# Patient Record
Sex: Female | Born: 1935
Health system: Southern US, Community
[De-identification: ages and names within clinical notes are randomized; demographics above are authoritative.]

## PROBLEM LIST (undated history)

## (undated) DIAGNOSIS — R0601 Orthopnea: Secondary | ICD-10-CM

## (undated) DIAGNOSIS — C801 Malignant (primary) neoplasm, unspecified: Secondary | ICD-10-CM

## (undated) DIAGNOSIS — J45909 Unspecified asthma, uncomplicated: Secondary | ICD-10-CM

## (undated) DIAGNOSIS — I10 Essential (primary) hypertension: Secondary | ICD-10-CM

## (undated) DIAGNOSIS — R42 Dizziness and giddiness: Secondary | ICD-10-CM

## (undated) DIAGNOSIS — M1990 Unspecified osteoarthritis, unspecified site: Secondary | ICD-10-CM

## (undated) DIAGNOSIS — I639 Cerebral infarction, unspecified: Secondary | ICD-10-CM

## (undated) HISTORY — PX: KNEE ARTHROSCOPY: SUR90

## (undated) HISTORY — PX: MELANOMA EXCISION: SHX5266

## (undated) HISTORY — PX: MOUTH SURGERY: SHX715

## (undated) HISTORY — PX: UTERINE SUSPENSION: SUR1430

## (undated) HISTORY — PX: CATARACT EXTRACTION: SUR2

## (undated) HISTORY — PX: REPLACEMENT TOTAL KNEE BILATERAL: SUR1225

---

## 2004-06-21 ENCOUNTER — Ambulatory Visit: Payer: Self-pay | Admitting: Family Medicine

## 2004-07-27 ENCOUNTER — Ambulatory Visit: Payer: Self-pay | Admitting: Family Medicine

## 2005-09-15 ENCOUNTER — Ambulatory Visit: Payer: Self-pay | Admitting: Family Medicine

## 2006-10-10 ENCOUNTER — Ambulatory Visit: Payer: Self-pay | Admitting: Family Medicine

## 2007-11-19 ENCOUNTER — Ambulatory Visit: Payer: Self-pay

## 2007-11-20 ENCOUNTER — Ambulatory Visit: Payer: Self-pay | Admitting: Family Medicine

## 2007-12-06 ENCOUNTER — Ambulatory Visit: Payer: Self-pay | Admitting: General Practice

## 2007-12-06 ENCOUNTER — Other Ambulatory Visit: Payer: Self-pay

## 2007-12-13 ENCOUNTER — Ambulatory Visit: Payer: Self-pay | Admitting: General Practice

## 2008-11-26 ENCOUNTER — Ambulatory Visit: Payer: Self-pay | Admitting: Family Medicine

## 2008-12-09 ENCOUNTER — Ambulatory Visit: Payer: Self-pay | Admitting: Family Medicine

## 2009-06-15 ENCOUNTER — Ambulatory Visit: Payer: Self-pay | Admitting: Family Medicine

## 2010-02-15 IMAGING — US ULTRASOUND LEFT BREAST
1 series · 17 of 23 positions shown · non-contrast
Comparison: none

REASON FOR EXAM: left breast asymmetric nodular density  US
COMMENTS:

[Series 1: ultrasound left breast · 17 of 23 slices shown]
[im 1/23]
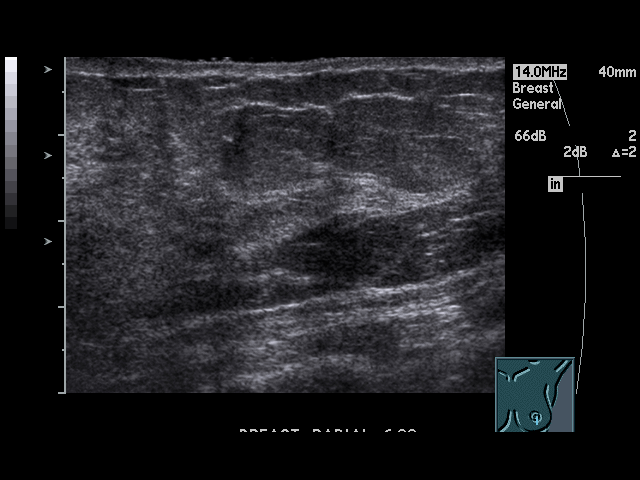
[im 3/23]
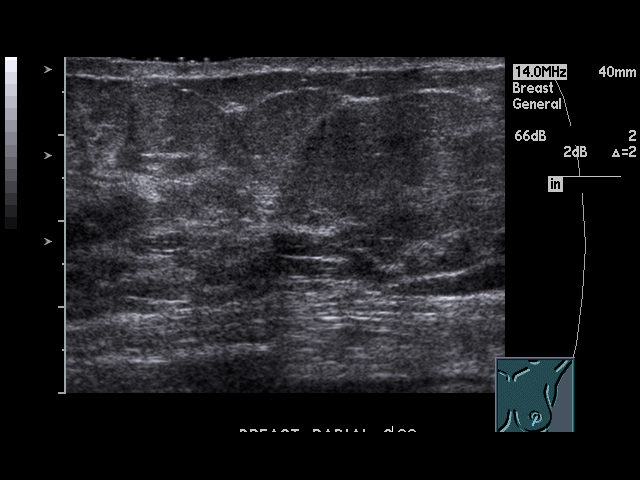
[im 4/23]
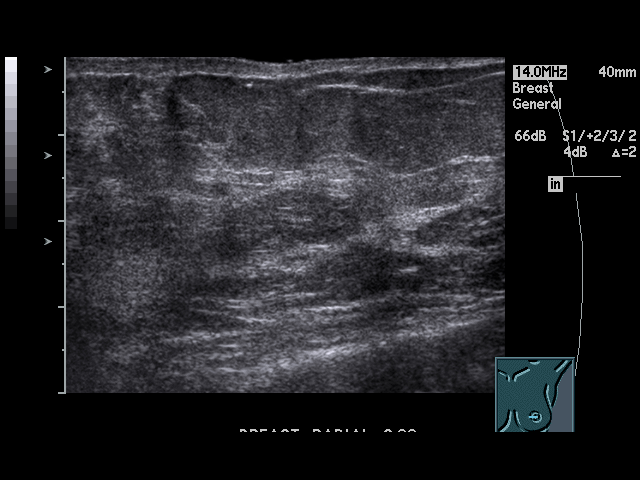
[im 5/23]
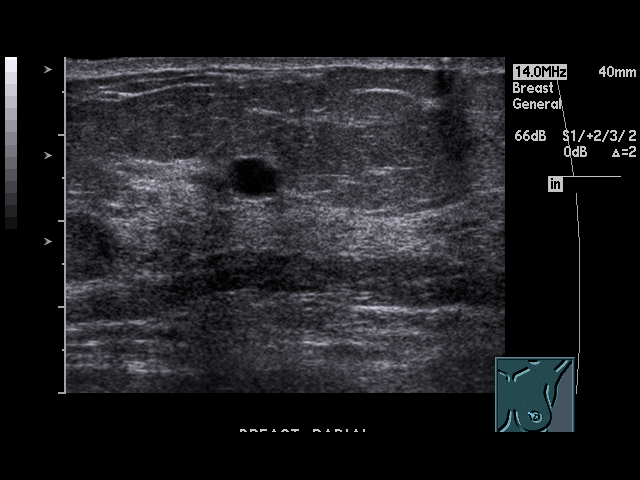
[im 7/23]
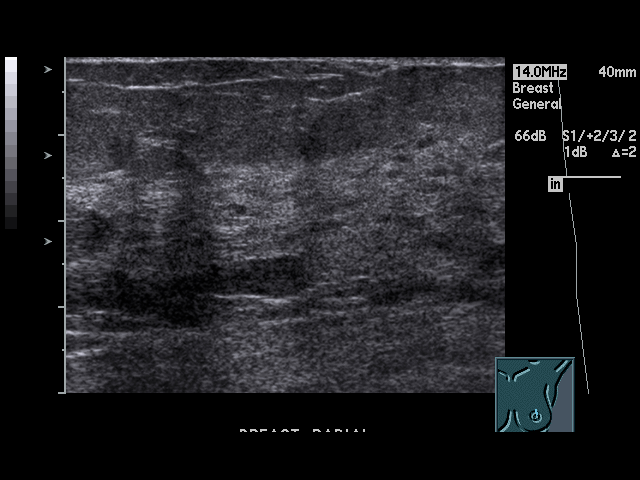
[im 8/23]
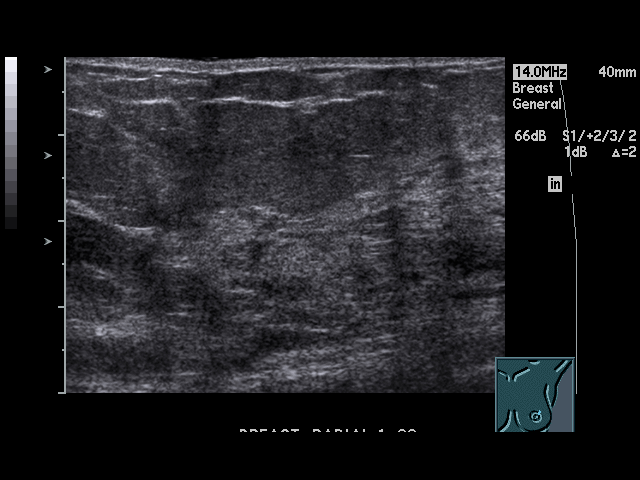
[im 9/23]
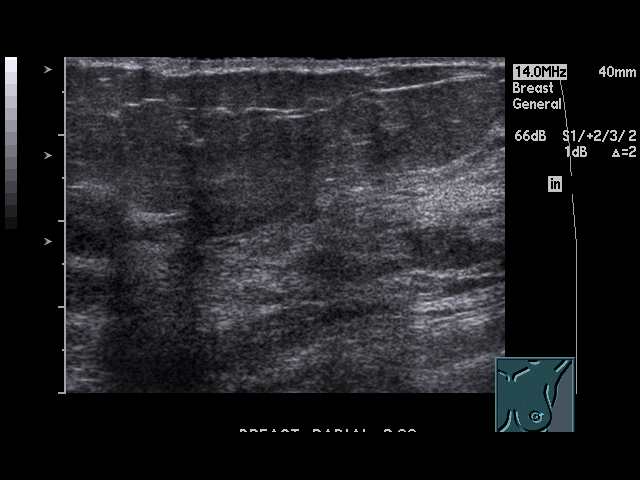
[im 11/23]
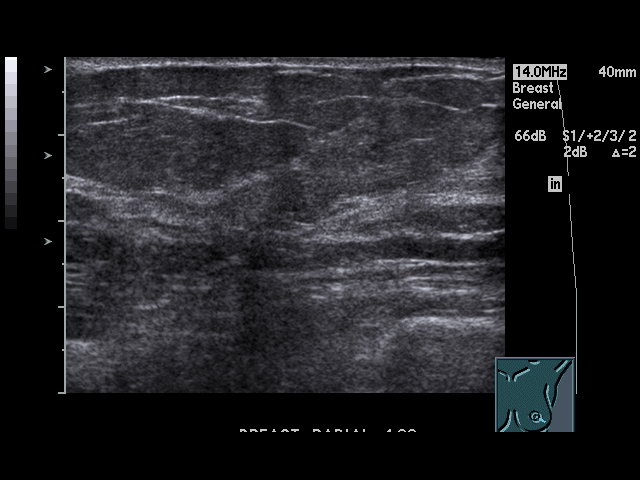
[im 12/23]
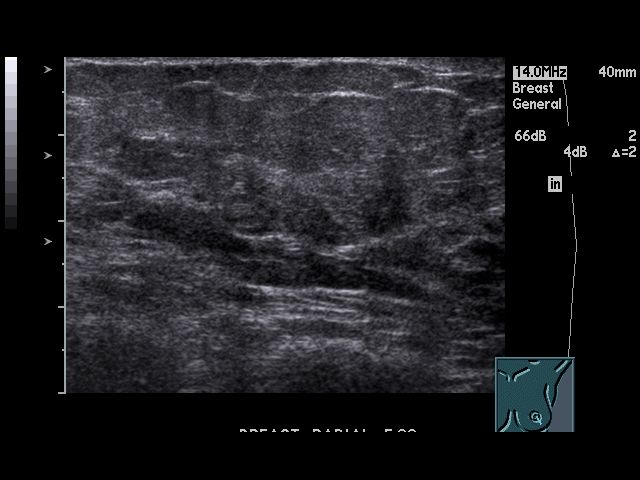
[im 13/23]
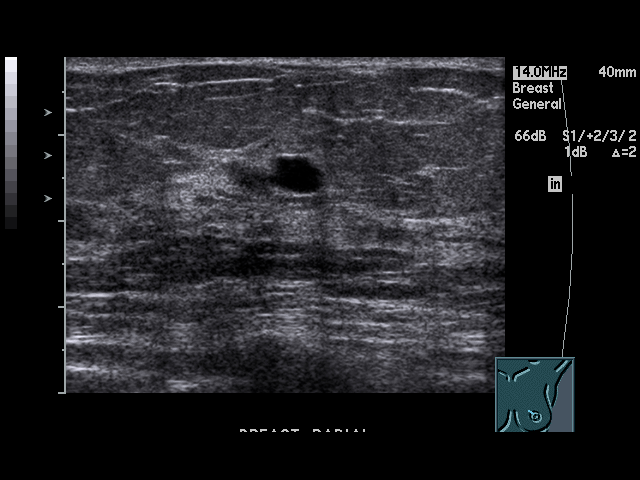
[im 15/23]
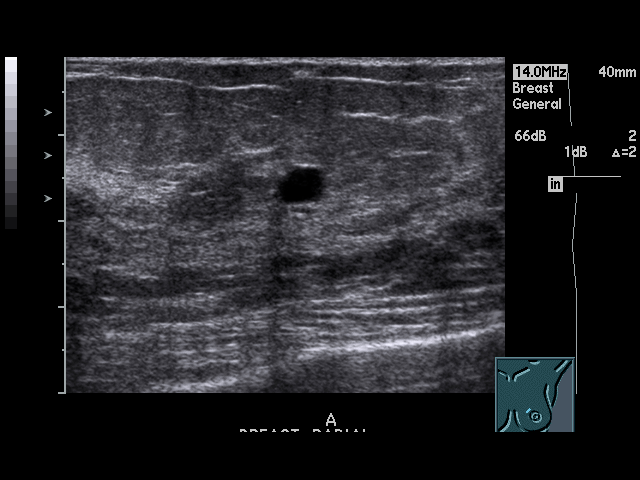
[im 16/23]
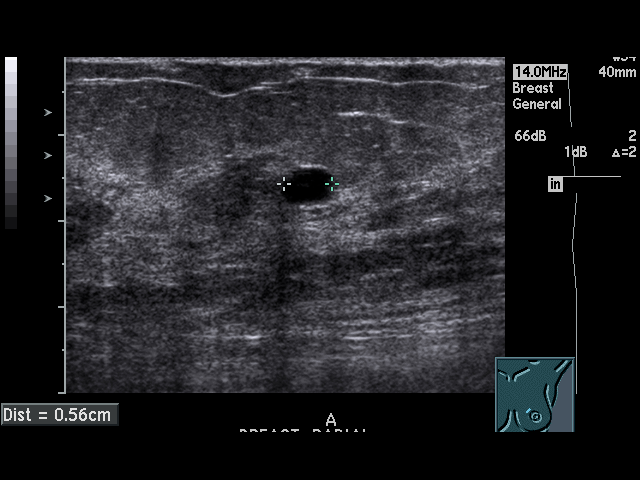
[im 17/23]
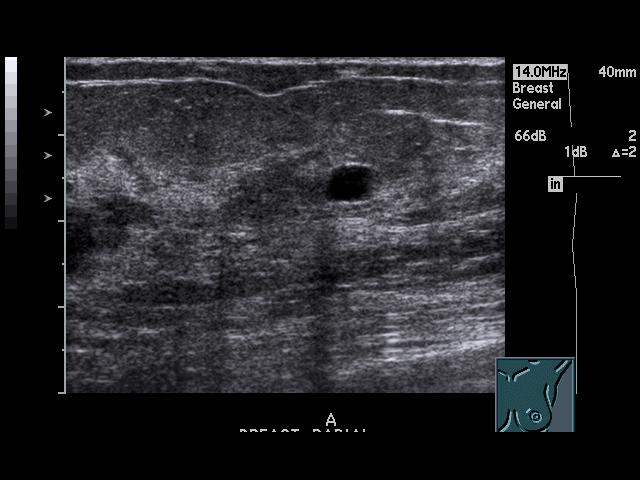
[im 19/23]
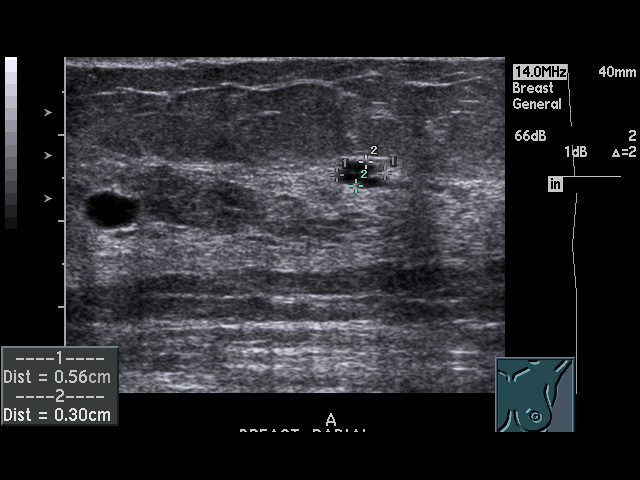
[im 20/23]
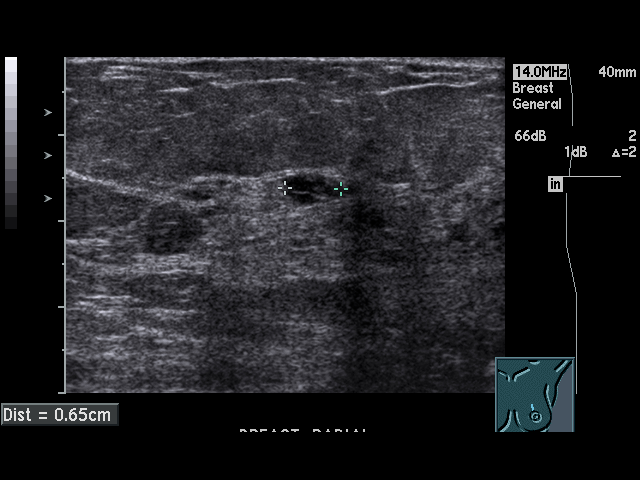
[im 21/23]
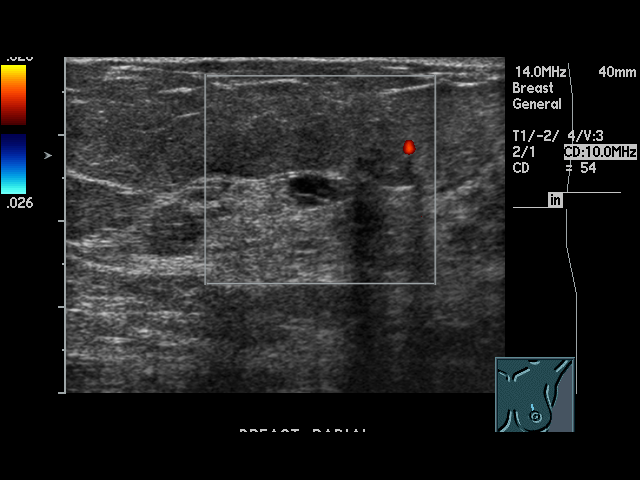
[im 23/23]
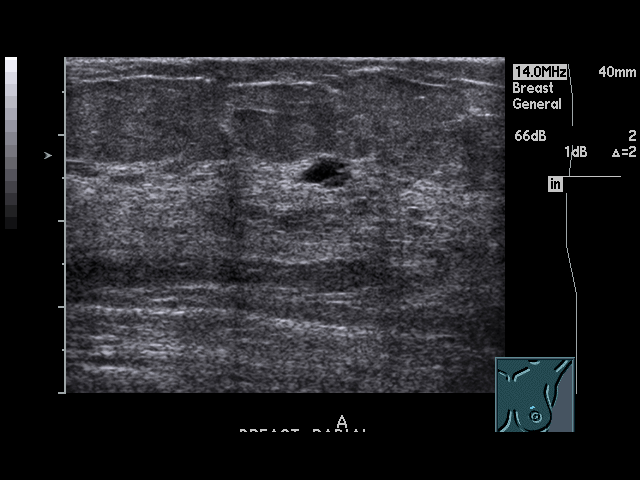

[17 of 23 positions shown; findings below may reference images not displayed]

PROCEDURE:     US  - US BREAST LEFT  - December 09, 2008 [DATE]

RESULT:       The left breast was evaluated in the region of interest from
the periareolar region.

At the 10 o'clock position, a benign-appearing cyst is identified with
increased through transmission and an imperceptible wall measuring 5.7 x
mm. A second indeterminate cystic-appearing nodule is identified at the
10:30/11 o'clock position.  This has increased through transmission and a
regularly shaped wall with a linear area of increased echogenicity.  This
may represent a complex cyst or possibly a septated cyst and a 6-month
re-evaluation at the 10 to 11 o'clock position with ultrasound is
recommended.
IMPRESSION: 1.      Benign cyst at the 12 o'clock position.
2.      Indeterminate likely benign cyst at the 10 to 11 o'clock position.
6-month reevaluation with ultrasound recommended. Please refer to the
additional radiographic view dictation for complete discussion.

## 2010-02-24 ENCOUNTER — Ambulatory Visit: Payer: Self-pay | Admitting: Family Medicine

## 2010-11-15 ENCOUNTER — Ambulatory Visit: Payer: Self-pay | Admitting: Family Medicine

## 2011-04-05 ENCOUNTER — Ambulatory Visit: Payer: Self-pay | Admitting: Family Medicine

## 2011-07-04 ENCOUNTER — Ambulatory Visit: Payer: Self-pay | Admitting: Neurology

## 2011-10-19 ENCOUNTER — Emergency Department: Payer: Self-pay | Admitting: Emergency Medicine

## 2011-10-19 LAB — COMPREHENSIVE METABOLIC PANEL
Albumin: 3.9 g/dL (ref 3.4–5.0)
Anion Gap: 10 (ref 7–16)
Calcium, Total: 8.9 mg/dL (ref 8.5–10.1)
Chloride: 106 mmol/L (ref 98–107)
Co2: 27 mmol/L (ref 21–32)
Creatinine: 0.92 mg/dL (ref 0.60–1.30)
EGFR (African American): >60
Osmolality: 285 (ref 275–301)
Potassium: 3.5 mmol/L (ref 3.5–5.1)
SGOT(AST): 24 U/L (ref 15–37)
Sodium: 143 mmol/L (ref 136–145)

## 2011-10-19 LAB — CBC
HCT: 40.8 % (ref 35.0–47.0)
HGB: 13.8 g/dL (ref 12.0–16.0)
MCH: 30 pg (ref 26.0–34.0)
MCHC: 33.8 g/dL (ref 32.0–36.0)
Platelet: 182 10*3/uL (ref 150–440)
RDW: 12.5 % (ref 11.5–14.5)

## 2011-10-31 ENCOUNTER — Ambulatory Visit: Payer: Self-pay | Admitting: Neurology

## 2012-01-30 ENCOUNTER — Ambulatory Visit: Payer: Self-pay | Admitting: General Practice

## 2012-01-30 LAB — PROTIME-INR
INR: 0.9
Prothrombin Time: 12.9 secs (ref 11.5–14.7)

## 2012-01-30 LAB — URINALYSIS, COMPLETE
Ketone: NEGATIVE
Nitrite: NEGATIVE
Ph: 5 (ref 4.5–8.0)
Protein: NEGATIVE
RBC,UR: 1 /HPF (ref 0–5)
Specific Gravity: 1.015 (ref 1.003–1.030)
WBC UR: 5 /HPF (ref 0–5)

## 2012-01-30 LAB — BASIC METABOLIC PANEL
BUN: 31 mg/dL — ABNORMAL HIGH (ref 7–18)
Chloride: 104 mmol/L (ref 98–107)
Co2: 29 mmol/L (ref 21–32)
Creatinine: 1.47 mg/dL — ABNORMAL HIGH (ref 0.60–1.30)
Glucose: 95 mg/dL (ref 65–99)
Osmolality: 284 (ref 275–301)
Potassium: 3.2 mmol/L — ABNORMAL LOW (ref 3.5–5.1)
Sodium: 139 mmol/L (ref 136–145)

## 2012-01-30 LAB — CBC
HCT: 37.7 % (ref 35.0–47.0)
Platelet: 191 10*3/uL (ref 150–440)
RDW: 13 % (ref 11.5–14.5)
WBC: 6.4 10*3/uL (ref 3.6–11.0)

## 2012-01-30 LAB — MRSA PCR SCREENING

## 2012-01-30 LAB — SEDIMENTATION RATE: Erythrocyte Sed Rate: 39 mm/hr — ABNORMAL HIGH (ref 0–30)

## 2012-02-09 ENCOUNTER — Ambulatory Visit: Payer: Self-pay | Admitting: Family Medicine

## 2012-02-13 ENCOUNTER — Inpatient Hospital Stay: Payer: Self-pay

## 2012-02-13 LAB — POTASSIUM: Potassium: 3.2 mmol/L — ABNORMAL LOW (ref 3.5–5.1)

## 2012-02-14 LAB — PLATELET COUNT: Platelet: 142 10*3/uL — ABNORMAL LOW (ref 150–440)

## 2012-02-14 LAB — BASIC METABOLIC PANEL
Anion Gap: 9 (ref 7–16)
BUN: 15 mg/dL (ref 7–18)
Chloride: 102 mmol/L (ref 98–107)
Creatinine: 1.1 mg/dL (ref 0.60–1.30)
EGFR (Non-African Amer.): 49 — ABNORMAL LOW
Glucose: 147 mg/dL — ABNORMAL HIGH (ref 65–99)
Osmolality: 281 (ref 275–301)
Potassium: 3.4 mmol/L — ABNORMAL LOW (ref 3.5–5.1)
Sodium: 139 mmol/L (ref 136–145)

## 2012-02-15 LAB — BASIC METABOLIC PANEL
Calcium, Total: 8.1 mg/dL — ABNORMAL LOW (ref 8.5–10.1)
Chloride: 105 mmol/L (ref 98–107)
Co2: 26 mmol/L (ref 21–32)
Creatinine: 1.09 mg/dL (ref 0.60–1.30)
EGFR (Non-African Amer.): 49 — ABNORMAL LOW
Glucose: 129 mg/dL — ABNORMAL HIGH (ref 65–99)
Osmolality: 283 (ref 275–301)
Potassium: 3.7 mmol/L (ref 3.5–5.1)
Sodium: 141 mmol/L (ref 136–145)

## 2012-02-15 LAB — PLATELET COUNT: Platelet: 140 10*3/uL — ABNORMAL LOW (ref 150–440)

## 2012-02-15 LAB — HEMOGLOBIN: HGB: 11 g/dL — ABNORMAL LOW (ref 12.0–16.0)

## 2012-02-17 ENCOUNTER — Encounter: Payer: Self-pay | Admitting: Internal Medicine

## 2012-02-23 LAB — BASIC METABOLIC PANEL
Calcium, Total: 9.3 mg/dL (ref 8.5–10.1)
Chloride: 101 mmol/L (ref 98–107)
Co2: 31 mmol/L (ref 21–32)
Creatinine: 1.2 mg/dL (ref 0.60–1.30)
EGFR (Non-African Amer.): 44 — ABNORMAL LOW
Glucose: 87 mg/dL (ref 65–99)
Potassium: 3.3 mmol/L — ABNORMAL LOW (ref 3.5–5.1)
Sodium: 143 mmol/L (ref 136–145)

## 2012-03-01 ENCOUNTER — Encounter: Payer: Self-pay | Admitting: Internal Medicine

## 2013-05-14 ENCOUNTER — Ambulatory Visit: Payer: Self-pay | Admitting: Family Medicine

## 2013-06-12 ENCOUNTER — Ambulatory Visit: Payer: Self-pay | Admitting: Family Medicine

## 2014-01-01 ENCOUNTER — Ambulatory Visit: Payer: Self-pay | Admitting: Ophthalmology

## 2014-07-11 LAB — LIPID PANEL
Cholesterol: 179 mg/dL (ref 0–200)
HDL: 34 mg/dL — AB (ref 35–70)
LDL Cholesterol: 91 mg/dL
LDl/HDL Ratio: 2.7
TRIGLYCERIDES: 270 mg/dL — AB (ref 40–160)

## 2014-07-11 LAB — CBC AND DIFFERENTIAL
HCT: 41 % (ref 36–46)
HEMOGLOBIN: 13.7 g/dL (ref 12.0–16.0)
Neutrophils Absolute: 4 /uL
Platelets: 189 10*3/uL (ref 150–399)
WBC: 7 10*3/mL

## 2014-07-11 LAB — BASIC METABOLIC PANEL
BUN: 21 mg/dL (ref 4–21)
CREATININE: 1.2 mg/dL — AB (ref 0.5–1.1)
Glucose: 88 mg/dL
POTASSIUM: 3.7 mmol/L (ref 3.4–5.3)
Sodium: 144 mmol/L (ref 137–147)

## 2014-07-11 LAB — HEPATIC FUNCTION PANEL
ALK PHOS: 82 U/L (ref 25–125)
ALT: 16 U/L (ref 7–35)
AST: 18 U/L (ref 13–35)
BILIRUBIN, TOTAL: 0.4 mg/dL

## 2014-07-16 ENCOUNTER — Ambulatory Visit: Payer: Self-pay | Admitting: General Practice

## 2014-07-16 LAB — URINALYSIS, COMPLETE
BILIRUBIN, UR: NEGATIVE
Bacteria: NONE SEEN
Blood: NEGATIVE
GLUCOSE, UR: NEGATIVE mg/dL (ref 0–75)
KETONE: NEGATIVE
Nitrite: POSITIVE
PROTEIN: NEGATIVE
Ph: 5 (ref 4.5–8.0)
RBC,UR: 2 /HPF (ref 0–5)
Specific Gravity: 1.019 (ref 1.003–1.030)

## 2014-07-16 LAB — CBC
HCT: 42.8 % (ref 35.0–47.0)
HGB: 13.9 g/dL (ref 12.0–16.0)
MCH: 28.9 pg (ref 26.0–34.0)
MCHC: 32.5 g/dL (ref 32.0–36.0)
MCV: 89 fL (ref 80–100)
Platelet: 179 10*3/uL (ref 150–440)
RBC: 4.83 10*6/uL (ref 3.80–5.20)
RDW: 13.3 % (ref 11.5–14.5)
WBC: 7.2 10*3/uL (ref 3.6–11.0)

## 2014-07-16 LAB — APTT: ACTIVATED PTT: 28.6 s (ref 23.6–35.9)

## 2014-07-16 LAB — BASIC METABOLIC PANEL
Anion Gap: 7 (ref 7–16)
BUN: 17 mg/dL (ref 7–18)
CALCIUM: 9.4 mg/dL (ref 8.5–10.1)
Chloride: 104 mmol/L (ref 98–107)
Co2: 30 mmol/L (ref 21–32)
Creatinine: 1.14 mg/dL (ref 0.60–1.30)
EGFR (African American): 59 — ABNORMAL LOW
EGFR (Non-African Amer.): 49 — ABNORMAL LOW
Glucose: 95 mg/dL (ref 65–99)
Osmolality: 283 (ref 275–301)
Potassium: 3.5 mmol/L (ref 3.5–5.1)
Sodium: 141 mmol/L (ref 136–145)

## 2014-07-16 LAB — PROTIME-INR
INR: 1
Prothrombin Time: 12.6 secs (ref 11.5–14.7)

## 2014-07-16 LAB — MRSA PCR SCREENING

## 2014-07-16 LAB — SEDIMENTATION RATE: Erythrocyte Sed Rate: 28 mm/hr (ref 0–30)

## 2014-07-18 LAB — URINE CULTURE

## 2014-07-21 IMAGING — MG MM DIGITAL SCREENING BILAT W/ CAD
1 series · 6 of 6 positions shown · non-contrast
Comparison: Previous exam(s).

REASON FOR EXAM: SCR MAMMO NO ORDER
COMMENTS:

PROCEDURE:     MAM - MAM DGTL SCRN MAM NO ORDER W/CAD  - May 14, 2013 [DATE]
CLINICAL DATA: Screening.
EXAM:
DIGITAL SCREENING BILATERAL MAMMOGRAM WITH CAD

[R CC · right · 6 of 6 slices shown]
[im 1/6]
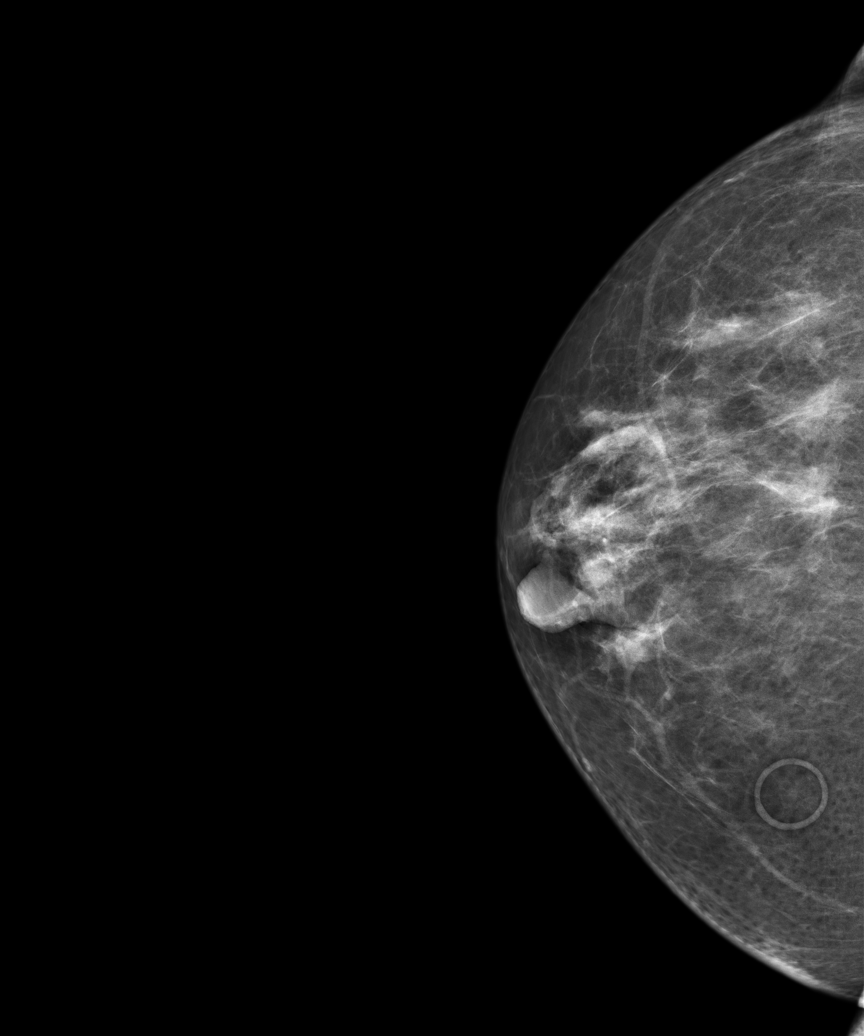
[im 2/6]
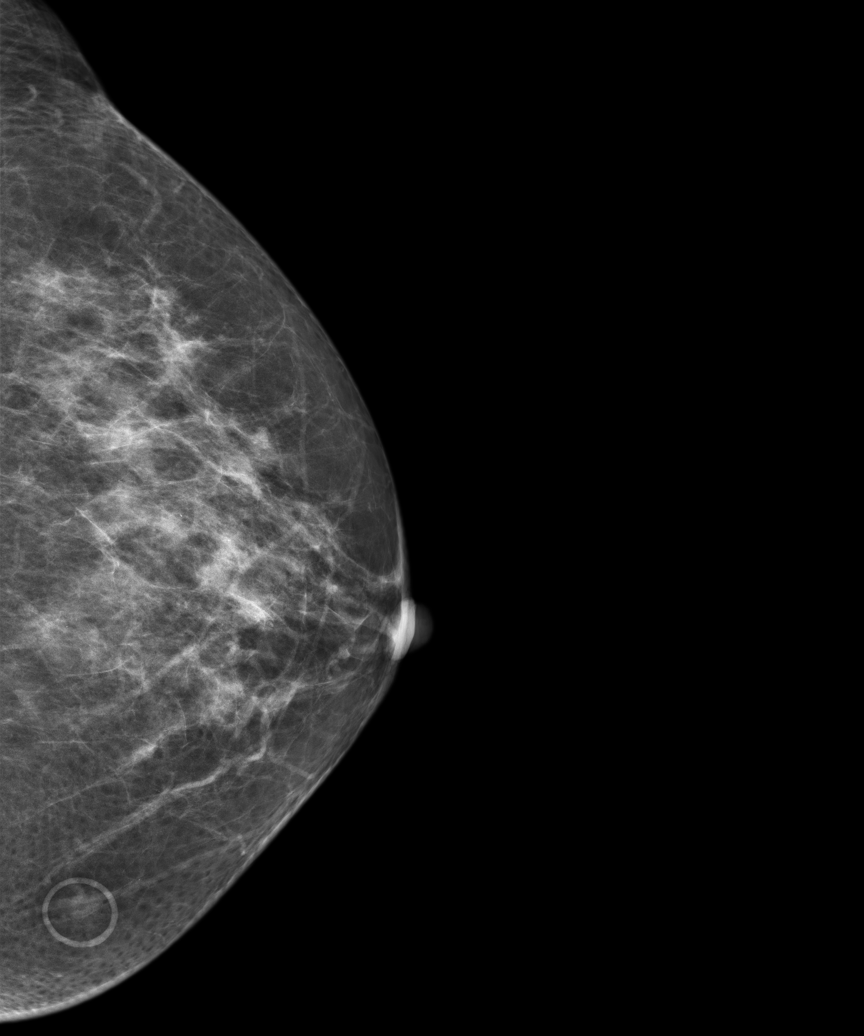
[im 3/6]
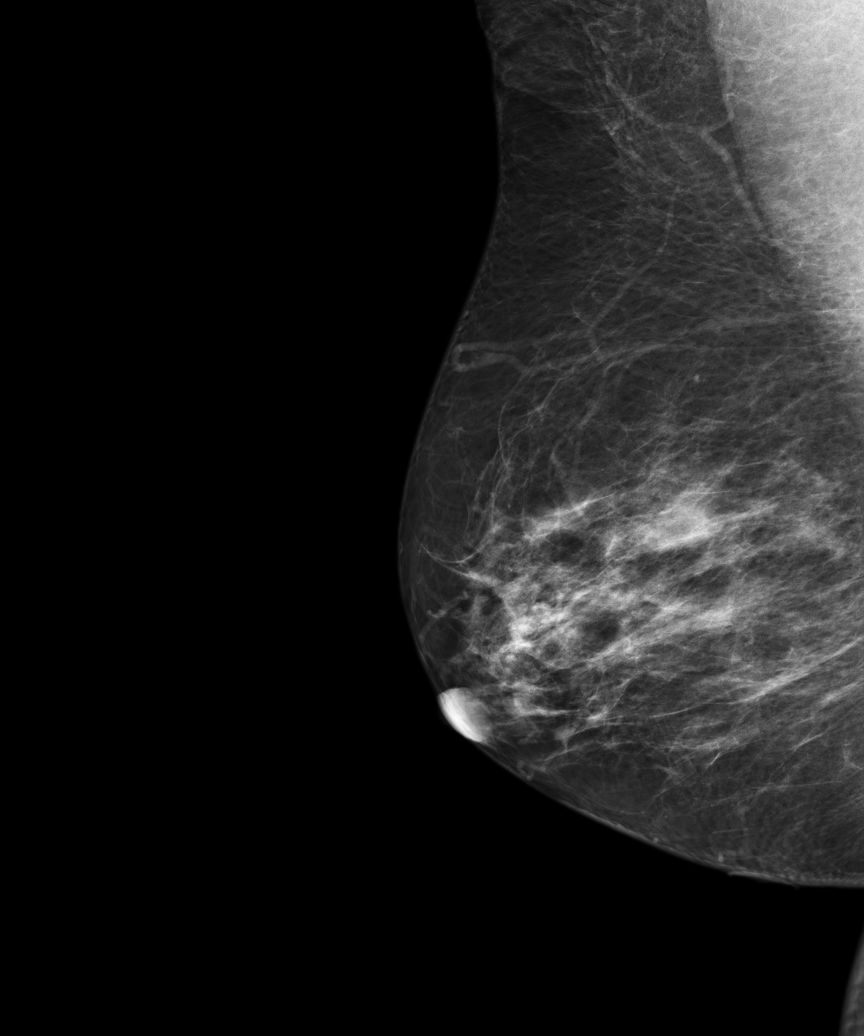
[im 4/6]
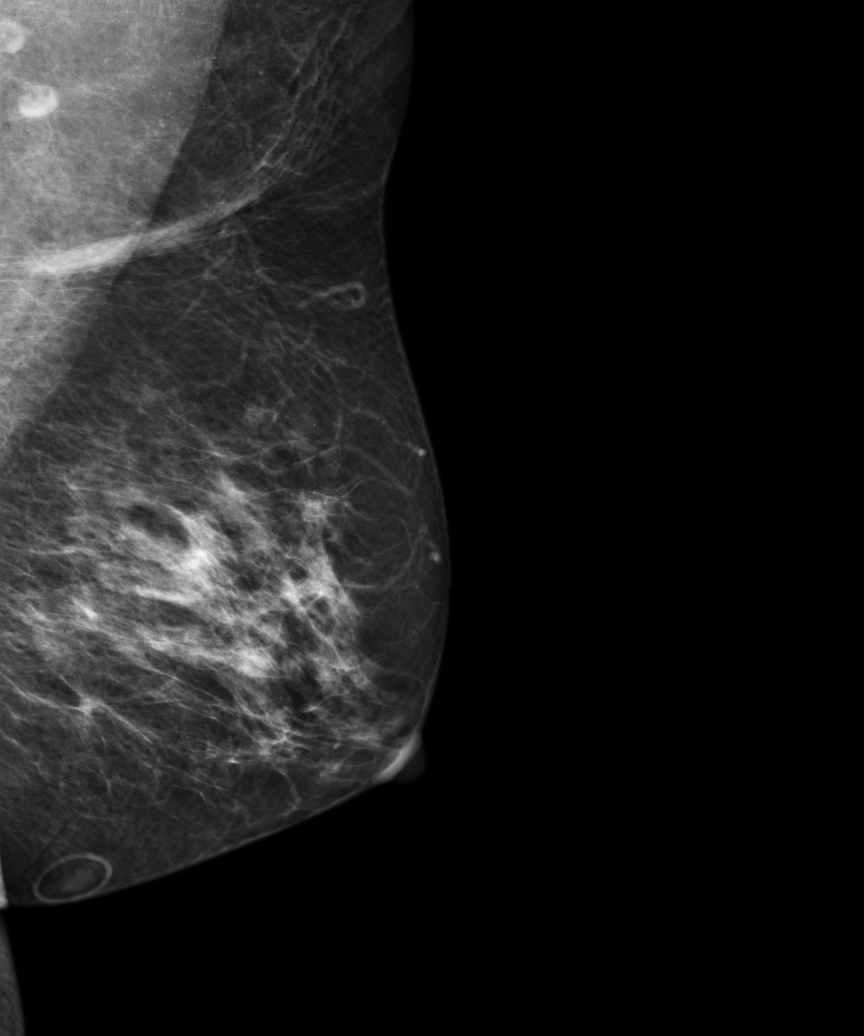
[im 5/6]
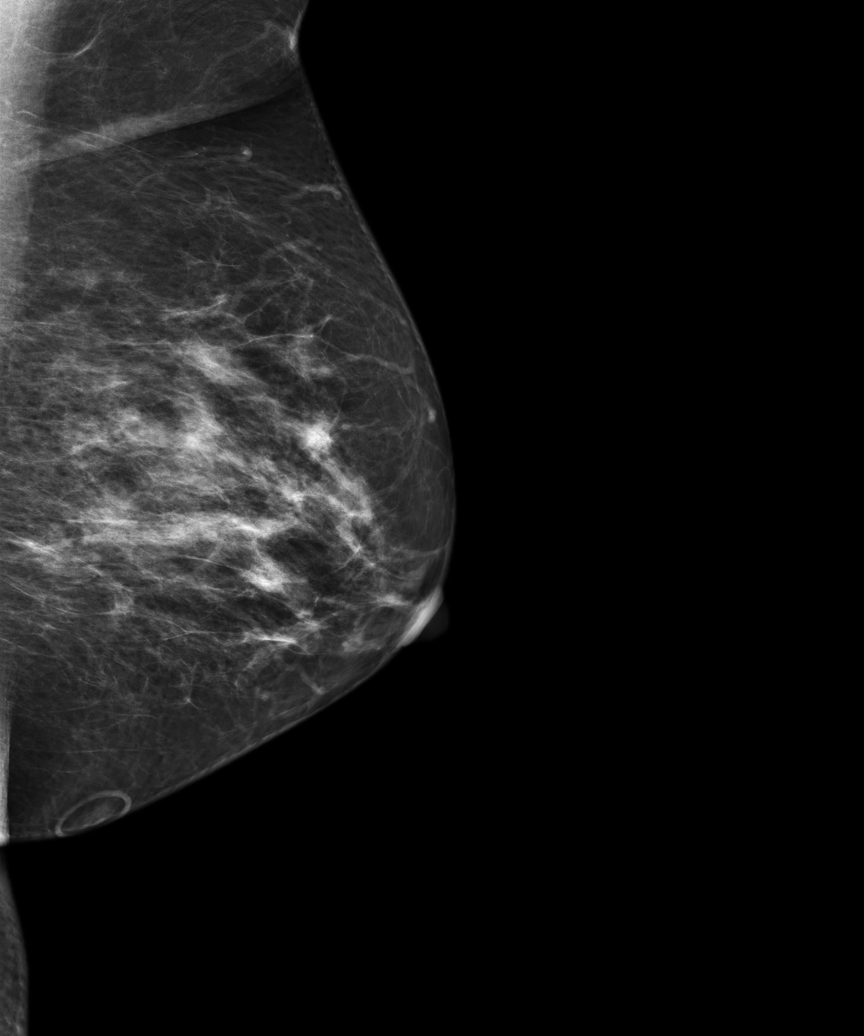
[im 6/6]
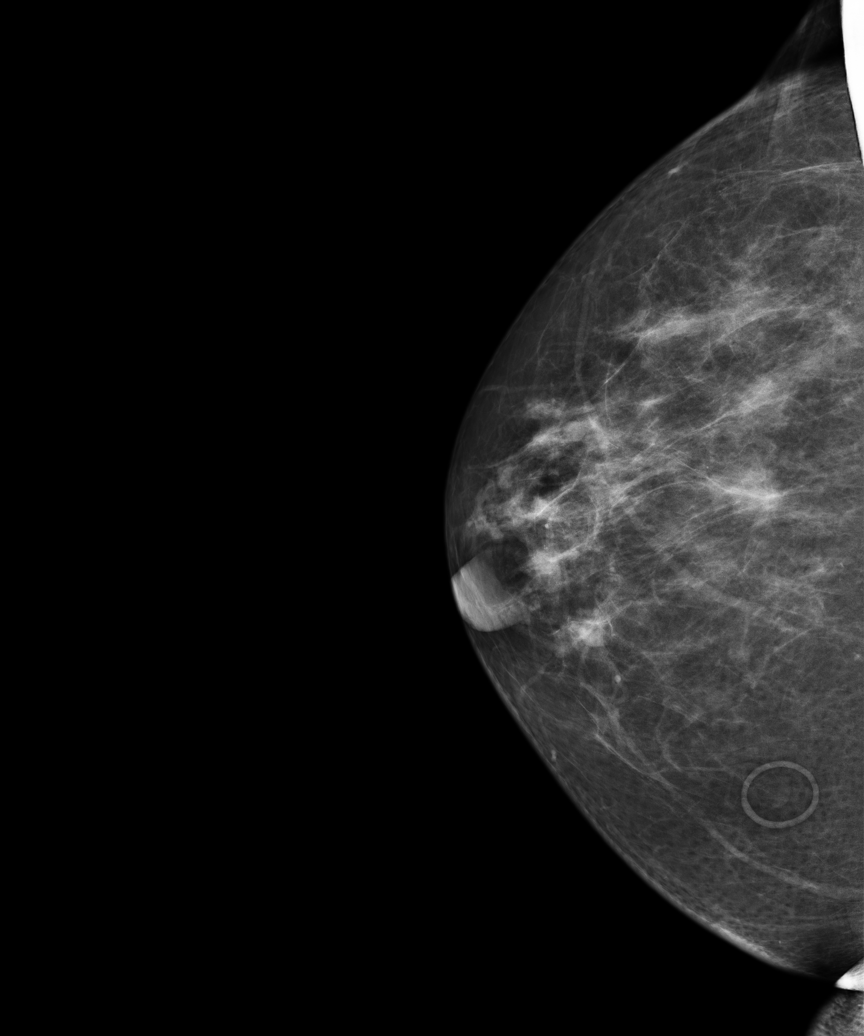

[6 of 6 positions shown; findings below may reference images not displayed]

ACR Breast Density Category c: The breasts are heterogeneously
dense, which may obscure small masses.
FINDINGS: In the left breast, a possible asymmetry warrants further evaluation
with spot compression views and possibly ultrasound. In the right
breast, no suspicious masses or malignant type calcifications are
identified. Images were processed with CAD.
IMPRESSION: Further evaluation is suggested for possible asymmetry in the left
breast.

RECOMMENDATION:
Diagnostic mammogram and possibly ultrasound of the left breast.
(Code:[id number]-EEM)

The patient will be contacted regarding the findings, and additional
imaging will be scheduled.

BI-RADS CATEGORY  0: Incomplete. Need additional imaging evaluation
and/or prior mammograms for comparison.

## 2014-07-23 ENCOUNTER — Ambulatory Visit: Payer: Self-pay | Admitting: Family Medicine

## 2014-07-28 ENCOUNTER — Inpatient Hospital Stay: Payer: Self-pay | Admitting: General Practice

## 2014-07-29 LAB — BASIC METABOLIC PANEL
Anion Gap: 5 — ABNORMAL LOW (ref 7–16)
BUN: 12 mg/dL (ref 7–18)
Calcium, Total: 8.3 mg/dL — ABNORMAL LOW (ref 8.5–10.1)
Chloride: 106 mmol/L (ref 98–107)
Co2: 26 mmol/L (ref 21–32)
Creatinine: 0.97 mg/dL (ref 0.60–1.30)
EGFR (Non-African Amer.): 59 — ABNORMAL LOW
GLUCOSE: 116 mg/dL — AB (ref 65–99)
Osmolality: 275 (ref 275–301)
Potassium: 4.7 mmol/L (ref 3.5–5.1)
Sodium: 137 mmol/L (ref 136–145)

## 2014-07-29 LAB — HEMOGLOBIN: HGB: 12.2 g/dL (ref 12.0–16.0)

## 2014-07-29 LAB — PLATELET COUNT: PLATELETS: 180 10*3/uL (ref 150–440)

## 2014-07-30 ENCOUNTER — Encounter: Payer: Self-pay | Admitting: Internal Medicine

## 2014-07-30 LAB — BASIC METABOLIC PANEL
ANION GAP: 5 — AB (ref 7–16)
BUN: 8 mg/dL (ref 7–18)
CO2: 30 mmol/L (ref 21–32)
CREATININE: 1.04 mg/dL (ref 0.60–1.30)
Calcium, Total: 8.5 mg/dL (ref 8.5–10.1)
Chloride: 104 mmol/L (ref 98–107)
EGFR (Non-African Amer.): 54 — ABNORMAL LOW
Glucose: 101 mg/dL — ABNORMAL HIGH (ref 65–99)
Osmolality: 276 (ref 275–301)
Potassium: 4 mmol/L (ref 3.5–5.1)
Sodium: 139 mmol/L (ref 136–145)

## 2014-07-30 LAB — HEMOGLOBIN: HGB: 11.8 g/dL — ABNORMAL LOW (ref 12.0–16.0)

## 2014-07-30 LAB — PLATELET COUNT: PLATELETS: 156 10*3/uL (ref 150–440)

## 2014-08-01 ENCOUNTER — Encounter: Payer: Self-pay | Admitting: Internal Medicine

## 2014-08-01 DIAGNOSIS — R2689 Other abnormalities of gait and mobility: Secondary | ICD-10-CM | POA: Diagnosis not present

## 2014-08-01 DIAGNOSIS — M6281 Muscle weakness (generalized): Secondary | ICD-10-CM | POA: Diagnosis not present

## 2014-08-01 DIAGNOSIS — I10 Essential (primary) hypertension: Secondary | ICD-10-CM | POA: Diagnosis not present

## 2014-08-01 DIAGNOSIS — K210 Gastro-esophageal reflux disease with esophagitis: Secondary | ICD-10-CM | POA: Diagnosis not present

## 2014-08-01 DIAGNOSIS — J45909 Unspecified asthma, uncomplicated: Secondary | ICD-10-CM | POA: Diagnosis not present

## 2014-08-01 DIAGNOSIS — Z96652 Presence of left artificial knee joint: Secondary | ICD-10-CM | POA: Diagnosis not present

## 2014-08-01 DIAGNOSIS — M1712 Unilateral primary osteoarthritis, left knee: Secondary | ICD-10-CM | POA: Diagnosis not present

## 2014-08-01 DIAGNOSIS — E785 Hyperlipidemia, unspecified: Secondary | ICD-10-CM | POA: Diagnosis not present

## 2014-08-21 DIAGNOSIS — Z96652 Presence of left artificial knee joint: Secondary | ICD-10-CM | POA: Diagnosis not present

## 2014-08-21 DIAGNOSIS — K210 Gastro-esophageal reflux disease with esophagitis: Secondary | ICD-10-CM | POA: Diagnosis not present

## 2014-08-27 DIAGNOSIS — Z96652 Presence of left artificial knee joint: Secondary | ICD-10-CM | POA: Diagnosis not present

## 2014-08-29 DIAGNOSIS — Z96652 Presence of left artificial knee joint: Secondary | ICD-10-CM | POA: Diagnosis not present

## 2014-09-01 ENCOUNTER — Encounter: Payer: Self-pay | Admitting: Internal Medicine

## 2014-09-01 DIAGNOSIS — Z96652 Presence of left artificial knee joint: Secondary | ICD-10-CM | POA: Diagnosis not present

## 2014-09-03 DIAGNOSIS — Z96652 Presence of left artificial knee joint: Secondary | ICD-10-CM | POA: Diagnosis not present

## 2014-09-05 DIAGNOSIS — Z96652 Presence of left artificial knee joint: Secondary | ICD-10-CM | POA: Diagnosis not present

## 2014-09-08 DIAGNOSIS — Z96652 Presence of left artificial knee joint: Secondary | ICD-10-CM | POA: Diagnosis not present

## 2014-09-09 DIAGNOSIS — Z96652 Presence of left artificial knee joint: Secondary | ICD-10-CM | POA: Diagnosis not present

## 2014-09-10 DIAGNOSIS — Z96652 Presence of left artificial knee joint: Secondary | ICD-10-CM | POA: Diagnosis not present

## 2014-09-17 DIAGNOSIS — Z96652 Presence of left artificial knee joint: Secondary | ICD-10-CM | POA: Diagnosis not present

## 2014-09-19 DIAGNOSIS — Z96652 Presence of left artificial knee joint: Secondary | ICD-10-CM | POA: Diagnosis not present

## 2014-09-22 DIAGNOSIS — Z96652 Presence of left artificial knee joint: Secondary | ICD-10-CM | POA: Diagnosis not present

## 2014-09-24 DIAGNOSIS — Z96652 Presence of left artificial knee joint: Secondary | ICD-10-CM | POA: Diagnosis not present

## 2014-09-26 DIAGNOSIS — Z96652 Presence of left artificial knee joint: Secondary | ICD-10-CM | POA: Diagnosis not present

## 2014-09-29 DIAGNOSIS — Z96652 Presence of left artificial knee joint: Secondary | ICD-10-CM | POA: Diagnosis not present

## 2014-09-30 ENCOUNTER — Encounter
Admit: 2014-09-30 | Disposition: A | Discharge status: Home / Self Care | Payer: Self-pay | Attending: Internal Medicine | Admitting: Internal Medicine

## 2014-10-03 DIAGNOSIS — Z96652 Presence of left artificial knee joint: Secondary | ICD-10-CM | POA: Diagnosis not present

## 2014-10-06 DIAGNOSIS — Z96652 Presence of left artificial knee joint: Secondary | ICD-10-CM | POA: Diagnosis not present

## 2014-10-08 DIAGNOSIS — Z96652 Presence of left artificial knee joint: Secondary | ICD-10-CM | POA: Diagnosis not present

## 2014-10-10 DIAGNOSIS — Z96652 Presence of left artificial knee joint: Secondary | ICD-10-CM | POA: Diagnosis not present

## 2014-10-13 DIAGNOSIS — Z96652 Presence of left artificial knee joint: Secondary | ICD-10-CM | POA: Diagnosis not present

## 2014-10-31 ENCOUNTER — Encounter
Admit: 2014-10-31 | Disposition: A | Discharge status: Home / Self Care | Payer: Self-pay | Attending: Internal Medicine | Admitting: Internal Medicine

## 2014-11-18 NOTE — Discharge Summary (Signed)
PATIENT NAME:  Susan Campos, Susan Campos MR#:  [id number] DATE OF BIRTH:  1932-09-19  DATE OF ADMISSION:  02/13/2012 DATE OF DISCHARGE:  02/16/2012  ADMITTING DIAGNOSIS: Degenerative arthrosis of the right knee.   DISCHARGE DIAGNOSIS: Degenerative arthrosis of the right knee.   HISTORY: The patient is a pleasant 81 year old who has been followed at [facility] for progression of right knee pain. The patient had reported at least a one-year history of persistent right knee pain. She had localized most of the pain along the medial aspect of the knee. Her pain was aggravated with weight-bearing activities as well as stair ambulation. At the time of surgery, she was using ambulatory aid for assistance. The patient had denied any gross locking or giving way of the knee. She had on occasion had some swelling of the knee. The patient states that she had not seen any significant improvement in her condition despite activity modification, intra-articular cortisone injections, as well as physical therapy. The pain had increased to the point that it was significantly interfering with her activities of daily living. X-rays taken in the clinic showed bone-on-bone to the medial compartment space with osteophyte as well as subchondral sclerosis. After discussion of the risks and benefits of surgical intervention, the patient expressed her understanding of the risks and benefits and agreed with plans for surgical intervention.   PROCEDURE: Right total knee arthroplasty using computer-assisted navigation.   ANESTHESIA: Femoral nerve block with spinal.   SOFT TISSUE RELEASE: Anterior cruciate ligament, posterior cruciate ligament, deep medial collateral ligament as well as the patellofemoral ligament.   IMPLANTS UTILIZED: DePuy PFC Sigma size four narrow posterior stabilized femoral component (cemented), size three MBT tibial component (cemented), 35-mm three pegged oval dome patella (cemented), and a 10-mm stabilized  rotating platform polyethylene insert.   HOSPITAL COURSE: The patient tolerated the procedure very well. She had no complications. She was then taken to the PAC-U where she was stabilized and then transferred to the orthopedic floor. The patient began receiving anticoagulation therapy of Lovenox 30 mg subcutaneous every 12 hours per anesthesia and pharmacy protocol. She was fitted with TED stockings bilaterally. These were allowed to be removed one hour per eight-hour shift. The right one was applied on day two following removal of the Hemovac and dressing change. The patient was also fitted with the AV-I compression foot pumps bilaterally set at 80 mmHg. Her calves have been nontender. There has been no evidence of any deep venous thrombosis to the lower extremity. Heels were elevated off the bed using rolled towels.   The patient has denied any chest pain or shortness of breath. Vital signs have been stable. She has been afebrile. Hemodynamically she was stable. No transfusions were given other than the Autovac transfusion given the first six hours postoperatively. Laboratory studies did show hypokalemia prior to surgery of 3.2. She was given potassium and this increased to 3.4 the day of surgery, the day after surgery, and upon being discharged had improved to a normal level of 3.7. She was asymptomatic during this time frame. Otherwise, her laboratory studies have been within normal limits.   Physical therapy was initiated on day one for gait training and transfers. She has done extremely well. Upon being discharged she was ambulating at 80 feet. She had full extension. Flexion was 82 degrees. Occupational therapy was also initiated on day one for activities of daily living and assistive devices.   The patient's IV, Foley, and Hemovac were all discontinued on day two along with a  dressing change. The wound was free of any drainage or signs of infection. Polar Care was reapplied to the surgical leg  maintaining a temperature of 40 to 50 degrees Fahrenheit.   DISPOSITION: The patient is being discharged to skilled nursing facility in improved stable condition.   DISCHARGE INSTRUCTIONS:  1. She may weight bear as tolerated.  2. She will need to continue using the knee immobilizer until she is able to do 10 straight leg raises on her own.  3. She will receive physical therapy for gait training and transfers, occupational therapy for activities of daily living and assistive devices.  4. Continue TED stockings. These are allowed to be removed one hour per eight-hour shift.  5. Incentive spirometry every hour while awake.  6. Encourage cough and deep breathing every two hours while awake.  7. Polar Care to the surgical leg maintaining a temperature of 40 to 50 degrees Fahrenheit.  8. She is placed on a regular diet.  9. She has a follow-up appointment in the clinic on 07/30 at 10:15 with Vance Peper, PA and with Dr. Marry Guan on 08/27 at 11:15.  10. She is to call the clinic sooner if any temperatures of 101.5 or greater or excessive bleeding.   DRUG ALLERGIES: Lovastatin.   MEDICATIONS:  1. Tylenol ES 500 to 1000 mg every four hours p.r.n.  2. Celebrex 200 mg b.i.d.  3. Roxicodone 5 to 10 mg every four hours p.r.n. 4. Ultram 50 to 100 mg every four hours p.r.n.  5. Dulcolax suppositories 10 mg rectally daily p.r.n. for constipation.  6. Milk of Magnesia 30 mL b.i.d. p.r.n.  7. Mylanta DS 30 mL every six hours p.r.n.  8. Pantoprazole 40 mg b.i.d.  9. Senokot-S 1 tablet b.i.d.  10. Hydrochlorothiazide/triamterene 25/37.5, 1 tablet daily.  11. Lovenox 40 mg subcutaneously q.12 hours for 14 days, then discontinue and begin taking one 81-mg enteric-coated aspirin per day.   PAST MEDICAL HISTORY:  1. Asthma.  2. Melanoma. 3. Stroke in 1999. 4. Stroke/transient ischemic attack 03/29/2011. 5. Chickenpox.  6. Hypercholesterolemia.  7. Hypertension.    ____________________________ Vance Peper, PA [id number] D: 02/16/2012 07:14:31 ET T: 02/16/2012 07:53:13 ET JOB#: [id number]  cc: Vance Peper, PA, <Dictator> JON WOLFE PA ELECTRONICALLY SIGNED 02/16/2012 9:20

## 2014-11-18 NOTE — Op Note (Signed)
PATIENT NAME:  Susan Campos, Susan Campos MR#:  [id number] DATE OF BIRTH:  10/23/1932  DATE OF PROCEDURE:  02/13/2012  PREOPERATIVE DIAGNOSIS: Degenerative arthrosis of the right knee.   POSTOPERATIVE DIAGNOSIS: Degenerative arthrosis of the right knee.   PROCEDURE PERFORMED: Right total knee arthroplasty using computer-assisted navigation.   SURGEON: Laurice Record. Holley Bouche., MD    ASSISTANT: Vance Peper, PA-C (required to maintain retraction throughout the procedure)   ANESTHESIA: Femoral nerve block and spinal.   ESTIMATED BLOOD LOSS: 50 mL.   FLUIDS REPLACED: 1200 mL of crystalloid.   TOURNIQUET TIME: 76 minutes.   DRAINS: Two medium drains to reinfusion system.   SOFT TISSUE RELEASES: Anterior cruciate ligament, posterior cruciate ligament, deep medial collateral ligament, and patellofemoral ligament.   IMPLANTS UTILIZED: DePuy PFC Sigma size 4n (narrow) posterior stabilized femoral component (cemented), size 3 MBT tibial component (cemented), 35 mm three-peg oval dome patella (cemented), and a 10 mm stabilized rotating platform polyethylene insert.  INDICATIONS FOR SURGERY: The patient is a 81 year old female who has been seen for complaints of progressive right knee pain. X-rays demonstrated significant degenerative changes in tricompartmental fashion. She did not see any significant improvement despite conservative nonsurgical intervention. After discussion of the risks and benefits of surgical intervention, the patient expressed her understanding of the risks and benefits and agreed with plans for surgical intervention.   PROCEDURE IN DETAIL: The patient was brought into the Operating Room, and after adequate femoral nerve block and spinal anesthesia was achieved, a tourniquet was placed on the patient's upper right thigh. The patient's right knee and leg were cleaned and prepped with alcohol and DuraPrep and draped in the usual sterile fashion. A "timeout" was performed as per usual protocol.  The right lower extremity was exsanguinated using an Esmarch, and the tourniquet was inflated to 300 mmHg. Anterior longitudinal incision was made followed by a standard mid vastus approach. A moderate effusion was evacuated. The deep fibers of the medial collateral ligament were elevated in a subperiosteal fashion off the medial flare of the tibia so as to maintain a continuous soft tissue sleeve. The patella was subluxed laterally and the patellofemoral ligament was incised. Inspection of the knee demonstrated significant degenerative changes, most notably to the medial compartment where full thickness loss of articular cartilage was noted. The prominent osteophytes were debrided using a rongeur. Anterior and posterior cruciate ligaments were excised. Two 4.0 mm Schanz pins were inserted into the femur and into the tibia for attachment of the array of trackers used for computer-assisted navigation. Hip center was identified using circumduction technique. Distal landmarks were mapped using the computer. The distal femur and proximal tibia were mapped using the computer. A distal femoral cutting guide was positioned using computer-assisted navigation so as to achieve a 5-degrees distal valgus cut. Cut was performed and verified using the computer. The distal femur was sized, and it was felt that a size 4n (narrow) was appropriate. A size 4 cutting guide was positioned and the anterior cut was performed and verified using the computer. This was followed by completion of the posterior and chamfer cuts. The femoral cutting guide for the central box was then positioned, and the central box cut was performed.   Attention was then directed to the proximal tibia. Medial and lateral menisci were excised. The extramedullary tibial cutting guide was positioned using computer-assisted navigation so as to achieve 0-degrees varus-valgus alignment and 0-degrees posterior slope. Cut was performed and verified using the computer.  The proximal tibia was sized, and  it was felt that a size 3 tray was appropriate. Tibial and femoral trials were inserted, followed by insertion of a 10 mm polyethylene insert. Excellent medial and lateral soft tissue balancing was appreciated both in extension and in flexion. Finally, the patella was cut and prepared so as to accommodate a 35 mm three-peg oval dome patella. A patellar trial was placed, and the knee was placed through a range of motion with excellent patellar tracking appreciated.   The femoral trial was removed. A central posthole for the tibial component was reamed followed by insertion of a keel punch. Tibial trials were then removed. The cut surfaces of bone were irrigated with copious amounts of normal saline with antibiotic solution using pulsatile lavage and then suctioned dry. Polymethylmethacrylate cement was prepared in the usual fashion using a vacuum mixer. Cement was applied to the cut surface of the proximal tibia as well as along the undersurface of a size 3 MBT tibial component. The tibial component was positioned and impacted into place. Excess cement was removed using freer elevators. Cement was then applied to the cut surface of the femur as well as along the posterior flanges of a size 4n (narrow) posterior stabilized femoral component. The femoral component was positioned and impacted into place. Excess cement was removed using freer elevators. A 10 mm polyethylene insert was placed, and the knee was brought in full extension with steady axial compression applied. Finally, cement was applied to the backside of a 35 mm three-peg oval dome patella, and the patellar component was positioned and patellar clamp applied. Excess cement was removed using freer elevators.   After adequate curing of the cement, the tourniquet was deflated after a total tourniquet time of 76 minutes. Hemostasis was achieved using electrocautery. The knee was irrigated with copious amounts of normal  saline with antibiotic solution using pulsatile lavage and then suctioned dry. The knee was inspected for any residual cement debris, and 30 mL of 0.25% Marcaine with epinephrine was injected along the posterior capsule. A 10 mm stabilized rotating platform polyethylene insert was inserted, and the knee was placed through a range of motion. Excellent medial and lateral soft tissue balancing was appreciated both in extension and in flexion and excellent patellar tracking appreciated. Two medium drains were placed in the wound bed and brought out through a separate stab incision to be attached to the reinfusion system. The medial parapatellar portion of the incision was reapproximated using interrupted sutures of #1 Vicryl. The subcutaneous tissue was approximated in layers using first #0 Vicryl, followed by 2-0 Vicryl. The skin was closed with skin staples. A sterile dressing was applied.   The patient tolerated the procedure well. She was transported to the recovery room in stable condition.    ____________________________ Laurice Record. Holley Bouche., MD [email or web address] D: 02/13/2012 20:13:00 ET T: 02/14/2012 12:05:34 ET JOB#: [id number]  cc: Jeneen Rinks P. Holley Bouche., MD, <Dictator> JAMES P Holley Bouche MD ELECTRONICALLY SIGNED 02/15/2012 20:16

## 2014-11-23 NOTE — Consult Note (Signed)
PATIENT NAME:  Susan Campos, Susan Campos MR#:  [id number] DATE OF BIRTH:  [birth year]  DATE OF CONSULTATION:  10/19/2011  REFERRING PHYSICIAN:   CONSULTING PHYSICIAN:  Jaylea Plourde A. Posey Pronto, MD  PRIMARY CARE PHYSICIAN: Salome Holmes, MD  NEUROLOGIST: Jennings Books, MD  CHIEF COMPLAINT: Numbness in the right first three fingers since this morning.   HISTORY OF PRESENT ILLNESS: Susan Campos is a pleasant 81 year old Caucasian female with past medical history of stroke in the past and history of hypertension who comes to the Emergency Room accompanied by her husband with symptoms of numbness in the right first three fingers which started this morning. The patient states she feels cold in those fingers. She  denies any weakness in those fingers.  She denies any other numbness or focal weakness in any of the extremities. The patient denies any dysarthria, dysphasia, or any visual problems. CT of the head was done in the Emergency Room. She received aspirin and took her blood pressure medicine this morning. Her blood pressure on arrival was 200/81. It is stable at present at 168/72. In the Emergency Room, the patient remains neurologically otherwise intact without any neuro deficits.   PAST MEDICAL HISTORY:  1. Cerebrovascular accident with MRI in the past, done in December 2012, showing left posterior high frontal lobe stroke. 2. Hypertension.   MEDICATIONS:  1. Triamterene/hydrochlorothiazide one p.o. daily.  2. Aspirin 325 mg p.o. daily.  3. Medication for arthritis.   ALLERGIES: Lovastatin.   SOCIAL HISTORY: She lives at home with her husband. Nonsmoker. Nonalcoholic.   FAMILY HISTORY: Positive for hypertension.  REVIEW OF SYSTEMS: CONSTITUTIONAL: No fever, fatigue, weakness, or pain. EYES: No blurred or double vision. ENT: No tinnitus, ear pain, or hearing loss. RESPIRATORY: No cough, wheeze, or hemoptysis. CARDIOVASCULAR: No chest pain, orthopnea, or edema. Positive for hypertension. GI: No nausea, vomiting,  diarrhea, or abdominal pain. GU: No dysuria or hematuria. ENDOCRINE: No polyuria or nocturia. HEMATOLOGY: No anemia or easy bruising. SKIN: No acne or rash. MUSCULOSKELETAL: Positive for arthritis. NEUROLOGIC: No cerebrovascular accident or transient ischemic. Positive for numbness in her right three fingers. PSYCH: No anxiety or depression. All other systems reviewed and negative.   PHYSICAL EXAMINATION:   GENERAL: The patient is awake, alert, and oriented x3, not in acute distress.   VITAL SIGNS: Afebrile, pulse 67, blood pressure 168/72, and saturation 98% on room air.   HEENT: Atraumatic, normocephalic. Pupils are equal, round, and reactive to light and accommodation. Extraocular movements intact. Oral mucosa is moist.   NECK: Supple. No JVD.   LUNGS: Clear to auscultation bilaterally. No rales, rhonchi, respiratory distress, or labored breathing.   HEART: Both heart sounds are normal. Rate and rhythm is regular. PMI is not lateralized. Chest is nontender.   EXTREMITIES: Good pedal pulses. Good femoral pulses. No lower extremity edema.   ABDOMEN: Soft, benign, and nontender. No organomegaly. Positive bowel sounds.   NEUROLOGIC: The patient is right-handed. No dysarthria. No facial droop. Motor exam in both upper and lower extremities appears to be normal. The patient has 4+/5 strength in all the extremities. Sensory exam is within normal limits, except on the right where the first three digits have very subtle minimal change in the sensation. Reflexes are 1+ in both upper and lower extremities. Plantars flex bilaterally. Gait normal.   SKIN: Warm and dry.   PSYCH: The patient is awake, alert, and oriented x3.   LABS/STUDIES: CT of the head showed involutional changes without acute abnormalities.   Chest x-ray: No acute cardiopulmonary  abnormality.   CBC and comprehensive metabolic panel within normal limits.   EKG shows sinus rhythm with PVCs.   ASSESSMENT: 81 year old Ms.  Campos with:  1. Numbness in the right first three fingers. Symptoms are more suggestive of carpal tunnel versus transient ischemic attack. Symptoms have improved. The patient does not have any other neuro deficits to suggest cerebrovascular accident.  2. History of left frontal lobe cerebrovascular accident in the past.  3. Hypertension, accelerated, improved.   PLAN: The plan was discussed with Dr. Trena Platt PA who discussed in turn with Dr. Manuella Ghazi and recommendations were to followup with Dr. Manuella Ghazi on Friday, 10/21/2011, at 11:30 a.m. Continue aspirin. Continue blood pressure medications. The patient and husband are advised to keep a log of blood pressures at home to get better control of blood pressure to avoid any symptoms of stroke in the future.   The patient and her husband are also advised if signs and symptoms worsen to return back to the Emergency Room. They did voice understanding. The patient will be discharged to home and followup with Manuella Ghazi on Friday, 10/21/2011, at 11:30 a.m., and then follow up with Dr. Salome Holmes in 1 to 2 weeks.  TIME SPENT: 50 minutes.  ____________________________ Hart Rochester Posey Pronto, MD sap:slb D: 10/19/2011 13:38:29 ET     T: 10/19/2011 14:11:51 ET       JOB#: [id number] cc: Salome Holmes, MD Hemang K. Manuella Ghazi, MD Ilda Basset MD ELECTRONICALLY SIGNED 11/07/2011 6:32

## 2014-11-26 NOTE — Discharge Summary (Signed)
PATIENT NAME:  Susan Campos, Susan Campos MR#:  [id number] DATE OF BIRTH:  1932-10-29  DATE OF ADMISSION:  07/28/2014 DATE OF DISCHARGE:  07/31/2014  ADMITTING DIAGNOSIS: Degenerative arthrosis of the left knee.   DISCHARGE DIAGNOSIS: Degenerative arthrosis of the left knee.  HISTORY: The patient is a pleasant 81 year old who has been followed at [facility] for progression of left knee pain. She had localized most of the pain along the medial aspect of the knee. Her pain was aggravated with standing as well as weight-bearing activities. She had reported some start-up stiffness as well as near giving way of the knee. At the time of surgery, she had gone to using a cane for ambulation due to the increased pain. Her left knee pain had progressed to the point that it was significantly interfering with her activities of daily living. X-rays taken in [place] showed narrowing of the medial cartilage space with associated varus alignment. She was noted to have osteophyte as well as subchondral sclerosis. After discussion of the risks and benefits of surgical intervention, the patient expressed her understanding of the risks and benefits and agreed for plans for surgical intervention.   PROCEDURE: Left total knee arthroplasty using computer-assisted navigation.   ANESTHESIA: Spinal.   SOFT TISSUE RELEASE: Anterior cruciate ligament, posterior cruciate ligament, deep medial collateral ligaments, as well as patellofemoral ligament.   IMPLANTS UTILIZED: DePuy PFC Sigma size 4 narrow posterior stabilized femoral component (cemented), size 3 MBT tibial component (cemented), 35 mm three-pegged oval dome patella (cemented), and a 10 mm stabilized rotating platform polyethylene insert.   HOSPITAL COURSE: The patient tolerated the procedure very well. She had no complications. She was then taken to the PAC-U where she was stabilized and then transferred to the orthopedic floor. The patient began  receiving anticoagulation therapy of Lovenox 30 mg subcutaneous q. 12 hours per anesthesia and pharmacy protocol. She was fitted with TED stockings bilaterally. These were allowed to be removed 1 hour per 8 hour shift. The left one was applied on day 2 following removal of the Hemovac and dressing change. The patient was also fitted with the AVI compression foot pumps bilaterally set at 80 mmHg. Her calves have been nontender. There has been no evidence of any DVTs. Negative Homans sign. Heels were elevated off the bed using rolled towels.   The patient has denied any chest pain or shortness of breath. Vital signs have been stable. She has been afebrile. Hemodynamically she was stable and no transfusions were given other than the Autovac transfusion given the first 6 hours postoperatively.   Physical therapy was initiated on day 1 for gait training and transfers. She has done very well. She has progressed as planned. There has been no complication. Occupational therapy was also initiated on day 1 for ADL and assistive devices.   The patient's IV, Foley and Hemovac were DC'd on day 2 along with a dressing change. The wound was free of any drainage or signs of infection. The Polar Care was reapplied to the surgical leg maintaining a temperature of 40 to 50 degrees Fahrenheit.   DISPOSITION: The patient is being discharged to skilled nursing facility in improved stable condition.   DISCHARGE INSTRUCTIONS: She may weight bear as tolerated. Continue using a rolling walker until cleared by physical therapy to go to a quad cane. She will continue receiving physical therapy for gait training and transfers. OT for ADL and assistive devices. Elevate the heels off the bed. Continue TED stockings bilaterally. These  are allowed to be removed 1 hour per 8 hour shift. Incentive spirometer q. 1 hour while awake. Encourage cough and deep breathing q. 2 hours while awake. She is placed on an ADA diet. Continue Polar Care  maintaining a temperature of 40 to 50 degrees Fahrenheit. Change dressing as needed. She has a follow-up appointment on January 12th at 9:15. Call sooner if any complications.   ALLERGIES: LOVASTATIN.   MEDICATIONS: Senokot-S 1 tablet b.i.d., Maxzide 50 one tablet q. a.m., multivitamin capsule 1 tablet daily, pantoprazole 40 mg b.i.d., Zocor 5 mg at bedtime, Lovenox 30 mg subcutaneous q. 12 hours x14 days and then discontinue and begin taking one 81 mg enteric-coated aspirin, Tylenol ES 500 to 1,000 mg q. 4 hours p.r.n., Mylanta DS 30 mL q. 6 hours p.r.n., Dulcolax suppository 10 mg rectally for constipation, calcium carbonate 500 mg b.i.d., milk of magnesia 30 mL b.i.d. p.r.n., Roxicodone 5 to 10 mg every 4 to 6 hours p.r.n., enema soapsuds if no results with milk of magnesia or Dulcolax.   PAST MEDICAL HISTORY: Melanoma, CVA, chickenpox, hyperlipidemia. ____________________________ Vance Peper, PA jrw:sb D: 07/31/2014 07:23:17 ET T: 07/31/2014 07:42:03 ET JOB#: [id number]  cc: Vance Peper, PA, <Dictator> Creedence Kunesh PA ELECTRONICALLY SIGNED 08/05/2014 13:49

## 2014-11-26 NOTE — Op Note (Signed)
PATIENT NAME:  Susan Campos, MULHALL MR#:  [id number] DATE OF BIRTH:  Dec 06, 1932  DATE OF PROCEDURE:  07/28/2014  PREOPERATIVE DIAGNOSIS: Degenerative arthrosis of the left knee (primary).   POSTOPERATIVE DIAGNOSIS: Degenerative arthrosis of the left knee (primary).   PROCEDURE PERFORMED: Left total knee arthroplasty using computer-assisted navigation.   SURGEON: Skip Estimable, MD.   ASSISTANT: Vance Peper, PA (required to maintain retraction throughout the procedure).    ANESTHESIA: Spinal.   ESTIMATED BLOOD LOSS: 50 mL.   FLUIDS REPLACED: 800 mL of crystalloid.   TOURNIQUET TIME: 68 minutes.   DRAINS: Two medium drains to reinfusion system.   SOFT TISSUE RELEASES: Anterior cruciate ligament, posterior cruciate ligament, deep medial collateral ligament and patellofemoral ligament.   IMPLANTS UTILIZED: DePuy PFC Sigma size 4N (narrow) posterior stabilized femoral component (cemented), size 3 MBT tibial component (cemented), 35 mm 3 peg oval dome patella (cemented), and a 10 mm stabilized rotating platform polyethylene insert.   INDICATIONS FOR SURGERY: The patient is a 81 year old female who has been seen for complaints of progressive left knee pain. X-rays demonstrated severe degenerative changes in tricompartmental fashion with varus deformity. After discussion of the risks and benefits of surgical intervention, the patient expressed understanding of the risks and benefits, and agreed with plans for surgical intervention.   PROCEDURE IN DETAIL: The patient was brought to the operating room and, after adequate spinal anesthesia was achieved, a tourniquet was placed on the patient's upper left thigh. The patient's left knee and leg were cleaned and prepped with alcohol and DuraPrep and draped in the usual sterile fashion. A "timeout" was performed as per usual protocol. The left lower extremity was exsanguinated using an Esmarch, and the tourniquet was inflated to 300 mmHg. An anterior  longitudinal incision was made followed by a standard mid vastus approach. A moderate effusion was evacuated. Deep fibers of the medial collateral ligament were elevated in a subperiosteal fashion off the medial flare of the tibia so as to maintain a continuous soft tissue sleeve. The patella was subluxed laterally and the patellofemoral ligament was incised. Inspection of the knee demonstrated severe degenerative changes with full-thickness loss of articular cartilage to the medial compartment. Prominent osteophytes were debrided using a rongeur. Anterior and posterior cruciate ligaments were excised. Two 4.0 mm Schanz pins were inserted into the femur and into the tibia for attachment of the array of trackers used for computer-assisted navigation. Hip center was identified using a circumduction technique. Distal landmarks were mapped using the computer. The distal femur and proximal tibia were mapped using the computer. Distal femoral cutting guide was positioned using computer-assisted navigation so as to achieve 5 degree distal valgus cut. Cut was performed and verified using the computer. Distal femur was sized and it was felt that a size 4N (narrow) femoral component was appropriate. A size 4 cutting guide was positioned and the anterior cut was performed and verified using the computer. This was followed by completion of the posterior and chamfer cuts. Femoral cutting guide for the central box was then positioned and the central box cut was performed. Attention was then directed to the proximal tibia. Medial and lateral menisci were excised. The extramedullary tibial cutting guide was positioned using computer-assisted navigation so as to achieve 0 degree varus-valgus alignment and 0 degree posterior slope. Cut was performed and verified using the computer. The proximal tibia was sized and it was felt that a size 3 tibial tray was appropriate. Tibial and femoral trials were inserted followed by insertion of  a  10 mm polyethylene trial. Good medial and lateral soft tissue balancing was appreciated both in extension and in flexion. Finally, the patella was cut and prepared so as to accommodate a 35 mm 3 peg oval dome patella. Patellar trial was placed and the knee was placed through a range of motion with excellent patellar tracking appreciated. The femoral trial was removed. Central post hole for the tibial component was reamed followed by insertion of a keel punch. Tibial trial was then removed. The cut surfaces of bone were irrigated with copious amounts of normal saline with antibiotic solution using pulsatile lavage, then suctioned dry. Polymethylmethacrylate cement was prepared in the usual fashion using a vacuum mixer. Cement was applied to the cut surface of the proximal tibia as well as on the undersurface of the size 3 MBT tibial component. The tibial component was positioned and impacted into place. Excess cement was removed using freer elevators. Cement was then applied to the cut surface of the femur as well as on the posterior flanges of a size 4N (narrow) posterior stabilized femoral component. Femoral component was positioned and impacted into place. Excess cement was removed using freer elevators. A 10 mm polyethylene trial was inserted and the knee was brought in full extension with steady axial compression applied. Finally, cement was applied on the backside of a 35 mm 3 peg oval dome patella and the patellar component was positioned and patellar clamp applied. Excess cement was removed using freer elevators.   After adequate curing of the cement, the tourniquet was deflated after a total tourniquet time of 68 minutes. Hemostasis was achieved using electrocautery. The knee was irrigated with copious amounts of normal saline with antibiotic solution using pulsatile lavage and then suctioned dry. The knee was inspected for any residual cement debris. Good hemostasis was noted. 20 mL of 1.3% Exparel in 40  mL of normal saline was then injected along the posterior capsule, medial and lateral gutters, and along the arthrotomy site. A 10 mm stabilized rotating platform polyethylene insert was inserted and the knee was placed through a range of motion with good patellar tracking appreciated and good medial and lateral soft tissue balancing both in extension and in flexion. Two medium drains were placed in the wound bed and brought out through a separate stab incision to be attached to reinfusion system. The medial parapatellar portion of the incision was reapproximated using interrupted sutures of number 1 Vicryl. The subcutaneous tissue was injected with 30 mL of 0.25% Marcaine with epinephrine. The subcutaneous tissue was then reapproximated in layers using first number 0 Vicryl followed by number 2-0 Vicryl. Skin was closed with skin staples. A sterile dressing was applied.   The patient tolerated the procedure well. She was transported to the recovery room in stable condition.    ____________________________ Laurice Record. Holley Bouche., MD jph:bu D: 07/28/2014 14:25:34 ET T: 07/28/2014 17:38:40 ET JOB#: [id number]  cc: Jeneen Rinks P. Holley Bouche., MD, <Dictator> JAMES P Holley Bouche MD ELECTRONICALLY SIGNED 08/18/2014 0:45

## 2015-01-14 ENCOUNTER — Other Ambulatory Visit: Payer: Self-pay | Admitting: Family Medicine

## 2015-01-14 DIAGNOSIS — I10 Essential (primary) hypertension: Secondary | ICD-10-CM | POA: Insufficient documentation

## 2015-01-18 DIAGNOSIS — Z96659 Presence of unspecified artificial knee joint: Secondary | ICD-10-CM | POA: Insufficient documentation

## 2015-05-18 DIAGNOSIS — H2512 Age-related nuclear cataract, left eye: Secondary | ICD-10-CM | POA: Diagnosis not present

## 2015-05-26 NOTE — Discharge Instructions (Signed)

## 2015-05-27 ENCOUNTER — Ambulatory Visit
Admission: RE | Admit: 2015-05-27 | Discharge: 2015-05-27 | Disposition: A | Discharge status: Home / Self Care | Payer: Medicare Other | Source: Ambulatory Visit | Attending: Ophthalmology | Admitting: Ophthalmology

## 2015-05-27 ENCOUNTER — Encounter
Admission: RE | Disposition: A | Discharge status: Home / Self Care | Payer: Self-pay | Source: Ambulatory Visit | Attending: Ophthalmology

## 2015-05-27 ENCOUNTER — Ambulatory Visit: Payer: Medicare Other | Admitting: Student in an Organized Health Care Education/Training Program

## 2015-05-27 ENCOUNTER — Encounter: Payer: Self-pay | Admitting: *Deleted

## 2015-05-27 DIAGNOSIS — E7800 Pure hypercholesterolemia, unspecified: Secondary | ICD-10-CM | POA: Diagnosis not present

## 2015-05-27 DIAGNOSIS — H2512 Age-related nuclear cataract, left eye: Secondary | ICD-10-CM | POA: Diagnosis not present

## 2015-05-27 DIAGNOSIS — Z79899 Other long term (current) drug therapy: Secondary | ICD-10-CM | POA: Insufficient documentation

## 2015-05-27 DIAGNOSIS — J45909 Unspecified asthma, uncomplicated: Secondary | ICD-10-CM | POA: Diagnosis not present

## 2015-05-27 DIAGNOSIS — Z96653 Presence of artificial knee joint, bilateral: Secondary | ICD-10-CM | POA: Diagnosis not present

## 2015-05-27 DIAGNOSIS — Z7982 Long term (current) use of aspirin: Secondary | ICD-10-CM | POA: Insufficient documentation

## 2015-05-27 DIAGNOSIS — H269 Unspecified cataract: Secondary | ICD-10-CM | POA: Diagnosis present

## 2015-05-27 DIAGNOSIS — R0601 Orthopnea: Secondary | ICD-10-CM | POA: Insufficient documentation

## 2015-05-27 DIAGNOSIS — K219 Gastro-esophageal reflux disease without esophagitis: Secondary | ICD-10-CM | POA: Diagnosis not present

## 2015-05-27 DIAGNOSIS — Z8673 Personal history of transient ischemic attack (TIA), and cerebral infarction without residual deficits: Secondary | ICD-10-CM | POA: Diagnosis not present

## 2015-05-27 DIAGNOSIS — I10 Essential (primary) hypertension: Secondary | ICD-10-CM | POA: Insufficient documentation

## 2015-05-27 DIAGNOSIS — Z85828 Personal history of other malignant neoplasm of skin: Secondary | ICD-10-CM | POA: Insufficient documentation

## 2015-05-27 DIAGNOSIS — Z888 Allergy status to other drugs, medicaments and biological substances status: Secondary | ICD-10-CM | POA: Diagnosis not present

## 2015-05-27 DIAGNOSIS — Z9841 Cataract extraction status, right eye: Secondary | ICD-10-CM | POA: Insufficient documentation

## 2015-05-27 HISTORY — DX: Cerebral infarction, unspecified: I63.9

## 2015-05-27 HISTORY — DX: Unspecified asthma, uncomplicated: J45.909

## 2015-05-27 HISTORY — DX: Unspecified osteoarthritis, unspecified site: M19.90

## 2015-05-27 HISTORY — DX: Essential (primary) hypertension: I10

## 2015-05-27 HISTORY — DX: Malignant (primary) neoplasm, unspecified: C80.1

## 2015-05-27 HISTORY — PX: CATARACT EXTRACTION W/PHACO: SHX586

## 2015-05-27 HISTORY — DX: Orthopnea: R06.01

## 2015-05-27 HISTORY — DX: Dizziness and giddiness: R42

## 2015-05-27 SURGERY — PHACOEMULSIFICATION, CATARACT, WITH IOL INSERTION
Anesthesia: Monitor Anesthesia Care | Site: Eye | Laterality: Left | Wound class: Clean

## 2015-05-27 MED ORDER — BRIMONIDINE TARTRATE 0.2 % OP SOLN
OPHTHALMIC | Status: DC | PRN
  Administered 2015-05-27: 1 [drp] via OPHTHALMIC

## 2015-05-27 MED ORDER — ACETAMINOPHEN 160 MG/5ML PO SOLN
325.0000 mg | ORAL | Status: DC | PRN
Start: 2015-05-27 — End: 2015-05-27

## 2015-05-27 MED ORDER — CEFUROXIME OPHTHALMIC INJECTION 1 MG/0.1 ML
INJECTION | OPHTHALMIC | Status: DC | PRN
  Administered 2015-05-27: 0.1 mL via INTRACAMERAL

## 2015-05-27 MED ORDER — EPINEPHRINE HCL 1 MG/ML IJ SOLN
INTRAOCULAR | Status: DC | PRN
  Administered 2015-05-27: 68 mL via OPHTHALMIC

## 2015-05-27 MED ORDER — MIDAZOLAM HCL 2 MG/2ML IJ SOLN
INTRAMUSCULAR | Status: DC | PRN
Start: 2015-05-27 — End: 2015-05-27
  Administered 2015-05-27: 1.5 mg via INTRAVENOUS

## 2015-05-27 MED ORDER — ACETAMINOPHEN 160 MG/5ML PO SOLN: 325.0000 mg | ORAL | Status: DC | PRN

## 2015-05-27 MED ORDER — ARMC OPHTHALMIC DILATING GEL
1.0000 "application " | OPHTHALMIC | Status: DC | PRN
  Administered 2015-05-27 (×2): 1 via OPHTHALMIC

## 2015-05-27 MED ORDER — ACETAMINOPHEN 325 MG PO TABS: 325.0000 mg | ORAL_TABLET | ORAL | Status: DC | PRN

## 2015-05-27 MED ORDER — POVIDONE-IODINE 5 % OP SOLN
1.0000 "application " | OPHTHALMIC | Status: DC | PRN
  Administered 2015-05-27: 1 via OPHTHALMIC

## 2015-05-27 MED ORDER — LACTATED RINGERS IV SOLN: INTRAVENOUS | Status: DC

## 2015-05-27 MED ORDER — FENTANYL CITRATE (PF) 100 MCG/2ML IJ SOLN
INTRAMUSCULAR | Status: DC | PRN
  Administered 2015-05-27: 50 ug via INTRAVENOUS

## 2015-05-27 MED ORDER — TIMOLOL MALEATE 0.5 % OP SOLN
OPHTHALMIC | Status: DC | PRN
  Administered 2015-05-27: 1 [drp] via OPHTHALMIC

## 2015-05-27 MED ORDER — NA HYALUR & NA CHOND-NA HYALUR 0.4-0.35 ML IO KIT
PACK | INTRAOCULAR | Status: DC | PRN
  Administered 2015-05-27: 1 mL via INTRAOCULAR

## 2015-05-27 MED ORDER — TETRACAINE HCL 0.5 % OP SOLN
1.0000 [drp] | OPHTHALMIC | Status: DC | PRN
  Administered 2015-05-27: 1 [drp] via OPHTHALMIC

## 2015-05-27 SURGICAL SUPPLY — 26 items
CANNULA ANT/CHMB 27GA (MISCELLANEOUS) ×3 IMPLANT
GLOVE SURG LX 7.5 STRW (GLOVE) ×2
GLOVE SURG LX STRL 7.5 STRW (GLOVE) ×1 IMPLANT
GLOVE SURG TRIUMPH 8.0 PF LTX (GLOVE) ×3 IMPLANT
GOWN STRL REUS W/ TWL LRG LVL3 (GOWN DISPOSABLE) ×2 IMPLANT
GOWN STRL REUS W/TWL LRG LVL3 (GOWN DISPOSABLE) ×4
LENS IOL TECNIS 18.0 (Intraocular Lens) ×3 IMPLANT
LENS IOL TECNIS MONO 1P 18.0 (Intraocular Lens) ×1 IMPLANT
MARKER SKIN SURG W/RULER VIO (MISCELLANEOUS) ×3 IMPLANT
NDL RETROBULBAR .5 NSTRL (NEEDLE) IMPLANT
NEEDLE FILTER BLUNT 18X 1/2SAF (NEEDLE) ×4
NEEDLE FILTER BLUNT 18X1 1/2 (NEEDLE) ×2 IMPLANT
PACK CATARACT BRASINGTON (MISCELLANEOUS) ×3 IMPLANT
PACK EYE AFTER SURG (MISCELLANEOUS) ×3 IMPLANT
PACK OPTHALMIC (MISCELLANEOUS) ×3 IMPLANT
RING MALYGIN 7.0 (MISCELLANEOUS) IMPLANT
SUT ETHILON 10-0 CS-B-6CS-B-6 (SUTURE)
SUT VICRYL  9 0 (SUTURE)
SUT VICRYL 9 0 (SUTURE) IMPLANT
SUTURE EHLN 10-0 CS-B-6CS-B-6 (SUTURE) IMPLANT
SYR 3ML LL SCALE MARK (SYRINGE) ×6 IMPLANT
SYR 5ML LL (SYRINGE) IMPLANT
SYR TB 1ML LUER SLIP (SYRINGE) ×3 IMPLANT
WATER STERILE IRR 250ML POUR (IV SOLUTION) ×3 IMPLANT
WATER STERILE IRR 500ML POUR (IV SOLUTION) IMPLANT
WIPE NON LINTING 3.25X3.25 (MISCELLANEOUS) ×3 IMPLANT

## 2015-05-27 NOTE — Op Note (Signed)
OPERATIVE NOTE  Susan Campos [id number] 05/27/2015   PREOPERATIVE DIAGNOSIS:  Nuclear sclerotic cataract left eye. H25.12   POSTOPERATIVE DIAGNOSIS:    Nuclear sclerotic cataract left eye.     PROCEDURE:  Phacoemusification with posterior chamber intraocular lens placement of the left eye   LENS:   Implant Name Type Inv. Item Serial No. Manufacturer Lot No. LRB No. Used  LENS IMPL INTRAOC ZCB00 18.0 - [id number] Intraocular Lens LENS IMPL INTRAOC ZCB00 18.0 [id number] AMO   Left 1        ULTRASOUND TIME: 12.6  % of 1 minutes 3 seconds, CDE 8.1  SURGEON:  Wyonia Hough, MD   ANESTHESIA:  Topical with tetracaine drops and 2% Xylocaine jelly.   COMPLICATIONS:  None.   DESCRIPTION OF PROCEDURE:  The patient was identified in the holding room and transported to the operating room and placed in the supine position under the operating microscope.  The left eye was identified as the operative eye and it was prepped and draped in the usual sterile ophthalmic fashion.   A 1 millimeter clear-corneal paracentesis was made at the 1:30 position.  The anterior chamber was filled with Viscoat viscoelastic.  A 2.4 millimeter keratome was used to make a near-clear corneal incision at the 10:30 position.  .  A curvilinear capsulorrhexis was made with a cystotome and capsulorrhexis forceps.  Balanced salt solution was used to hydrodissect and hydrodelineate the nucleus.   Phacoemulsification was then used in stop and chop fashion to remove the lens nucleus and epinucleus.  The remaining cortex was then removed using the irrigation and aspiration handpiece. Provisc was then placed into the capsular bag to distend it for lens placement.  A lens was then injected into the capsular bag.  The remaining viscoelastic was aspirated.   Wounds were hydrated with balanced salt solution.  The anterior chamber was inflated to a physiologic pressure with balanced salt solution.  No wound leaks were  noted. Cefuroxime 0.1 ml of a 10mg /ml solution was injected into the anterior chamber for a dose of 1 mg of intracameral antibiotic at the completion of the case.   Timolol and Brimonidine drops were applied to the eye.  The patient was taken to the recovery room in stable condition without complications of anesthesia or surgery.  [name] 05/27/2015, 10:11 AM

## 2015-05-27 NOTE — Anesthesia Postprocedure Evaluation (Signed)
  Anesthesia Post-op Note  Patient: Susan Campos  Procedure(s) Performed: Procedure(s): CATARACT EXTRACTION PHACO AND INTRAOCULAR LENS PLACEMENT (IOC) (Left)  Anesthesia type:MAC  Patient location: PACU  Post pain: Pain level controlled  Post assessment: Post-op Vital signs reviewed, Patient's Cardiovascular Status Stable, Respiratory Function Stable, Patent Airway and No signs of Nausea or vomiting  Post vital signs: Reviewed and stable  Last Vitals:  Filed Vitals:   05/27/15 1015  BP: 162/82  Pulse: 76  Temp:   Resp: 20    Level of consciousness: awake, alert  and patient cooperative  Complications: No apparent anesthesia complications

## 2015-05-27 NOTE — Anesthesia Preprocedure Evaluation (Signed)
Anesthesia Evaluation  Patient identified by MRN, date of birth, ID band  Reviewed: Allergy & Precautions, H&P , NPO status , Patient's Chart, lab work & pertinent test results  Airway Mallampati: II  TM Distance: >3 FB Neck ROM: full    Dental no notable dental hx.    Pulmonary asthma , former smoker,    Pulmonary exam normal        Cardiovascular hypertension,  Rhythm:regular Rate:Normal     Neuro/Psych    GI/Hepatic GERD  ,  Endo/Other    Renal/GU      Musculoskeletal   Abdominal   Peds  Hematology   Anesthesia Other Findings   Reproductive/Obstetrics                             Anesthesia Physical Anesthesia Plan  ASA: II  Anesthesia Plan: MAC   Post-op Pain Management:    Induction:   Airway Management Planned:   Additional Equipment:   Intra-op Plan:   Post-operative Plan:   Informed Consent: I have reviewed the patients History and Physical, chart, labs and discussed the procedure including the risks, benefits and alternatives for the proposed anesthesia with the patient or authorized representative who has indicated his/her understanding and acceptance.     Plan Discussed with: CRNA  Anesthesia Plan Comments:         Anesthesia Quick Evaluation

## 2015-05-27 NOTE — Transfer of Care (Signed)
Immediate Anesthesia Transfer of Care Note  Patient: Susan Campos  Procedure(s) Performed: Procedure(s): CATARACT EXTRACTION PHACO AND INTRAOCULAR LENS PLACEMENT (IOC) (Left)  Patient Location: PACU  Anesthesia Type: MAC  Level of Consciousness: awake, alert  and patient cooperative  Airway and Oxygen Therapy: Patient Spontanous Breathing and Patient connected to supplemental oxygen  Post-op Assessment: Post-op Vital signs reviewed, Patient's Cardiovascular Status Stable, Respiratory Function Stable, Patent Airway and No signs of Nausea or vomiting  Post-op Vital Signs: Reviewed and stable  Complications: No apparent anesthesia complications

## 2015-05-27 NOTE — Anesthesia Procedure Notes (Signed)
Procedure Name: MAC Performed by: Deijah Spikes Pre-anesthesia Checklist: Patient identified, Emergency Drugs available, Suction available, Timeout performed and Patient being monitored Patient Re-evaluated:Patient Re-evaluated prior to inductionOxygen Delivery Method: Nasal cannula Placement Confirmation: positive ETCO2       

## 2015-05-27 NOTE — H&P (Signed)
  The History and Physical notes are on paper, have been signed, and are to be scanned. The patient remains stable and unchanged from the H&P.   Previous H&P reviewed, patient examined, and there are no changes.  Susan Campos 05/27/2015 9:38 AM

## 2015-05-28 ENCOUNTER — Encounter: Payer: Self-pay | Admitting: Ophthalmology

## 2015-07-21 DIAGNOSIS — Z96652 Presence of left artificial knee joint: Secondary | ICD-10-CM | POA: Diagnosis not present

## 2015-08-15 ENCOUNTER — Other Ambulatory Visit: Payer: Self-pay | Admitting: Family Medicine

## 2015-08-15 DIAGNOSIS — E7800 Pure hypercholesterolemia, unspecified: Secondary | ICD-10-CM

## 2015-08-17 DIAGNOSIS — E7800 Pure hypercholesterolemia, unspecified: Secondary | ICD-10-CM | POA: Insufficient documentation

## 2015-08-17 NOTE — Telephone Encounter (Signed)
Last OV 08/2014  Thanks,   -Shalon Councilman  

## 2015-09-24 DIAGNOSIS — E669 Obesity, unspecified: Secondary | ICD-10-CM | POA: Insufficient documentation

## 2015-09-24 DIAGNOSIS — N183 Chronic kidney disease, stage 3 unspecified: Secondary | ICD-10-CM | POA: Insufficient documentation

## 2015-09-24 DIAGNOSIS — Z85820 Personal history of malignant melanoma of skin: Secondary | ICD-10-CM | POA: Insufficient documentation

## 2015-09-24 DIAGNOSIS — E876 Hypokalemia: Secondary | ICD-10-CM | POA: Insufficient documentation

## 2015-09-24 DIAGNOSIS — K529 Noninfective gastroenteritis and colitis, unspecified: Secondary | ICD-10-CM | POA: Insufficient documentation

## 2015-09-24 DIAGNOSIS — I10 Essential (primary) hypertension: Secondary | ICD-10-CM | POA: Insufficient documentation

## 2015-09-24 DIAGNOSIS — Z8673 Personal history of transient ischemic attack (TIA), and cerebral infarction without residual deficits: Secondary | ICD-10-CM | POA: Insufficient documentation

## 2015-09-24 DIAGNOSIS — I679 Cerebrovascular disease, unspecified: Secondary | ICD-10-CM | POA: Insufficient documentation

## 2015-09-24 DIAGNOSIS — J45909 Unspecified asthma, uncomplicated: Secondary | ICD-10-CM | POA: Insufficient documentation

## 2015-09-24 DIAGNOSIS — K2100 Gastro-esophageal reflux disease with esophagitis, without bleeding: Secondary | ICD-10-CM | POA: Insufficient documentation

## 2015-09-24 DIAGNOSIS — M1990 Unspecified osteoarthritis, unspecified site: Secondary | ICD-10-CM | POA: Insufficient documentation

## 2015-10-20 ENCOUNTER — Encounter: Payer: Self-pay | Admitting: Family Medicine

## 2015-10-20 ENCOUNTER — Ambulatory Visit (INDEPENDENT_AMBULATORY_CARE_PROVIDER_SITE_OTHER): Payer: Medicare Other | Admitting: Family Medicine

## 2015-10-20 VITALS — BP 154/72 | HR 76 | Temp 97.6°F | Resp 16 | Wt 190.0 lb

## 2015-10-20 DIAGNOSIS — R2681 Unsteadiness on feet: Secondary | ICD-10-CM | POA: Diagnosis not present

## 2015-10-20 DIAGNOSIS — I10 Essential (primary) hypertension: Secondary | ICD-10-CM | POA: Diagnosis not present

## 2015-10-20 DIAGNOSIS — W19XXXA Unspecified fall, initial encounter: Secondary | ICD-10-CM | POA: Diagnosis not present

## 2015-10-20 DIAGNOSIS — E7800 Pure hypercholesterolemia, unspecified: Secondary | ICD-10-CM | POA: Diagnosis not present

## 2015-10-20 DIAGNOSIS — R202 Paresthesia of skin: Secondary | ICD-10-CM

## 2015-10-20 DIAGNOSIS — Z8673 Personal history of transient ischemic attack (TIA), and cerebral infarction without residual deficits: Secondary | ICD-10-CM

## 2015-10-20 NOTE — Progress Notes (Signed)
Subjective:    Patient ID: Susan Campos, female    DOB: 03-20-1933, 82 y.o.   MRN: [id number]  Hypertension This is a chronic problem. Associated symptoms include headaches ("a few"). Treatments tried: currently taking Triamterene-HCTZ 75-50 mg.  Hyperlipidemia This is a chronic problem. Lipid results: 07/11/2014- Total- 179, Trig- 270, HDL- 34, LDL- 91. Current antihyperlipidemic treatment includes statins (Simvastatin 5 mg).  Fall The accident occurred more than 1 week ago (3 months). Fall occurred: while doing laundry. Impact surface: pile of clothes. There was no blood loss. The point of impact was the left wrist. The pain is present in the left wrist. The pain is at a severity of 0/10. Pain severity now: "aggravation" Associated symptoms include headaches ("a few").  Pt is c/o weakness. Is concerned this may be secondary to "body in shock" after falling vs a stroke.    Review of Systems  Constitutional: Positive for activity change and appetite change.  Neurological: Positive for dizziness (with standing), weakness and headaches ("a few").  Psychiatric/Behavioral: Positive for decreased concentration. The patient is nervous/anxious.    BP 154/72 mmHg  Pulse 76  Temp(Src) 97.6 F (36.4 C) (Oral)  Resp 16  Wt 190 lb (86.183 kg)   Patient Active Problem List   Diagnosis Date Noted  . Arthritis 09/24/2015  . Airway hyperreactivity 09/24/2015  . Benign hypertension 09/24/2015  . Chronic kidney disease (CKD), stage III (moderate) 09/24/2015  . Colitis 09/24/2015  . Esophagitis, reflux 09/24/2015  . H/O stroke without residual deficits 09/24/2015  . Decreased potassium in the blood 09/24/2015  . Cutaneous malignant melanoma ([place]) 09/24/2015  . Adiposity 09/24/2015  . Temporary cerebral vascular dysfunction 09/24/2015  . Hypercholesteremia 08/17/2015  . Hypertension 01/14/2015   Past Medical History  Diagnosis Date  . GERD (gastroesophageal reflux disease)   .  Hypercholesterolemia   . Cancer ([place])     skin  . Swelling of both lower extremities     feet and legs  . Hypertension   . Asthma     HX OF  . Stroke [facility])     CVA 2012/ MEMORY LOSS, 1999  . Arthritis     HIPS  . Vertigo     OCCASIONAL  . Orthopnea     sleeps in recliner, cannot lay flat on her back   Current Outpatient Prescriptions on File Prior to Visit  Medication Sig  . acetaminophen (TYLENOL ARTHRITIS PAIN) 650 MG CR tablet Take by mouth.  Marland Kitchen aspirin EC 81 MG tablet Take 81 mg by mouth daily. 9 am  . Multiple Vitamin (MULTIVITAMIN) capsule Take 1 capsule by mouth daily. 9 AM  . Sennosides-Docusate Sodium (SENNA) 8.6-50 MG TABS Take by mouth.  . simvastatin (ZOCOR) 5 MG tablet Take 1 tablet (5 mg total) by mouth every evening. Need to schedule an office visit before anymore refills.  . triamterene-hydrochlorothiazide (MAXZIDE) 75-50 MG per tablet TAKE 1 TABLET BY MOUTH EVERY DAY   No current facility-administered medications on file prior to visit.   Allergies  Allergen Reactions  . Lovastatin Shortness Of Breath and Palpitations   Past Surgical History  Procedure Laterality Date  . Knee arthroscopy    . Replacement total knee bilateral    . Uterine suspension    . Cataract extraction Right   . Cataract extraction w/phaco Left 05/27/2015    Procedure: CATARACT EXTRACTION PHACO AND INTRAOCULAR LENS PLACEMENT (IOC);  Surgeon: Leandrew Koyanagi, MD;  Location: [facility];  Service: Ophthalmology;  Laterality: Left;  .  Mouth surgery    . Melanoma excision      from chest   Social History   Social History  . Marital Status: Married    Spouse Name: N/A  . Number of Children: N/A  . Years of Education: N/A   Occupational History  . Not on file.   Social History Main Topics  . Smoking status: Former Smoker -- 0.50 packs/day for 4 years    Types: Cigarettes  . Smokeless tobacco: Never Used  . Alcohol Use: No  . Drug Use: No  . Sexual Activity: Not on  file   Other Topics Concern  . Not on file   Social History Narrative   Family History  Problem Relation Age of Onset  . Heart attack Mother   . Heart attack Father   . Heart attack Paternal Grandmother   . Skin cancer Paternal Grandmother       Objective:   Physical Exam  Constitutional: She appears well-developed and well-nourished.  Cardiovascular: Normal rate, regular rhythm and normal heart sounds.   Pulmonary/Chest: Effort normal and breath sounds normal. No respiratory distress.  Musculoskeletal:  Ambulating with cane. Strength normal.  Psychiatric: She has a normal mood and affect.  BP 154/72 mmHg  Pulse 76  Temp(Src) 97.6 F (36.4 C) (Oral)  Resp 16  Wt 190 lb (86.183 kg)     Assessment & Plan:  1. Benign hypertension Stable. FU pending results. - CBC with Differential/Platelet - Comprehensive metabolic panel  2. Hypercholesteremia FU pending results. - Lipid panel - TSH  3. H/O stroke without residual deficits Pt is concerned she may has had another stroke after falling. Pt has seen Dr. Manuella Ghazi for this previously. Advised pt her current sx are not concerning of stroke.  4. Fall, initial encounter Pt is experiencing anxiety after falling.   Depression screen PHQ 2/9 10/20/2015  Decreased Interest 1  Down, Depressed, Hopeless 0  PHQ - 2 Score 1  Altered sleeping 0  Tired, decreased energy 3  Change in appetite 0  Feeling bad or failure about yourself  1  Trouble concentrating 0  Moving slowly or fidgety/restless 0  Suicidal thoughts 0  PHQ-9 Score 5  Difficult doing work/chores Not difficult at all   5. Paresthesia FU pending results. - TSH - Vitamin B12  6. Gait instability Worsening. Will refer to PT as below. - Ambulatory referral to Physical Therapy  Patient seen and examined by Jerrell Belfast, MD, and note scribed by Renaldo Fiddler, CMA.   I have reviewed the document for accuracy and completeness and I agree with above. Jerrell Belfast, MD  Margarita Rana, MD

## 2015-10-21 LAB — COMPREHENSIVE METABOLIC PANEL
A/G RATIO: 1.6 (ref 1.2–2.2)
ALK PHOS: 100 IU/L (ref 39–117)
ALT: 20 IU/L (ref 0–32)
AST: 18 IU/L (ref 0–40)
Albumin: 4.5 g/dL (ref 3.5–4.7)
BILIRUBIN TOTAL: 0.6 mg/dL (ref 0.0–1.2)
BUN/Creatinine Ratio: 22 (ref 11–26)
BUN: 29 mg/dL — ABNORMAL HIGH (ref 8–27)
CHLORIDE: 97 mmol/L (ref 96–106)
CO2: 27 mmol/L (ref 18–29)
Calcium: 9.8 mg/dL (ref 8.7–10.3)
Creatinine, Ser: 1.32 mg/dL — ABNORMAL HIGH (ref 0.57–1.00)
GFR calc non Af Amer: 38 mL/min/{1.73_m2} — ABNORMAL LOW (ref >59)
GFR, EST AFRICAN AMERICAN: 44 mL/min/{1.73_m2} — AB (ref >59)
GLUCOSE: 87 mg/dL (ref 65–99)
Globulin, Total: 2.9 g/dL (ref 1.5–4.5)
POTASSIUM: 3.9 mmol/L (ref 3.5–5.2)
Sodium: 143 mmol/L (ref 134–144)
Total Protein: 7.4 g/dL (ref 6.0–8.5)

## 2015-10-21 LAB — CBC WITH DIFFERENTIAL/PLATELET
BASOS ABS: 0 10*3/uL (ref 0.0–0.2)
Basos: 0 %
EOS (ABSOLUTE): 0.1 10*3/uL (ref 0.0–0.4)
EOS: 2 %
Hematocrit: 42.8 % (ref 34.0–46.6)
Hemoglobin: 14.6 g/dL (ref 11.1–15.9)
Immature Grans (Abs): 0 10*3/uL (ref 0.0–0.1)
Immature Granulocytes: 0 %
LYMPHS ABS: 1.9 10*3/uL (ref 0.7–3.1)
Lymphs: 28 %
MCH: 30 pg (ref 26.6–33.0)
MCHC: 34.1 g/dL (ref 31.5–35.7)
MCV: 88 fL (ref 79–97)
MONOCYTES: 9 %
MONOS ABS: 0.6 10*3/uL (ref 0.1–0.9)
NEUTROS ABS: 4.3 10*3/uL (ref 1.4–7.0)
Neutrophils: 61 %
PLATELETS: 220 10*3/uL (ref 150–379)
RBC: 4.86 x10E6/uL (ref 3.77–5.28)
RDW: 13.5 % (ref 12.3–15.4)
WBC: 7 10*3/uL (ref 3.4–10.8)

## 2015-10-21 LAB — LIPID PANEL
CHOLESTEROL TOTAL: 272 mg/dL — AB (ref 100–199)
Chol/HDL Ratio: 7.4 ratio units — ABNORMAL HIGH (ref 0.0–4.4)
HDL: 37 mg/dL — AB (ref >39)
LDL Calculated: 182 mg/dL — ABNORMAL HIGH (ref 0–99)
Triglycerides: 267 mg/dL — ABNORMAL HIGH (ref 0–149)
VLDL CHOLESTEROL CAL: 53 mg/dL — AB (ref 5–40)

## 2015-10-21 LAB — TSH: TSH: 3.52 u[IU]/mL (ref 0.450–4.500)

## 2015-10-21 LAB — VITAMIN B12: Vitamin B-12: 667 pg/mL (ref 211–946)

## 2015-10-22 ENCOUNTER — Telehealth: Payer: Self-pay

## 2015-10-22 NOTE — Telephone Encounter (Signed)
-----   Message from Margarita Rana, MD sent at 10/21/2015  2:34 PM EDT ----- Labs stable except for very high cholesterol. Please see if taking her cholesterol medication and in light of her concern about a stroke, please see if she would to change her medication to something stronger.  Know she has had trouble with this in the past. Thanks.

## 2015-10-22 NOTE — Telephone Encounter (Signed)
Pt states she did stop taking medication while she was sick for about 2 weeks. Did start medication back about 1 week ago. Please advise. [place], CMA

## 2015-10-22 NOTE — Telephone Encounter (Signed)
Left message to call back  

## 2015-10-22 NOTE — Telephone Encounter (Signed)
Still too high. Does she want to change medication or stay on the same medication.  Thanks.

## 2015-10-26 NOTE — Telephone Encounter (Signed)
Patient reports she would like to continue her same medication. Patient reports that she was not taking her medication every night for the past couple of months. Patient is requesting to have her labs rechecked in a few weeks. Please advised when her labs need to be rechecked.

## 2015-10-26 NOTE — Telephone Encounter (Signed)
Recheck in 4 weeks.  Thanks.

## 2015-10-26 NOTE — Telephone Encounter (Signed)
Patient advised as below.  

## 2015-11-03 ENCOUNTER — Ambulatory Visit: Payer: Medicare Other | Attending: Family Medicine

## 2015-11-03 DIAGNOSIS — R2681 Unsteadiness on feet: Secondary | ICD-10-CM | POA: Diagnosis not present

## 2015-11-03 NOTE — Therapy (Signed)
[place] MAIN [facility] SERVICES [address] [place], [place], [address] Phone: [phone]   Fax:  [phone]  Physical Therapy Evaluation  Patient Details  Name: Susan Campos MRN: [id number] Date of Birth: 1933/06/03 Referring Provider: Venia Minks  Encounter Date: 11/03/2015      PT End of Session - 11/03/15 0948    Visit Number 1   Number of Visits 9   Date for PT Re-Evaluation 12/01/15   Authorization Type 1/10   PT Start Time 0855   PT Stop Time 0935   PT Time Calculation (min) 40 min   Equipment Utilized During Treatment Gait belt   Activity Tolerance Patient tolerated treatment well   Behavior During Therapy [facility] for tasks assessed/performed  needs redirection to stay on task      Past Medical History  Diagnosis Date  . GERD (gastroesophageal reflux disease)   . Hypercholesterolemia   . Cancer ([place])     skin  . Swelling of both lower extremities     feet and legs  . Hypertension   . Asthma     HX OF  . Arthritis     HIPS  . Vertigo     OCCASIONAL  . Orthopnea     sleeps in recliner, cannot lay flat on her back  . Stroke [facility])     CVA 2012/ MEMORY LOSS, 1999, 1 more in 2011    Past Surgical History  Procedure Laterality Date  . Knee arthroscopy    . Replacement total knee bilateral    . Uterine suspension    . Cataract extraction Right   . Cataract extraction w/phaco Left 05/27/2015    Procedure: CATARACT EXTRACTION PHACO AND INTRAOCULAR LENS PLACEMENT (IOC);  Surgeon: Leandrew Koyanagi, MD;  Location: [place];  Service: Ophthalmology;  Laterality: Left;  Marland Kitchen Mouth surgery    . Melanoma excision      from chest    There were no vitals filed for this visit.  Visit Diagnosis:  Unsteadiness on feet - Plan: PT plan of care cert/re-cert      Subjective Assessment - 11/03/15 0905    Subjective pt reports having a fall in Dec 2016 while doing laundry. she has had 2 CVAs in the past which did  not affect her physcially, but she has memory loss. she reports since her fall she is more nervous and has slowed down. she does admit having balance difficulties. pt reports she is using a cane now after the fall whereas she was before. pt denies any numbness tingling.             [facility] PT Assessment - 11/03/15 0001    Assessment   Medical Diagnosis gait instability   Referring Provider maloney   Onset Date/Surgical Date 07/05/15   Prior Therapy PT for knees   Precautions   Precautions Fall   Restrictions   Weight Bearing Restrictions No   Balance Screen   Has the patient fallen in the past 6 months No   Has the patient had a decrease in activity level because of a fear of falling?  Yes   Is the patient reluctant to leave their home because of a fear of falling?  No   Home Social worker Private residence   Living Arrangements Spouse/significant other   Available Help at Discharge Family   Type of [place] to enter   Entrance Stairs-Number of Steps 4  Entrance Stairs-Rails Right   Home Layout Two level   [place] - 2 wheels;Cane - single point;Toilet riser   Prior Function   Level of Independence Independent;Independent with basic ADLs;Independent with community mobility without device   Architect  quilting class for "memory" 1x/week   Leisure --  feed small farm animals, quilting    Cognition   Overall Cognitive Status History of cognitive impairments - at baseline   Attention --  gets off topic   Memory Impaired   Memory Impairment Decreased short term memory   Sensation   Light Touch Appears Intact   Standardized Balance Assessment   Standardized Balance Assessment Berg Balance Test;Five Times Sit to Stand;10 meter walk test   Five times sit to stand comments  28s   10 Meter Walk 0.70m/s   Berg Balance Test   Sit to Stand Able to stand  independently using hands   Standing Unsupported Able to stand 2  minutes with supervision   Sitting with Back Unsupported but Feet Supported on Floor or Stool Able to sit safely and securely 2 minutes   Stand to Sit Controls descent by using hands   Transfers Able to transfer safely, definite need of hands   Standing Unsupported with Eyes Closed Able to stand 10 seconds with supervision   Standing Ubsupported with Feet Together Needs help to attain position but able to stand for 30 seconds with feet together   From Standing, Reach Forward with Outstretched Arm Can reach forward >5 cm safely (2")   From Standing Position, Pick up Object from Floor Able to pick up shoe, needs supervision   From Standing Position, Turn to Look Behind Over each Shoulder Turn sideways only but maintains balance   Turn 360 Degrees Able to turn 360 degrees safely but slowly   Standing Unsupported, Alternately Place Feet on Step/Stool Able to complete >2 steps/needs minimal assist   Standing Unsupported, One Foot in Front Able to plae foot ahead of the other independently and hold 30 seconds   Standing on One Leg Tries to lift leg/unable to hold 3 seconds but remains standing independently   Total Score 34        POSTURE/OBSERVATION: Pt in no acute distress   PROM/AROM: WNL of the extremities   STRENGTH:  Graded on a 0-5 scale Muscle Group Left Right  Shoulder flex    Shoulder Abd    Shoulder Ext    Shoulder IR/ER    Elbow    Wrist/hand     Hip Flex 4 3+  Hip Abd 2+ 2+  Hip Add 2 2  Hip Ext 2 2  Hip IR/ER    Knee Flex 4 4  Knee Ext 4 3+  Ankle DF 4 4-  Ankle PF 4 4   SENSATION:  WNL bilaterally  (-) clonus   FUNCTIONAL MOBILITY: Pt requires increased time for transfers. Pt is unable to lay supine  BALANCE: Impaired static and dynamic   GAIT: Pt ambulates with wide BOS, reduced heel/toe transfer, reduced stride length                        PT Education - 11/03/15 0948    Education provided Yes   Education Details continue with  current HEP, but do both legs    Person(s) Educated Patient   Methods Explanation   Comprehension Verbalized understanding             PT Long Term  Goals - 11-23-15 0952    PT LONG TERM GOAL #1   Title pt will improve gait speed to 0.78m/s for normalized home mobilty with LRAD.    Baseline 0.66 without AD   Time 4   Period Weeks   Status New   PT LONG TERM GOAL #2   Title pt will improve berg balance score by 6 pts to reduce risk of falls .   Time 4   Period Weeks   Status New   PT LONG TERM GOAL #3   Title pt will reduce 5x sit to stand time by 15 s demosntrating improved LE strength.    Baseline 28s with use of UEs   Time 4   Period Weeks   Status New               Plan - 11/23/2015 0949    Clinical Impression Statement pt presents with recent history of a fall in Dec 2016. she has a history of CVA x 2 which is reported to not have residual physcial effects, only STM loss. pt demonstates impaired LE strength R>L, impaired gait, and imbalane and is a high fall risk. pt would benefit from skilled PT services to address this to reduce fall risk and maximize mobility.    Pt will benefit from skilled therapeutic intervention in order to improve on the following deficits Decreased strength;Difficulty walking;Decreased range of motion;Decreased balance;Decreased endurance   Rehab Potential Good   Clinical Impairments Affecting Rehab Potential memory loss from CVAs, hisotry of B TKA, Asthma   PT Frequency 2x / week   PT Duration 4 weeks   PT Treatment/Interventions Patient/family education;Neuromuscular re-education;Balance training;Therapeutic exercise;Therapeutic activities;Functional mobility training;Stair training;Gait training;Vestibular   PT Home Exercise Plan progress current HEP (pt currently performs standing hip abd/ext/flexion, ankle PF and squats 2x10)          G-Codes - November 23, 2015 0954    Functional Assessment Tool Used berg/96mwalk/5xsittostand   Functional  Limitation Mobility: Walking and moving around   Mobility: Walking and Moving Around Current Status [id number]) At least 20 percent but less than 40 percent impaired, limited or restricted   Mobility: Walking and Moving Around Goal Status [phone]) At least 1 percent but less than 20 percent impaired, limited or restricted       Problem List Patient Active Problem List   Diagnosis Date Noted  . Arthritis 09/24/2015  . Airway hyperreactivity 09/24/2015  . Benign hypertension 09/24/2015  . Chronic kidney disease (CKD), stage III (moderate) 09/24/2015  . Colitis 09/24/2015  . Esophagitis, reflux 09/24/2015  . H/O stroke without residual deficits 09/24/2015  . Decreased potassium in the blood 09/24/2015  . Cutaneous malignant melanoma ([place]) 09/24/2015  . Adiposity 09/24/2015  . Temporary cerebral vascular dysfunction 09/24/2015  . Hypercholesteremia 08/17/2015   Gorden Harms. Eulice Rutledge, PT, DPT [phone]  Danny Yackley 11-23-2015, 9:55 AM  [place] MAIN [facility] SERVICES [address] [place], [place], [address] Phone: [phone]   Fax:  [phone]  Name: Susan Campos MRN: [id number] Date of Birth: 23-Apr-1933

## 2015-11-04 ENCOUNTER — Other Ambulatory Visit: Payer: Self-pay

## 2015-11-04 ENCOUNTER — Telehealth: Payer: Self-pay | Admitting: Family Medicine

## 2015-11-04 DIAGNOSIS — E7800 Pure hypercholesterolemia, unspecified: Secondary | ICD-10-CM

## 2015-11-04 DIAGNOSIS — I10 Essential (primary) hypertension: Secondary | ICD-10-CM

## 2015-11-04 MED ORDER — TRIAMTERENE-HCTZ 75-50 MG PO TABS: 1.0000 | ORAL_TABLET | Freq: Every day | ORAL | Status: DC

## 2015-11-04 MED ORDER — SIMVASTATIN 5 MG PO TABS: 5.0000 mg | ORAL_TABLET | Freq: Every evening | ORAL | Status: DC

## 2015-11-04 NOTE — Telephone Encounter (Signed)
Pt requesting refill on statin and BP medication. Renaldo Fiddler, CMA

## 2015-11-10 ENCOUNTER — Ambulatory Visit: Payer: Medicare Other

## 2015-11-10 DIAGNOSIS — R2681 Unsteadiness on feet: Secondary | ICD-10-CM

## 2015-11-10 NOTE — Therapy (Signed)
[place] MAIN [facility] SERVICES [address] [place], [place], [address] Phone: [phone]   Fax:  [phone]  Physical Therapy Treatment  Patient Details  Name: Susan Campos MRN: [id number] Date of Birth: 1933-07-19 Referring Provider: Venia Minks  Encounter Date: 11/10/2015      PT End of Session - 11/10/15 1031    Visit Number 2   Number of Visits 9   Date for PT Re-Evaluation 12/01/15   Authorization Type 2/10   PT Start Time 0945   PT Stop Time 1030   PT Time Calculation (min) 45 min   Equipment Utilized During Treatment Gait belt   Activity Tolerance Patient tolerated treatment well   Behavior During Therapy [facility] for tasks assessed/performed  needs redirection to stay on task      Past Medical History  Diagnosis Date  . GERD (gastroesophageal reflux disease)   . Hypercholesterolemia   . Cancer ([place])     skin  . Swelling of both lower extremities     feet and legs  . Hypertension   . Asthma     HX OF  . Arthritis     HIPS  . Vertigo     OCCASIONAL  . Orthopnea     sleeps in recliner, cannot lay flat on her back  . Stroke [facility])     CVA 2012/ MEMORY LOSS, 1999, 1 more in 2011    Past Surgical History  Procedure Laterality Date  . Knee arthroscopy    . Replacement total knee bilateral    . Uterine suspension    . Cataract extraction Right   . Cataract extraction w/phaco Left 05/27/2015    Procedure: CATARACT EXTRACTION PHACO AND INTRAOCULAR LENS PLACEMENT (IOC);  Surgeon: Leandrew Koyanagi, MD;  Location: [place];  Service: Ophthalmology;  Laterality: Left;  Marland Kitchen Mouth surgery    . Melanoma excision      from chest    There were no vitals filed for this visit.      Subjective Assessment - 11/10/15 1000    Subjective pt reports she is feeling good!   Currently in Pain? No/denies       therex: Nustep L 1 x 4 min no charge O2: 98% despite wheezing   Standing mini squat 2x10; standing  ankle DF/PF 2x10; standing hip abd 2x10; standing march 2x10; standing hip extension 2x10; standing hip flexion 2x10, side stepping x 5 laps, fwd/retro walk x 5 laps All holding counter top. All bilateral Pt requires min verbal and tactile cues for proper exercise performance                               PT Long Term Goals - 11/03/15 0952    PT LONG TERM GOAL #1   Title pt will improve gait speed to 0.100m/s for normalized home mobilty with LRAD.    Baseline 0.66 without AD   Time 4   Period Weeks   Status New   PT LONG TERM GOAL #2   Title pt will improve berg balance score by 6 pts to reduce risk of falls .   Time 4   Period Weeks   Status New   PT LONG TERM GOAL #3   Title pt will reduce 5x sit to stand time by 15 s demosntrating improved LE strength.    Baseline 28s with use of UEs   Time 4   Period Weeks  Status New               Plan - 11/10/15 1031    Clinical Impression Statement pt did well with progression of HEP. she was tired at end of session. she needed less cues for redirection this treatment. she was more focused.    Rehab Potential Good   Clinical Impairments Affecting Rehab Potential memory loss from CVAs, hisotry of B TKA, Asthma   PT Frequency 2x / week   PT Duration 4 weeks   PT Treatment/Interventions Patient/family education;Neuromuscular re-education;Balance training;Therapeutic exercise;Therapeutic activities;Functional mobility training;Stair training;Gait training;Vestibular      Patient will benefit from skilled therapeutic intervention in order to improve the following deficits and impairments:  Decreased strength, Difficulty walking, Decreased range of motion, Decreased balance, Decreased endurance  Visit Diagnosis: Unsteadiness on feet     Problem List Patient Active Problem List   Diagnosis Date Noted  . Arthritis 09/24/2015  . Airway hyperreactivity 09/24/2015  . Benign hypertension 09/24/2015  . Chronic  kidney disease (CKD), stage III (moderate) 09/24/2015  . Colitis 09/24/2015  . Esophagitis, reflux 09/24/2015  . H/O stroke without residual deficits 09/24/2015  . Decreased potassium in the blood 09/24/2015  . Cutaneous malignant melanoma ([place]) 09/24/2015  . Adiposity 09/24/2015  . Temporary cerebral vascular dysfunction 09/24/2015  . Hypercholesteremia 08/17/2015   Gorden Harms. Jaquanda Wickersham, PT, DPT [phone]  Kyilee Gregg 11/10/2015, 10:32 AM  [place] MAIN [facility] SERVICES [address] [place], [place], [address] Phone: [phone]   Fax:  [phone]  Name: Susan Campos MRN: [id number] Date of Birth: Nov 20, 1932

## 2015-11-10 NOTE — Patient Instructions (Signed)
[email or web address] Standing mini squat 2x10; standing ankle DF/PF 2x10; standing hip abd 2x10; standing march 2x10; standing hip extension 2x10; standing hip flexion 2x10, side stepping x 5 laps, fwd/retro walk x 5 laps All holding counter top. All bilateral

## 2015-11-12 ENCOUNTER — Ambulatory Visit: Payer: Medicare Other

## 2015-11-12 DIAGNOSIS — R2681 Unsteadiness on feet: Secondary | ICD-10-CM | POA: Diagnosis not present

## 2015-11-12 NOTE — Therapy (Signed)
[place] MAIN [facility] SERVICES [address] [place], [place], [address] Phone: [phone]   Fax:  [phone]  Physical Therapy Treatment  Patient Details  Name: Susan Campos MRN: [id number] Date of Birth: 1933-01-08 Referring Provider: Venia Minks  Encounter Date: 11/12/2015      PT End of Session - 11/12/15 0902    Visit Number 3   Number of Visits 9   Date for PT Re-Evaluation 12/01/15   Authorization Type 3/10   PT Start Time 0845   PT Stop Time 0930   PT Time Calculation (min) 45 min   Equipment Utilized During Treatment Gait belt   Activity Tolerance Patient tolerated treatment well   Behavior During Therapy [facility] for tasks assessed/performed      Past Medical History  Diagnosis Date  . GERD (gastroesophageal reflux disease)   . Hypercholesterolemia   . Cancer ([place])     skin  . Swelling of both lower extremities     feet and legs  . Hypertension   . Asthma     HX OF  . Arthritis     HIPS  . Vertigo     OCCASIONAL  . Orthopnea     sleeps in recliner, cannot lay flat on her back  . Stroke [facility])     CVA 2012/ MEMORY LOSS, 1999, 1 more in 2011    Past Surgical History  Procedure Laterality Date  . Knee arthroscopy    . Replacement total knee bilateral    . Uterine suspension    . Cataract extraction Right   . Cataract extraction w/phaco Left 05/27/2015    Procedure: CATARACT EXTRACTION PHACO AND INTRAOCULAR LENS PLACEMENT (IOC);  Surgeon: Leandrew Koyanagi, MD;  Location: [place];  Service: Ophthalmology;  Laterality: Left;  Marland Kitchen Mouth surgery    . Melanoma excision      from chest    There were no vitals filed for this visit.      Subjective Assessment - 11/12/15 0854    Subjective pt reports she was tired after the exercises and HEP   Currently in Pain? No/denies         therex    Nustep L 3 x 3 min no charge warm up Leg press 75lbs 3x10 Fwd step up 1 handrail x 10 each leg    NMR Side step- weight shift x 12 each leg  Fwd step and back with weight shift x 12 each leg Retro step and back fwd with weight shift x 12 each leg  Standing on [employer] VOR 1 exercise vert/horiz 30s x 2 each Mini squat on [employer] x 10 Wt shift on AIRES SS and AP x 2 min each   pt requires CGA for safety on balance exercises                       PT Education - 11/12/15 0901    Education provided Yes   Education Details home safety    Person(s) Educated Patient   Methods Explanation   Comprehension Verbalized understanding             PT Long Term Goals - 11/03/15 0952    PT LONG TERM GOAL #1   Title pt will improve gait speed to 0.26m/s for normalized home mobilty with LRAD.    Baseline 0.66 without AD   Time 4   Period Weeks   Status New   PT LONG TERM GOAL #2  Title pt will improve berg balance score by 6 pts to reduce risk of falls .   Time 4   Period Weeks   Status New   PT LONG TERM GOAL #3   Title pt will reduce 5x sit to stand time by 15 s demosntrating improved LE strength.    Baseline 28s with use of UEs   Time 4   Period Weeks   Status New               Plan - 11/12/15 0935    Clinical Impression Statement pt did well with progression of exercise. she did have some light headedness but her BP was fine (150/44mmHg). pt needs min cues to stay on task and be reminded what shes doing.    Rehab Potential Good   Clinical Impairments Affecting Rehab Potential memory loss from CVAs, hisotry of B TKA, Asthma   PT Frequency 2x / week   PT Duration 4 weeks   PT Treatment/Interventions Patient/family education;Neuromuscular re-education;Balance training;Therapeutic exercise;Therapeutic activities;Functional mobility training;Stair training;Gait training;Vestibular      Patient will benefit from skilled therapeutic intervention in order to improve the following deficits and impairments:  Decreased strength, Difficulty walking, Decreased  range of motion, Decreased balance, Decreased endurance  Visit Diagnosis: Unsteadiness on feet     Problem List Patient Active Problem List   Diagnosis Date Noted  . Arthritis 09/24/2015  . Airway hyperreactivity 09/24/2015  . Benign hypertension 09/24/2015  . Chronic kidney disease (CKD), stage III (moderate) 09/24/2015  . Colitis 09/24/2015  . Esophagitis, reflux 09/24/2015  . H/O stroke without residual deficits 09/24/2015  . Decreased potassium in the blood 09/24/2015  . Cutaneous malignant melanoma ([place]) 09/24/2015  . Adiposity 09/24/2015  . Temporary cerebral vascular dysfunction 09/24/2015  . Hypercholesteremia 08/17/2015   Gorden Harms. Jolleen Seman, PT, DPT [phone]  Kimila Papaleo 11/12/2015, 9:37 AM  [place] MAIN [facility] SERVICES [address] [place], [place], [address] Phone: [phone]   Fax:  [phone]  Name: Susan Campos MRN: [id number] Date of Birth: 1933/04/20

## 2015-11-17 ENCOUNTER — Ambulatory Visit: Payer: Medicare Other

## 2015-11-17 DIAGNOSIS — R2681 Unsteadiness on feet: Secondary | ICD-10-CM | POA: Diagnosis not present

## 2015-11-17 NOTE — Therapy (Signed)
[place] MAIN [facility] SERVICES [address] [place], [place], [address] Phone: [phone]   Fax:  [phone]  Physical Therapy Treatment  Patient Details  Name: Susan Campos MRN: [id number] Date of Birth: 1933-02-14 Referring Provider: Venia Minks  Encounter Date: 11/17/2015      PT End of Session - 11/17/15 0951    Visit Number 4   Number of Visits 9   Date for PT Re-Evaluation 12/01/15   Authorization Time Period 4/10   PT Start Time 0900   PT Stop Time 0945   PT Time Calculation (min) 45 min   Equipment Utilized During Treatment Gait belt   Activity Tolerance Patient tolerated treatment well   Behavior During Therapy [facility] for tasks assessed/performed      Past Medical History  Diagnosis Date  . GERD (gastroesophageal reflux disease)   . Hypercholesterolemia   . Cancer ([place])     skin  . Swelling of both lower extremities     feet and legs  . Hypertension   . Asthma     HX OF  . Arthritis     HIPS  . Vertigo     OCCASIONAL  . Orthopnea     sleeps in recliner, cannot lay flat on her back  . Stroke [facility])     CVA 2012/ MEMORY LOSS, 1999, 1 more in 2011    Past Surgical History  Procedure Laterality Date  . Knee arthroscopy    . Replacement total knee bilateral    . Uterine suspension    . Cataract extraction Right   . Cataract extraction w/phaco Left 05/27/2015    Procedure: CATARACT EXTRACTION PHACO AND INTRAOCULAR LENS PLACEMENT (IOC);  Surgeon: Leandrew Koyanagi, MD;  Location: [place];  Service: Ophthalmology;  Laterality: Left;  Marland Kitchen Mouth surgery    . Melanoma excision      from chest    There were no vitals filed for this visit.      Subjective Assessment - 11/17/15 0921    Subjective pt reports she hasnt done HEP over the weekend because she was busy with Ivor Costa   Patient Stated Goals improve balance    Currently in Pain? No/denies     therex   Leg press 90lbs 3x15 Fwd step up  6inch x 10 each leg single UE  Side step up lateral x10  4 square step with math problems x 4 min Standing resisted hip flexion, abduction ,adduction and extension with red       Band x 10 each way BLE Sit to stand with red band around knees x 12 Pt requires min verbal and tactile cues for proper exercise performance   NMR:  1/2 roll AP balance 30s x 5 Toe taps on AIREX+ 4in step 2x20 NBOS on AIREX 30s x 5  pt requires CGA for safety on balance exercises                                 PT Long Term Goals - 11/03/15 [id number]    PT LONG TERM GOAL #1   Title pt will improve gait speed to 0.39m/s for normalized home mobilty with LRAD.    Baseline 0.66 without AD   Time 4   Period Weeks   Status New   PT LONG TERM GOAL #2   Title pt will improve berg balance score by 6 pts to reduce risk of falls .  Time 4   Period Weeks   Status New   PT LONG TERM GOAL #3   Title pt will reduce 5x sit to stand time by 15 s demosntrating improved LE strength.    Baseline 28s with use of UEs   Time 4   Period Weeks   Status New               Plan - 11/17/15 [id number]    Clinical Impression Statement pt did well with progression of strength and balance exercises. she gets out of breath, but is able to complete exercises. pt needs CGA for balance exercises, and some redirection to stay on task    Rehab Potential Good   Clinical Impairments Affecting Rehab Potential memory loss from CVAs, hisotry of B TKA, Asthma   PT Frequency 2x / week   PT Duration 4 weeks   PT Treatment/Interventions Patient/family education;Neuromuscular re-education;Balance training;Therapeutic exercise;Therapeutic activities;Functional mobility training;Stair training;Gait training;Vestibular      Patient will benefit from skilled therapeutic intervention in order to improve the following deficits and impairments:  Decreased strength, Difficulty walking, Decreased range of motion, Decreased balance,  Decreased endurance  Visit Diagnosis: Unsteadiness on feet     Problem List Patient Active Problem List   Diagnosis Date Noted  . Arthritis 09/24/2015  . Airway hyperreactivity 09/24/2015  . Benign hypertension 09/24/2015  . Chronic kidney disease (CKD), stage III (moderate) 09/24/2015  . Colitis 09/24/2015  . Esophagitis, reflux 09/24/2015  . H/O stroke without residual deficits 09/24/2015  . Decreased potassium in the blood 09/24/2015  . Cutaneous malignant melanoma ([place]) 09/24/2015  . Adiposity 09/24/2015  . Temporary cerebral vascular dysfunction 09/24/2015  . Hypercholesteremia 08/17/2015   Gorden Harms. Jaidence Geisler, PT, DPT [phone]  Sonda Coppens 11/17/2015, 9:54 AM  [place] MAIN [facility] SERVICES [address] [place], [place], [address] Phone: [phone]   Fax:  [phone]  Name: Susan Campos MRN: [id number] Date of Birth: 04-20-1933

## 2015-11-19 ENCOUNTER — Ambulatory Visit: Payer: Medicare Other

## 2015-11-19 DIAGNOSIS — R2681 Unsteadiness on feet: Secondary | ICD-10-CM

## 2015-11-19 NOTE — Therapy (Signed)
[place] MAIN [facility] SERVICES [address] [place], [place], [address] Phone: [phone]   Fax:  [phone]  Physical Therapy Treatment  Patient Details  Name: Susan Campos MRN: [id number] Date of Birth: 1933/03/06 Referring Provider: Venia Minks  Encounter Date: 11/19/2015      PT End of Session - 11/19/15 0941    Visit Number (p) 5   Number of Visits (p) 9   Date for PT Re-Evaluation (p) 12/01/15   Authorization Time Period (p) 5/10   PT Start Time (p) 0925   PT Stop Time (p) 1010   PT Time Calculation (min) (p) 45 min   Equipment Utilized During Treatment (p) Gait belt   Activity Tolerance (p) Patient tolerated treatment well   Behavior During Therapy (p) WFL for tasks assessed/performed      Past Medical History  Diagnosis Date  . GERD (gastroesophageal reflux disease)   . Hypercholesterolemia   . Cancer ([place])     skin  . Swelling of both lower extremities     feet and legs  . Hypertension   . Asthma     HX OF  . Arthritis     HIPS  . Vertigo     OCCASIONAL  . Orthopnea     sleeps in recliner, cannot lay flat on her back  . Stroke [facility])     CVA 2012/ MEMORY LOSS, 1999, 1 more in 2011    Past Surgical History  Procedure Laterality Date  . Knee arthroscopy    . Replacement total knee bilateral    . Uterine suspension    . Cataract extraction Right   . Cataract extraction w/phaco Left 05/27/2015    Procedure: CATARACT EXTRACTION PHACO AND INTRAOCULAR LENS PLACEMENT (IOC);  Surgeon: Leandrew Koyanagi, MD;  Location: [place];  Service: Ophthalmology;  Laterality: Left;  Marland Kitchen Mouth surgery    . Melanoma excision      from chest    There were no vitals filed for this visit.      Subjective Assessment - 11/19/15 0936    Subjective pt reports she is getting up and down out of the chair better   Patient Stated Goals improve balance    Currently in Pain? No/denies      Therex:   Nustep L 3  x 5 min  No charge 90lbs 3x15 leg press BLE Resisted walking in Matrix 7.5lbs x 10 laps fwd march, x 3 laps side stepping both R / L  pt requires CGA for safety on balance exercises  Pt requires min verbal and tactile cues for proper exercise performance       NMR: 1/2 roll AP balance flat down 30s x 5 no UE. Verbal cues for upright posture  BOSU weight shift AP/SS with UE support 2x10 each BOSU mini squat with UE support x 10 BOSU balance flat top up 30s x 5  pt requires CGA for safety on balance exercises                           PT Long Term Goals - 11/03/15 [id number]    PT LONG TERM GOAL #1   Title pt will improve gait speed to 0.60m/s for normalized home mobilty with LRAD.    Baseline 0.66 without AD   Time 4   Period Weeks   Status New   PT LONG TERM GOAL #2   Title pt will improve berg balance score  by 6 pts to reduce risk of falls .   Time 4   Period Weeks   Status New   PT LONG TERM GOAL #3   Title pt will reduce 5x sit to stand time by 15 s demosntrating improved LE strength.    Baseline 28s with use of UEs   Time 4   Period Weeks   Status New               Plan - 11/19/15 1010    Clinical Impression Statement pt continues to do well with progressing balance and strength exercises. she reports fatigue in the legs today especially the calfs. pt was instructed on seated calf stretch with strap.    Rehab Potential Good   Clinical Impairments Affecting Rehab Potential memory loss from CVAs, hisotry of B TKA, Asthma   PT Frequency 2x / week   PT Duration 4 weeks   PT Treatment/Interventions Patient/family education;Neuromuscular re-education;Balance training;Therapeutic exercise;Therapeutic activities;Functional mobility training;Stair training;Gait training;Vestibular      Patient will benefit from skilled therapeutic intervention in order to improve the following deficits and impairments:  Decreased strength, Difficulty walking, Decreased  range of motion, Decreased balance, Decreased endurance  Visit Diagnosis: Unsteadiness on feet     Problem List Patient Active Problem List   Diagnosis Date Noted  . Arthritis 09/24/2015  . Airway hyperreactivity 09/24/2015  . Benign hypertension 09/24/2015  . Chronic kidney disease (CKD), stage III (moderate) 09/24/2015  . Colitis 09/24/2015  . Esophagitis, reflux 09/24/2015  . H/O stroke without residual deficits 09/24/2015  . Decreased potassium in the blood 09/24/2015  . Cutaneous malignant melanoma ([place]) 09/24/2015  . Adiposity 09/24/2015  . Temporary cerebral vascular dysfunction 09/24/2015  . Hypercholesteremia 08/17/2015   Gorden Harms. Siriah Treat, PT, DPT [phone]  Dariel Betzer 11/19/2015, 10:12 AM  [place] MAIN [facility] SERVICES [address] [place], [place], [address] Phone: [phone]   Fax:  [phone]  Name: Susan Campos MRN: [id number] Date of Birth: 01-06-1933

## 2015-11-24 ENCOUNTER — Ambulatory Visit: Payer: Medicare Other

## 2015-11-24 DIAGNOSIS — R2681 Unsteadiness on feet: Secondary | ICD-10-CM | POA: Diagnosis not present

## 2015-11-25 NOTE — Therapy (Signed)
[place] MAIN [facility] SERVICES [address] [place], [place], [address] Phone: [phone]   Fax:  [phone]  Physical Therapy Treatment  Patient Details  Name: Susan Campos MRN: [id number] Date of Birth: [birth year] Referring Provider: Venia Minks  Encounter Date: 11/24/2015      PT End of Session - 11/25/15 0756    Visit Number 6   Number of Visits 9   Date for PT Re-Evaluation 12/01/15   Authorization Time Period 6/10   PT Start Time 0915   PT Stop Time 1000   PT Time Calculation (min) 45 min   Equipment Utilized During Treatment Gait belt   Activity Tolerance Patient tolerated treatment well   Behavior During Therapy [facility] for tasks assessed/performed      Past Medical History  Diagnosis Date  . GERD (gastroesophageal reflux disease)   . Hypercholesterolemia   . Cancer ([place])     skin  . Swelling of both lower extremities     feet and legs  . Hypertension   . Asthma     HX OF  . Arthritis     HIPS  . Vertigo     OCCASIONAL  . Orthopnea     sleeps in recliner, cannot lay flat on her back  . Stroke [facility])     CVA 2012/ MEMORY LOSS, 1999, 1 more in 2011    Past Surgical History  Procedure Laterality Date  . Knee arthroscopy    . Replacement total knee bilateral    . Uterine suspension    . Cataract extraction Right   . Cataract extraction w/phaco Left 05/27/2015    Procedure: CATARACT EXTRACTION PHACO AND INTRAOCULAR LENS PLACEMENT (IOC);  Surgeon: Leandrew Koyanagi, MD;  Location: [place];  Service: Ophthalmology;  Laterality: Left;  Marland Kitchen Mouth surgery    . Melanoma excision      from chest    There were no vitals filed for this visit.      Subjective Assessment - 11/25/15 0756    Subjective pt reports she feels more stable   Patient Stated Goals improve balance    Currently in Pain? No/denies        therex: in fitness center: Leg press: 5 plates 3x10 Hamstring curls 4  plates  [id number] Knee extension 2 plates 3x10  Side stepping red band x 5 laps in // bars with UE support Fwd step up 1 UE x 10 each leg 6in Pt requires min verbal and tactile cues for proper exercise performance   NMR: EO/EC on AIREX 30s x 5  NBOS with vertical and horizontal head turns 2x10 each  pt requires CGA for safety on balance exercises                            PT Education - 11/25/15 0756    Education provided Yes   Education Details discussed benefits for forever fit/silver sneakers   Person(s) Educated Patient   Methods Explanation   Comprehension Verbalized understanding             PT Long Term Goals - 11/03/15 0952    PT LONG TERM GOAL #1   Title pt will improve gait speed to 0.23m/s for normalized home mobilty with LRAD.    Baseline 0.66 without AD   Time 4   Period Weeks   Status New   PT LONG TERM GOAL #2   Title pt will improve berg balance  score by 6 pts to reduce risk of falls .   Time 4   Period Weeks   Status New   PT LONG TERM GOAL #3   Title pt will reduce 5x sit to stand time by 15 s demosntrating improved LE strength.    Baseline 28s with use of UEs   Time 4   Period Weeks   Status New               Plan - 11/25/15 0757    Clinical Impression Statement pt did well with progression of strengthening. she was fatigued afterwards. pt demonstrates improved stability with balance exercises including those with her eyes closed    Rehab Potential Good   Clinical Impairments Affecting Rehab Potential memory loss from CVAs, hisotry of B TKA, Asthma   PT Frequency 2x / week   PT Duration 4 weeks   PT Treatment/Interventions Patient/family education;Neuromuscular re-education;Balance training;Therapeutic exercise;Therapeutic activities;Functional mobility training;Stair training;Gait training;Vestibular      Patient will benefit from skilled therapeutic intervention in order to improve the following deficits and impairments:   Decreased strength, Difficulty walking, Decreased range of motion, Decreased balance, Decreased endurance  Visit Diagnosis: Unsteadiness on feet     Problem List Patient Active Problem List   Diagnosis Date Noted  . Arthritis 09/24/2015  . Airway hyperreactivity 09/24/2015  . Benign hypertension 09/24/2015  . Chronic kidney disease (CKD), stage III (moderate) 09/24/2015  . Colitis 09/24/2015  . Esophagitis, reflux 09/24/2015  . H/O stroke without residual deficits 09/24/2015  . Decreased potassium in the blood 09/24/2015  . Cutaneous malignant melanoma ([place]) 09/24/2015  . Adiposity 09/24/2015  . Temporary cerebral vascular dysfunction 09/24/2015  . Hypercholesteremia 08/17/2015   Gorden Harms. Clydean Posas, PT, DPT [phone]  Jamile Rekowski 11/25/2015, 7:58 AM  [place] MAIN [facility] SERVICES [address] [place], [place], [address] Phone: [phone]   Fax:  [phone]  Name: Jammy Greaney MRN: [id number] Date of Birth: August 29, 1932

## 2015-11-26 ENCOUNTER — Ambulatory Visit: Payer: Medicare Other

## 2015-11-26 DIAGNOSIS — R2681 Unsteadiness on feet: Secondary | ICD-10-CM | POA: Diagnosis not present

## 2015-11-26 NOTE — Therapy (Signed)
[place] MAIN [facility] SERVICES [address] [place], [place], [address] Phone: [phone]   Fax:  [phone]  Physical Therapy Treatment  Patient Details  Name: Susan Campos MRN: [id number] Date of Birth: 21-Aug-1932 Referring Provider: Venia Minks  Encounter Date: 11/26/2015      PT End of Session - 11/26/15 0917    Visit Number 7   Number of Visits 9   Date for PT Re-Evaluation 12/01/15   Authorization Time Period 7/10   PT Start Time 0910   PT Stop Time 0955   PT Time Calculation (min) 45 min   Equipment Utilized During Treatment Gait belt   Activity Tolerance Patient tolerated treatment well   Behavior During Therapy [facility] for tasks assessed/performed      Past Medical History  Diagnosis Date  . GERD (gastroesophageal reflux disease)   . Hypercholesterolemia   . Cancer ([place])     skin  . Swelling of both lower extremities     feet and legs  . Hypertension   . Asthma     HX OF  . Arthritis     HIPS  . Vertigo     OCCASIONAL  . Orthopnea     sleeps in recliner, cannot lay flat on her back  . Stroke [facility])     CVA 2012/ MEMORY LOSS, 1999, 1 more in 2011    Past Surgical History  Procedure Laterality Date  . Knee arthroscopy    . Replacement total knee bilateral    . Uterine suspension    . Cataract extraction Right   . Cataract extraction w/phaco Left 05/27/2015    Procedure: CATARACT EXTRACTION PHACO AND INTRAOCULAR LENS PLACEMENT (IOC);  Surgeon: Leandrew Koyanagi, MD;  Location: [place];  Service: Ophthalmology;  Laterality: Left;  Marland Kitchen Mouth surgery    . Melanoma excision      from chest    There were no vitals filed for this visit.      Subjective Assessment - 11/26/15 0917    Subjective pt reports she was sore in her legs after last session   Patient Stated Goals improve balance    Currently in Pain? No/denies     NMR    Nustep: L3 x 5 min warm up Leg press 75lbs 3x15 Heel raise  on leg press 75lbs 2x15  Resisted walking 12.5lbs x 5 laps fwd In hallway finding cards on wall x 6 laps cues for speed and field scanning  Standing on AIREX bending to pick up ball then toss in basket 2x 20                         PT Education - 11/25/15 0756    Education provided Yes   Education Details discussed benefits for forever fit/silver sneakers   Person(s) Educated Patient   Methods Explanation   Comprehension Verbalized understanding             PT Long Term Goals - 11/03/15 0952    PT LONG TERM GOAL #1   Title pt will improve gait speed to 0.39m/s for normalized home mobilty with LRAD.    Baseline 0.66 without AD   Time 4   Period Weeks   Status New   PT LONG TERM GOAL #2   Title pt will improve berg balance score by 6 pts to reduce risk of falls .   Time 4   Period Weeks   Status New  PT LONG TERM GOAL #3   Title pt will reduce 5x sit to stand time by 15 s demosntrating improved LE strength.    Baseline 28s with use of UEs   Time 4   Period Weeks   Status New               Plan - 11/26/15 1009    Clinical Impression Statement pt did well with progression of NMR. also did some bending down exercise as this is how she fell originally. pt seems to be progressing well. will do PN next week.    Rehab Potential Good   Clinical Impairments Affecting Rehab Potential memory loss from CVAs, hisotry of B TKA, Asthma   PT Frequency 2x / week   PT Duration 4 weeks   PT Treatment/Interventions Patient/family education;Neuromuscular re-education;Balance training;Therapeutic exercise;Therapeutic activities;Functional mobility training;Stair training;Gait training;Vestibular      Patient will benefit from skilled therapeutic intervention in order to improve the following deficits and impairments:  Decreased strength, Difficulty walking, Decreased range of motion, Decreased balance, Decreased endurance  Visit Diagnosis: Unsteadiness on  feet     Problem List Patient Active Problem List   Diagnosis Date Noted  . Arthritis 09/24/2015  . Airway hyperreactivity 09/24/2015  . Benign hypertension 09/24/2015  . Chronic kidney disease (CKD), stage III (moderate) 09/24/2015  . Colitis 09/24/2015  . Esophagitis, reflux 09/24/2015  . H/O stroke without residual deficits 09/24/2015  . Decreased potassium in the blood 09/24/2015  . Cutaneous malignant melanoma ([place]) 09/24/2015  . Adiposity 09/24/2015  . Temporary cerebral vascular dysfunction 09/24/2015  . Hypercholesteremia 08/17/2015   Gorden Harms. Jaine Estabrooks, PT, DPT [phone]  Maya Scholer 11/26/2015, 10:10 AM  [place] MAIN [facility] SERVICES [address] [place], [place], [address] Phone: [phone]   Fax:  [phone]  Name: Susan Campos MRN: [id number] Date of Birth: 10-Aug-1932

## 2015-11-27 ENCOUNTER — Telehealth: Payer: Self-pay | Admitting: Family Medicine

## 2015-11-27 DIAGNOSIS — E7800 Pure hypercholesterolemia, unspecified: Secondary | ICD-10-CM

## 2015-11-27 NOTE — Telephone Encounter (Signed)
Ok to print out lab slip. Thanks.  

## 2015-11-27 NOTE — Telephone Encounter (Signed)
Pt is requesting a lab slip to recheck cholesterol.  CB#[phone]/MW

## 2015-11-30 NOTE — Telephone Encounter (Signed)
Pt called to see if lab slip was ready. Thanks TNP

## 2015-11-30 NOTE — Telephone Encounter (Signed)
Lab sheet at the front desk; left message advising pt.  Thanks,   -[place]le Lamons  

## 2015-12-01 ENCOUNTER — Ambulatory Visit: Payer: Medicare Other | Attending: Family Medicine

## 2015-12-01 DIAGNOSIS — R2681 Unsteadiness on feet: Secondary | ICD-10-CM | POA: Insufficient documentation

## 2015-12-01 NOTE — Therapy (Signed)
[place] MAIN [facility] SERVICES [address] [place], [place], [address] Phone: [phone]   Fax:  [phone]  Physical Therapy Treatment/ Progress note  Patient Details  Name: Susan Campos MRN: [id number] Date of Birth: 01-15-1933 Referring Provider: Venia Minks  Encounter Date: 12/01/2015      PT End of Session - 12/01/15 1148    Visit Number 8   Number of Visits 15   Date for PT Re-Evaluation 12/17/15   Authorization Time Period 1/10   PT Start Time 1045   PT Stop Time 1130   PT Time Calculation (min) 45 min   Equipment Utilized During Treatment Gait belt   Activity Tolerance Patient tolerated treatment well   Behavior During Therapy [facility] for tasks assessed/performed      Past Medical History  Diagnosis Date  . GERD (gastroesophageal reflux disease)   . Hypercholesterolemia   . Cancer ([place])     skin  . Swelling of both lower extremities     feet and legs  . Hypertension   . Asthma     HX OF  . Arthritis     HIPS  . Vertigo     OCCASIONAL  . Orthopnea     sleeps in recliner, cannot lay flat on her back  . Stroke [facility])     CVA 2012/ MEMORY LOSS, 1999, 1 more in 2011    Past Surgical History  Procedure Laterality Date  . Knee arthroscopy    . Replacement total knee bilateral    . Uterine suspension    . Cataract extraction Right   . Cataract extraction w/phaco Left 05/27/2015    Procedure: CATARACT EXTRACTION PHACO AND INTRAOCULAR LENS PLACEMENT (IOC);  Surgeon: Leandrew Koyanagi, MD;  Location: [place];  Service: Ophthalmology;  Laterality: Left;  Marland Kitchen Mouth surgery    . Melanoma excision      from chest    There were no vitals filed for this visit.      Subjective Assessment - 12/01/15 1147    Subjective pt reports she feels a lot stronger and more confident   Patient Stated Goals improve balance, walk outside with more confidence, walk carrying something   Currently in Pain? No/denies      Therex: PT reassessed outcome measures and progress towards goals as follows :      [facility] PT Assessment - 12/01/15 0001    Standardized Balance Assessment   Standardized Balance Assessment Dynamic Gait Index   Five times sit to stand comments  14.9s   10 Meter Walk 0.67ms   Berg Balance Test   Sit to Stand Able to stand without using hands and stabilize independently   Standing Unsupported Able to stand safely 2 minutes   Sitting with Back Unsupported but Feet Supported on Floor or Stool Able to sit safely and securely 2 minutes   Stand to Sit Sits safely with minimal use of hands   Transfers Able to transfer safely, minor use of hands   Standing Unsupported with Eyes Closed Able to stand 10 seconds with supervision   Standing Ubsupported with Feet Together Able to place feet together independently and stand 1 minute safely   From Standing, Reach Forward with Outstretched Arm Can reach confidently >25 cm (10")   From Standing Position, Pick up Object from Floor Able to pick up shoe safely and easily   From Standing Position, Turn to Look Behind Over each Shoulder Turn sideways only but maintains balance   Turn  360 Degrees Able to turn 360 degrees safely in 4 seconds or less   Standing Unsupported, Alternately Place Feet on Step/Stool Able to stand independently and safely and complete 8 steps in 20 seconds   Standing Unsupported, One Foot in Front Able to plae foot ahead of the other independently and hold 30 seconds   Standing on One Leg Tries to lift leg/unable to hold 3 seconds but remains standing independently   Total Score 49   Dynamic Gait Index   Level Surface Mild Impairment   Change in Gait Speed Mild Impairment   Gait with Horizontal Head Turns Mild Impairment   Gait with Vertical Head Turns Mild Impairment   Gait and Pivot Turn Normal   Step Over Obstacle Mild Impairment   Step Around Obstacles Normal   Steps Mild Impairment   Total Score 18                              PT Education - 12/01/15 1147    Education provided Yes   Education Details progress towards goals, new POC / goals   Person(s) Educated Patient   Methods Explanation   Comprehension Verbalized understanding             PT Long Term Goals - 12/01/15 1151    PT LONG TERM GOAL #1   Title pt will improve gait speed to 0.67ms for normalized home mobilty with LRAD.    Baseline 0.66 without AD   Time 4   Period Weeks   Status Achieved   PT LONG TERM GOAL #2   Title pt will improve berg balance score by 6 pts to reduce risk of falls .   Time 4   Period Weeks   Status Achieved   PT LONG TERM GOAL #3   Title pt will reduce 5x sit to stand time by 15 s demosntrating improved LE strength.    Baseline 28s with use of UEs   Time 4   Period Weeks   Status Achieved   PT LONG TERM GOAL #4   Title pt will score >19/24 on DGI to be less of a fall risk   Time 4   Period Weeks   Status New   PT LONG TERM GOAL #5   Title pt will score >52/56 on berg balance test to be a low fall risk   Time 4   Period Weeks   Status New   Additional Long Term Goals   Additional Long Term Goals Yes   PT LONG TERM GOAL #6   Title pt will ambulate >1.235m to be a normalized community ambulator    Time 4   Period Weeks   Status New   PT LONG TERM GOAL #7   Title pt will be able to walk safely over uneven surfaces x 20022fuch as rock/grass/mulch to be safer for her work/leisure activities    Time 4   Period Weeks   Status New   PT LONG TERM GOAL #8   Title pt will be able to walk carrying 10lb item x 150 ft safely to be able to perform ADLs/ IADLs safely   Time 4   Period Weeks   Status New               Plan - 12/01/15 1149    Clinical Impression Statement pt has made outstanding progress regarding her strength, mobility, balance and gait. she has met  initial PT goals at this time and is now considered a moderate fall risk based on her  outcome measures. pt would benefit from continued skilled PT services to progress dynamic balance, and functional activiites specific to her daily needs and function.    Rehab Potential Good   Clinical Impairments Affecting Rehab Potential memory loss from CVAs, hisotry of B TKA, Asthma   PT Frequency 2x / week   PT Duration 4 weeks   PT Treatment/Interventions Patient/family education;Neuromuscular re-education;Balance training;Therapeutic exercise;Therapeutic activities;Functional mobility training;Stair training;Gait training;Vestibular      Patient will benefit from skilled therapeutic intervention in order to improve the following deficits and impairments:  Decreased strength, Difficulty walking, Decreased range of motion, Decreased balance, Decreased endurance  Visit Diagnosis: Unsteadiness on feet - Plan: PT plan of care cert/re-cert       G-Codes - 24-Dec-2015 1153    Functional Assessment Tool Used berg/DGI/20malk/5xsittostand   Functional Limitation Mobility: Walking and moving around   Mobility: Walking and Moving Around Current Status ([phone] At least 20 percent but less than 40 percent impaired, limited or restricted   Mobility: Walking and Moving Around Goal Status [phone] At least 1 percent but less than 20 percent impaired, limited or restricted      Problem List Patient Active Problem List   Diagnosis Date Noted  . Arthritis 09/24/2015  . Airway hyperreactivity 09/24/2015  . Benign hypertension 09/24/2015  . Chronic kidney disease (CKD), stage III (moderate) 09/24/2015  . Colitis 09/24/2015  . Esophagitis, reflux 09/24/2015  . H/O stroke without residual deficits 09/24/2015  . Decreased potassium in the blood 09/24/2015  . [place] malignant [place] 09/24/2015  . Adiposity 09/24/2015  . Temporary cerebral vascular dysfunction 09/24/2015  . Hypercholesteremia 08/17/2015   AGorden Harms Wah Sabic, PT, DPT #[phone] [place] 525-May-2017 11:55 AM  [place] [facility] [address] [address]Phone: [phone]  Fax:  [phone] Name: Susan YamamotoMRN: [id number] of Birth: 101/23/1934

## 2015-12-03 ENCOUNTER — Ambulatory Visit: Payer: Medicare Other

## 2015-12-03 DIAGNOSIS — R2681 Unsteadiness on feet: Secondary | ICD-10-CM

## 2015-12-03 NOTE — Therapy (Signed)
[place] MAIN [facility] SERVICES [address] [place], [place], [address] Phone: [phone]   Fax:  [phone]  Physical Therapy Treatment  Patient Details  Name: Susan Campos MRN: [id number] Date of Birth: 11/10/1932 Referring Provider: Venia Minks  Encounter Date: 12/03/2015      PT End of Session - 12/03/15 1549    Visit Number 9   Number of Visits 15   Date for PT Re-Evaluation 12/17/15   Authorization Time Period 2/10   PT Start Time 1430   PT Stop Time 1510   PT Time Calculation (min) 40 min   Equipment Utilized During Treatment Gait belt   Activity Tolerance Patient tolerated treatment well   Behavior During Therapy [facility] for tasks assessed/performed      Past Medical History  Diagnosis Date  . GERD (gastroesophageal reflux disease)   . Hypercholesterolemia   . Cancer ([place])     skin  . Swelling of both lower extremities     feet and legs  . Hypertension   . Asthma     HX OF  . Arthritis     HIPS  . Vertigo     OCCASIONAL  . Orthopnea     sleeps in recliner, cannot lay flat on her back  . Stroke [facility])     CVA 2012/ MEMORY LOSS, 1999, 1 more in 2011    Past Surgical History  Procedure Laterality Date  . Knee arthroscopy    . Replacement total knee bilateral    . Uterine suspension    . Cataract extraction Right   . Cataract extraction w/phaco Left 05/27/2015    Procedure: CATARACT EXTRACTION PHACO AND INTRAOCULAR LENS PLACEMENT (IOC);  Surgeon: Leandrew Koyanagi, MD;  Location: [place];  Service: Ophthalmology;  Laterality: Left;  Marland Kitchen Mouth surgery    . Melanoma excision      from chest    There were no vitals filed for this visit.      Subjective Assessment - 12/03/15 1548    Subjective pt reports she feels pretty good   Patient Stated Goals improve balance, walk outside with more confidence, walk carrying something   Currently in Pain? No/denies       NMR: Nustep L 3 x 3 min no  charge warm up [place] (front crossover only ) x 3 laps with UE, , then 5 laps without UE Agility ladder x 15 min various pattern Fwd step over 1/2 roll and back x 20 each leg no UE Side step over 1/2 roll and back no UE x 20  pt requires CGA for safety on balance exercises   Pt requires min verbal and tactile cues for proper exercise performance                                 PT Long Term Goals - 12/01/15 1151    PT LONG TERM GOAL #1   Title pt will improve gait speed to 0.72m/s for normalized home mobilty with LRAD.    Baseline 0.66 without AD   Time 4   Period Weeks   Status Achieved   PT LONG TERM GOAL #2   Title pt will improve berg balance score by 6 pts to reduce risk of falls .   Time 4   Period Weeks   Status Achieved   PT LONG TERM GOAL #3   Title pt will reduce 5x sit to stand  time by 15 s demosntrating improved LE strength.    Baseline 28s with use of UEs   Time 4   Period Weeks   Status Achieved   PT LONG TERM GOAL #4   Title pt will score >19/24 on DGI to be less of a fall risk   Time 4   Period Weeks   Status New   PT LONG TERM GOAL #5   Title pt will score >52/56 on berg balance test to be a low fall risk   Time 4   Period Weeks   Status New   Additional Long Term Goals   Additional Long Term Goals Yes   PT LONG TERM GOAL #6   Title pt will ambulate >1.82m/s to be a normalized community ambulator    Time 4   Period Weeks   Status New   PT LONG TERM GOAL #7   Title pt will be able to walk safely over uneven surfaces x 220ft such as rock/grass/mulch to be safer for her work/leisure activities    Time 4   Period Weeks   Status New   PT LONG TERM GOAL #8   Title pt will be able to walk carrying 10lb item x 150 ft safely to be able to perform ADLs/ IADLs safely   Time 4   Period Weeks   Status New               Plan - 12/03/15 1550    Clinical Impression Statement pt did very well with progression of dynamic  balance activities and coordination. pt does show some coordination deficit on the RLE. pts performance improved with practice. she did need brief rest breaks   Rehab Potential Good   Clinical Impairments Affecting Rehab Potential memory loss from CVAs, hisotry of B TKA, Asthma   PT Frequency 2x / week   PT Duration 4 weeks   PT Treatment/Interventions Patient/family education;Neuromuscular re-education;Balance training;Therapeutic exercise;Therapeutic activities;Functional mobility training;Stair training;Gait training;Vestibular      Patient will benefit from skilled therapeutic intervention in order to improve the following deficits and impairments:  Decreased strength, Difficulty walking, Decreased range of motion, Decreased balance, Decreased endurance  Visit Diagnosis: Unsteadiness on feet     Problem List Patient Active Problem List   Diagnosis Date Noted  . Arthritis 09/24/2015  . Airway hyperreactivity 09/24/2015  . Benign hypertension 09/24/2015  . Chronic kidney disease (CKD), stage III (moderate) 09/24/2015  . Colitis 09/24/2015  . Esophagitis, reflux 09/24/2015  . H/O stroke without residual deficits 09/24/2015  . Decreased potassium in the blood 09/24/2015  . Cutaneous malignant melanoma ([place]) 09/24/2015  . Adiposity 09/24/2015  . Temporary cerebral vascular dysfunction 09/24/2015  . Hypercholesteremia 08/17/2015   Gorden Harms. Tamiko Leopard, PT, DPT [phone]   Maite Burlison 12/03/2015, 3:51 PM  [place] MAIN [facility] SERVICES [address] [place], [place], [address] Phone: [phone]   Fax:  [phone]  Name: Susan Campos MRN: [id number] Date of Birth: [birth year]

## 2015-12-08 ENCOUNTER — Ambulatory Visit: Payer: Medicare Other

## 2015-12-08 DIAGNOSIS — R2681 Unsteadiness on feet: Secondary | ICD-10-CM | POA: Diagnosis not present

## 2015-12-08 NOTE — Therapy (Signed)
[place] MAIN [facility] SERVICES [address] [place], [place], [address] Phone: [phone]   Fax:  [phone]  Physical Therapy Treatment  Patient Details  Name: Susan Campos MRN: [id number] Date of Birth: 24-Jan-1933 Referring Provider: Venia Minks  Encounter Date: 12/08/2015      PT End of Session - 12/08/15 1515    Visit Number 10   Number of Visits 15   Date for PT Re-Evaluation 12/17/15   Authorization Time Period 3/10   PT Start Time 1415   PT Stop Time 1500   PT Time Calculation (min) 45 min   Equipment Utilized During Treatment Gait belt   Activity Tolerance Patient tolerated treatment well   Behavior During Therapy [facility] for tasks assessed/performed      Past Medical History  Diagnosis Date  . GERD (gastroesophageal reflux disease)   . Hypercholesterolemia   . Cancer ([place])     skin  . Swelling of both lower extremities     feet and legs  . Hypertension   . Asthma     HX OF  . Arthritis     HIPS  . Vertigo     OCCASIONAL  . Orthopnea     sleeps in recliner, cannot lay flat on her back  . Stroke [facility])     CVA 2012/ MEMORY LOSS, 1999, 1 more in 2011    Past Surgical History  Procedure Laterality Date  . Knee arthroscopy    . Replacement total knee bilateral    . Uterine suspension    . Cataract extraction Right   . Cataract extraction w/phaco Left 05/27/2015    Procedure: CATARACT EXTRACTION PHACO AND INTRAOCULAR LENS PLACEMENT (IOC);  Surgeon: Leandrew Koyanagi, MD;  Location: [place];  Service: Ophthalmology;  Laterality: Left;  Marland Kitchen Mouth surgery    . Melanoma excision      from chest    There were no vitals filed for this visit.      Subjective Assessment - 12/08/15 1514    Subjective pt reports she is impressed with the improvement in her leg strength   Patient Stated Goals improve balance, walk outside with more confidence, walk carrying something   Currently in Pain? No/denies      gait training TM walking 0.79mph cues to inc gait speed step length and heel toe transfer x 5 min In hallway ball bounce 34ft x 4 cues for step length and speed. cGA for safety   therex Leg press 74lbs 3x10 each leg Agility ladder mutliple drills x 15 min pt needs rest breaks intermittantly due to SOB  pt requires CGA for safety on balance exercises                             PT Education - 12/08/15 1515    Education provided Yes   Education Details general info on senior center. gait training    Person(s) Educated Patient   Methods Explanation   Comprehension Verbalized understanding             PT Long Term Goals - 12/01/15 1151    PT LONG TERM GOAL #1   Title pt will improve gait speed to 0.41m/s for normalized home mobilty with LRAD.    Baseline 0.66 without AD   Time 4   Period Weeks   Status Achieved   PT LONG TERM GOAL #2   Title pt will improve berg balance score by  6 pts to reduce risk of falls .   Time 4   Period Weeks   Status Achieved   PT LONG TERM GOAL #3   Title pt will reduce 5x sit to stand time by 15 s demosntrating improved LE strength.    Baseline 28s with use of UEs   Time 4   Period Weeks   Status Achieved   PT LONG TERM GOAL #4   Title pt will score >19/24 on DGI to be less of a fall risk   Time 4   Period Weeks   Status New   PT LONG TERM GOAL #5   Title pt will score >52/56 on berg balance test to be a low fall risk   Time 4   Period Weeks   Status New   Additional Long Term Goals   Additional Long Term Goals Yes   PT LONG TERM GOAL #6   Title pt will ambulate >1.6m/s to be a normalized community ambulator    Time 4   Period Weeks   Status New   PT LONG TERM GOAL #7   Title pt will be able to walk safely over uneven surfaces x 289ft such as rock/grass/mulch to be safer for her work/leisure activities    Time 4   Period Weeks   Status New   PT LONG TERM GOAL #8   Title pt will be able to walk carrying  10lb item x 150 ft safely to be able to perform ADLs/ IADLs safely   Time 4   Period Weeks   Status New               Plan - 12/08/15 1515    Clinical Impression Statement pt continues to do well in therapy . she does have some trouble multitasking and keeping up her gait speed. pt does fairly well with cuing to maximize her gait speed and quality    Rehab Potential Good   Clinical Impairments Affecting Rehab Potential memory loss from CVAs, hisotry of B TKA, Asthma   PT Frequency 2x / week   PT Duration 4 weeks   PT Treatment/Interventions Patient/family education;Neuromuscular re-education;Balance training;Therapeutic exercise;Therapeutic activities;Functional mobility training;Stair training;Gait training;Vestibular      Patient will benefit from skilled therapeutic intervention in order to improve the following deficits and impairments:  Decreased strength, Difficulty walking, Decreased range of motion, Decreased balance, Decreased endurance  Visit Diagnosis: Unsteadiness on feet     Problem List Patient Active Problem List   Diagnosis Date Noted  . Arthritis 09/24/2015  . Airway hyperreactivity 09/24/2015  . Benign hypertension 09/24/2015  . Chronic kidney disease (CKD), stage III (moderate) 09/24/2015  . Colitis 09/24/2015  . Esophagitis, reflux 09/24/2015  . H/O stroke without residual deficits 09/24/2015  . Decreased potassium in the blood 09/24/2015  . Cutaneous malignant melanoma ([place]) 09/24/2015  . Adiposity 09/24/2015  . Temporary cerebral vascular dysfunction 09/24/2015  . Hypercholesteremia 08/17/2015   Gorden Harms. Dorothyann Mourer, PT, DPT [phone]  Kimora Stankovic 12/08/2015, 3:17 PM  [place] MAIN [facility] SERVICES [address] [place], [place], [address] Phone: [phone]   Fax:  [phone]  Name: Janith Francoeur MRN: [id number] Date of Birth: 1933-06-08

## 2015-12-10 ENCOUNTER — Ambulatory Visit: Payer: Medicare Other

## 2015-12-10 DIAGNOSIS — R2681 Unsteadiness on feet: Secondary | ICD-10-CM | POA: Diagnosis not present

## 2015-12-10 NOTE — Therapy (Signed)
[place] MAIN [facility] SERVICES [address] [place], [place], [address] Phone: [phone]   Fax:  [phone]  Physical Therapy Treatment  Patient Details  Name: Susan Campos MRN: [id number] Date of Birth: 1933/01/25 Referring Provider: Venia Minks  Encounter Date: 12/10/2015      PT End of Session - 12/10/15 1330    Visit Number 11   Number of Visits 15   Date for PT Re-Evaluation 12/17/15   Authorization Time Period 4/10   PT Start Time 1045   PT Stop Time 1130   PT Time Calculation (min) 45 min   Equipment Utilized During Treatment Gait belt   Activity Tolerance Patient tolerated treatment well   Behavior During Therapy [facility] for tasks assessed/performed      Past Medical History  Diagnosis Date  . GERD (gastroesophageal reflux disease)   . Hypercholesterolemia   . Cancer ([place])     skin  . Swelling of both lower extremities     feet and legs  . Hypertension   . Asthma     HX OF  . Arthritis     HIPS  . Vertigo     OCCASIONAL  . Orthopnea     sleeps in recliner, cannot lay flat on her back  . Stroke [facility])     CVA 2012/ MEMORY LOSS, 1999, 1 more in 2011    Past Surgical History  Procedure Laterality Date  . Knee arthroscopy    . Replacement total knee bilateral    . Uterine suspension    . Cataract extraction Right   . Cataract extraction w/phaco Left 05/27/2015    Procedure: CATARACT EXTRACTION PHACO AND INTRAOCULAR LENS PLACEMENT (IOC);  Surgeon: Leandrew Koyanagi, MD;  Location: [place];  Service: Ophthalmology;  Laterality: Left;  Marland Kitchen Mouth surgery    . Melanoma excision      from chest    There were no vitals filed for this visit.      Subjective Assessment - 12/10/15 1329    Subjective pt reports she is sad today because of the death of a pet   Patient Stated Goals improve balance, walk outside with more confidence, walk carrying something   Currently in Pain? No/denies         gait training: Overground out doors over varying surfaces including pavement, brick, pinestraw, grass Pt walked with and without SPC fwd/ retro / side stepping Pt needed intermittent rest breaks. SBA for safety most of the time. CGA over grass. She did have LOB walking retro over the grass requiring assist Sit to stand from bench no UE 5x3 16ft x 4 walking with ball bounce in hallway.                           PT Education - 12/10/15 1330    Education provided Yes   Education Details alternating gaze between ground and ahead over uneven surfaces   Person(s) Educated Patient   Methods Explanation   Comprehension Verbalized understanding             PT Long Term Goals - 12/01/15 1151    PT LONG TERM GOAL #1   Title pt will improve gait speed to 0.93m/s for normalized home mobilty with LRAD.    Baseline 0.66 without AD   Time 4   Period Weeks   Status Achieved   PT LONG TERM GOAL #2   Title pt will improve berg balance  score by 6 pts to reduce risk of falls .   Time 4   Period Weeks   Status Achieved   PT LONG TERM GOAL #3   Title pt will reduce 5x sit to stand time by 15 s demosntrating improved LE strength.    Baseline 28s with use of UEs   Time 4   Period Weeks   Status Achieved   PT LONG TERM GOAL #4   Title pt will score >19/24 on DGI to be less of a fall risk   Time 4   Period Weeks   Status New   PT LONG TERM GOAL #5   Title pt will score >52/56 on berg balance test to be a low fall risk   Time 4   Period Weeks   Status New   Additional Long Term Goals   Additional Long Term Goals Yes   PT LONG TERM GOAL #6   Title pt will ambulate >1.69m/s to be a normalized community ambulator    Time 4   Period Weeks   Status New   PT LONG TERM GOAL #7   Title pt will be able to walk safely over uneven surfaces x 230ft such as rock/grass/mulch to be safer for her work/leisure activities    Time 4   Period Weeks   Status New   PT LONG  TERM GOAL #8   Title pt will be able to walk carrying 10lb item x 150 ft safely to be able to perform ADLs/ IADLs safely   Time 4   Period Weeks   Status New               Plan - 12/10/15 1336    Clinical Impression Statement pt did relatively well over ground walking forward. she had more difficulty with fatigue, SOB, and walking backwards.    Rehab Potential Good   Clinical Impairments Affecting Rehab Potential memory loss from CVAs, hisotry of B TKA, Asthma   PT Frequency 2x / week   PT Duration 4 weeks   PT Treatment/Interventions Patient/family education;Neuromuscular re-education;Balance training;Therapeutic exercise;Therapeutic activities;Functional mobility training;Stair training;Gait training;Vestibular      Patient will benefit from skilled therapeutic intervention in order to improve the following deficits and impairments:  Decreased strength, Difficulty walking, Decreased range of motion, Decreased balance, Decreased endurance  Visit Diagnosis: Unsteadiness on feet     Problem List Patient Active Problem List   Diagnosis Date Noted  . Arthritis 09/24/2015  . Airway hyperreactivity 09/24/2015  . Benign hypertension 09/24/2015  . Chronic kidney disease (CKD), stage III (moderate) 09/24/2015  . Colitis 09/24/2015  . Esophagitis, reflux 09/24/2015  . H/O stroke without residual deficits 09/24/2015  . Decreased potassium in the blood 09/24/2015  . Cutaneous malignant melanoma ([place]) 09/24/2015  . Adiposity 09/24/2015  . Temporary cerebral vascular dysfunction 09/24/2015  . Hypercholesteremia 08/17/2015   Gorden Harms. Deyanira Fesler, PT, DPT [phone]  [name] 12/10/2015, 1:40 PM  [place] MAIN [facility] SERVICES [address] [place], [place], [address] Phone: [phone]   Fax:  [phone]  Name: Susan Campos MRN: [id number] Date of Birth: Sep 30, 1932

## 2015-12-15 ENCOUNTER — Ambulatory Visit: Payer: Medicare Other

## 2015-12-15 DIAGNOSIS — R2681 Unsteadiness on feet: Secondary | ICD-10-CM

## 2015-12-15 NOTE — Therapy (Signed)
[place] MAIN [facility] SERVICES [address] [place], [place], [address] Phone: [phone]   Fax:  [phone]  Physical Therapy Treatment/ DC note  Patient Details  Name: Susan Campos MRN: [id number] Date of Birth: [birth year] Referring Provider: Venia Minks  Encounter Date: 12/15/2015      PT End of Session - 12/15/15 1310    Visit Number 12   Number of Visits 15   Date for PT Re-Evaluation 12/17/15   Authorization Time Period 5/10   PT Start Time 1130   PT Stop Time 1200   PT Time Calculation (min) 30 min   Equipment Utilized During Treatment Gait belt   Activity Tolerance Patient tolerated treatment well   Behavior During Therapy [facility] for tasks assessed/performed      Past Medical History  Diagnosis Date  . GERD (gastroesophageal reflux disease)   . Hypercholesterolemia   . Cancer ([place])     skin  . Swelling of both lower extremities     feet and legs  . Hypertension   . Asthma     HX OF  . Arthritis     HIPS  . Vertigo     OCCASIONAL  . Orthopnea     sleeps in recliner, cannot lay flat on her back  . Stroke [facility])     CVA 2012/ MEMORY LOSS, 1999, 1 more in 2011    Past Surgical History  Procedure Laterality Date  . Knee arthroscopy    . Replacement total knee bilateral    . Uterine suspension    . Cataract extraction Right   . Cataract extraction w/phaco Left 05/27/2015    Procedure: CATARACT EXTRACTION PHACO AND INTRAOCULAR LENS PLACEMENT (IOC);  Surgeon: Leandrew Koyanagi, MD;  Location: [place];  Service: Ophthalmology;  Laterality: Left;  Marland Kitchen Mouth surgery    . Melanoma excision      from chest    There were no vitals filed for this visit.      Subjective Assessment - 12/15/15 1309    Subjective pt reports she feels so much more confident   Patient Stated Goals improve balance, walk outside with more confidence, walk carrying something   Currently in Pain? No/denies       therex:  PT reevaluated goals/outcome measures:     [facility] PT Assessment - 12/15/15 0001    Standardized Balance Assessment   Five times sit to stand comments  13s   10 Meter Walk 1.6ms   Berg Balance Test   Sit to Stand Able to stand without using hands and stabilize independently   Standing Unsupported Able to stand safely 2 minutes   Sitting with Back Unsupported but Feet Supported on Floor or Stool Able to sit safely and securely 2 minutes   Stand to Sit Sits safely with minimal use of hands   Transfers Able to transfer safely, minor use of hands   Standing Unsupported with Eyes Closed Able to stand 10 seconds safely   Standing Ubsupported with Feet Together Able to place feet together independently and stand 1 minute safely   From Standing, Reach Forward with Outstretched Arm Can reach confidently >25 cm (10")   From Standing Position, Pick up Object from Floor Able to pick up shoe safely and easily   From Standing Position, Turn to Look Behind Over each Shoulder Looks behind from both sides and weight shifts well   Turn 360 Degrees Able to turn 360 degrees safely in 4 seconds or less  Standing Unsupported, Alternately Place Feet on Step/Stool Able to stand independently and safely and complete 8 steps in 20 seconds   Standing Unsupported, One Foot in Front Able to plae foot ahead of the other independently and hold 30 seconds   Standing on One Leg Able to lift leg independently and hold equal to or more than 3 seconds   Total Score 53   Dynamic Gait Index   Level Surface Mild Impairment   Change in Gait Speed Mild Impairment   Gait with Horizontal Head Turns Mild Impairment   Gait with Vertical Head Turns Normal   Gait and Pivot Turn Normal   Step Over Obstacle Mild Impairment   Step Around Obstacles Normal   Steps Normal   Total Score 20                             PT Education - 12/15/15 1309    Education provided Yes   Education Details DC summary    Person(s) Educated Patient   Methods Explanation   Comprehension Verbalized understanding             PT Long Term Goals - 12/15/15 1311    PT LONG TERM GOAL #1   Title pt will improve gait speed to 0.36ms for normalized home mobilty with LRAD.    Baseline 0.66 without AD   Time 4   Period Weeks   Status Achieved   PT LONG TERM GOAL #2   Title pt will improve berg balance score by 6 pts to reduce risk of falls .   Time 4   Period Weeks   Status Achieved   PT LONG TERM GOAL #3   Title pt will reduce 5x sit to stand time by 15 s demosntrating improved LE strength.    Baseline 28s with use of UEs   Time 4   Period Weeks   Status Achieved   PT LONG TERM GOAL #4   Title pt will score >19/24 on DGI to be less of a fall risk   Time 4   Period Weeks   Status Achieved   PT LONG TERM GOAL #5   Title pt will score >52/56 on berg balance test to be a low fall risk   Time 4   Period Weeks   Status Achieved   PT LONG TERM GOAL #6   Title pt will ambulate >1.23m to be a normalized community ambulator    Time 4   Period Weeks   Status Partially Met   PT LONG TERM GOAL #7   Title pt will be able to walk safely over uneven surfaces x 20063fuch as rock/grass/mulch to be safer for her work/leisure activities    Time 4   Period Weeks   Status New   PT LONG TERM GOAL #8   Title pt will be able to walk carrying 10lb item x 150 ft safely to be able to perform ADLs/ IADLs safely   Baseline can carry 5lbs   Time 4   Period Weeks   Status Partially Met               Plan - 12/15/15 1310    Clinical Impression Statement pt has acheived most PT goals at this time and has returned to her PLOF. she is compliant with HeP and plans to join silver sneakers for continued exercise. pt will be DC after todays visit.    Rehab  Potential Good   Clinical Impairments Affecting Rehab Potential memory loss from CVAs, hisotry of B TKA, Asthma   PT Frequency 2x / week   PT Duration 4 weeks    PT Treatment/Interventions Patient/family education;Neuromuscular re-education;Balance training;Therapeutic exercise;Therapeutic activities;Functional mobility training;Stair training;Gait training;Vestibular      Patient will benefit from skilled therapeutic intervention in order to improve the following deficits and impairments:  Decreased strength, Difficulty walking, Decreased range of motion, Decreased balance, Decreased endurance  Visit Diagnosis: Unsteadiness on feet       G-Codes - Dec 16, 2015 1311    Functional Assessment Tool Used berg/DGI,12malk   Functional Limitation Mobility: Walking and moving around   Mobility: Walking and Moving Around Current Status [phone] At least 1 percent but less than 20 percent impaired, limited or restricted   Mobility: Walking and Moving Around Goal Status [phone] At least 1 percent but less than 20 percent impaired, limited or restricted   Mobility: Walking and Moving Around Discharge Status [phone] At least 1 percent but less than 20 percent impaired, limited or restricted      Problem List Patient Active Problem List   Diagnosis Date Noted  . Arthritis 09/24/2015  . Airway hyperreactivity 09/24/2015  . Benign hypertension 09/24/2015  . Chronic kidney disease (CKD), stage III (moderate) 09/24/2015  . Colitis 09/24/2015  . Esophagitis, reflux 09/24/2015  . H/O stroke without residual deficits 09/24/2015  . Decreased potassium in the blood 09/24/2015  . [place] 09/24/[place]  . Adiposity 09/24/2015  . Temporary cerebral vascular dysfunction 09/24/2015  . Hypercholesteremia 08/17/2015   AGorden Harms Korey Prashad, PT, DPT #[phone]  Taher Vannote [phone] 1:12 PM  [place] [facility] [address] [place] [address]Phone: [phone]  Fax:  [phone] Name: Susan MarterMRN: [id number] of Birth: 105/08/1932

## 2016-01-09 DIAGNOSIS — M8580 Other specified disorders of bone density and structure, unspecified site: Secondary | ICD-10-CM | POA: Insufficient documentation

## 2016-01-20 ENCOUNTER — Encounter: Payer: Self-pay | Admitting: Family Medicine

## 2016-01-20 ENCOUNTER — Ambulatory Visit (INDEPENDENT_AMBULATORY_CARE_PROVIDER_SITE_OTHER): Payer: Medicare Other | Admitting: Family Medicine

## 2016-01-20 ENCOUNTER — Other Ambulatory Visit: Payer: Self-pay | Admitting: Family Medicine

## 2016-01-20 VITALS — BP 140/66 | HR 76 | Temp 98.3°F | Resp 20 | Ht 63.0 in | Wt 196.0 lb

## 2016-01-20 DIAGNOSIS — E669 Obesity, unspecified: Secondary | ICD-10-CM | POA: Diagnosis not present

## 2016-01-20 DIAGNOSIS — Z8673 Personal history of transient ischemic attack (TIA), and cerebral infarction without residual deficits: Secondary | ICD-10-CM

## 2016-01-20 DIAGNOSIS — Z85820 Personal history of malignant melanoma of skin: Secondary | ICD-10-CM | POA: Diagnosis not present

## 2016-01-20 DIAGNOSIS — I10 Essential (primary) hypertension: Secondary | ICD-10-CM

## 2016-01-20 DIAGNOSIS — Z0000 Encounter for general adult medical examination without abnormal findings: Secondary | ICD-10-CM

## 2016-01-20 DIAGNOSIS — J4520 Mild intermittent asthma, uncomplicated: Secondary | ICD-10-CM | POA: Diagnosis not present

## 2016-01-20 DIAGNOSIS — E7800 Pure hypercholesterolemia, unspecified: Secondary | ICD-10-CM

## 2016-01-20 MED ORDER — ALBUTEROL SULFATE HFA 108 (90 BASE) MCG/ACT IN AERS: 2.0000 | INHALATION_SPRAY | Freq: Four times a day (QID) | RESPIRATORY_TRACT | Status: DC | PRN

## 2016-01-20 NOTE — Patient Instructions (Signed)
Please be sure to schedule a routine skin exam by your dermatologist. If you need a referral, please contact our office at your convenience.

## 2016-01-20 NOTE — Progress Notes (Signed)
Patient: Susan Campos, Female    DOB: 09-Apr-1933, 82 y.o.   MRN: [id number] Visit Date: 01/20/2016  Today's Provider: Lelon Huh, MD   Chief Complaint  Patient presents with  . Annual Exam  . Hypertension    follow up  . Hyperlipidemia    follow up   Subjective:    Annual physical  Susan Campos is a 82 y.o. female. She feels fairly well. She reports exercising 3 times a week. She reports she is sleeping fairly well.  -----------------------------------------------------------  Hypertension, follow-up:  BP Readings from Last 3 Encounters:  10/20/15 154/72  08/21/14 130/60  05/27/15 162/82    She was last seen for hypertension 3 months ago.  BP at that visit was 154/72. Management since that visit includes no changes. She reports good compliance with treatment. She is having side effects. (Dry mouth) She is exercising. She is adherent to low salt diet.   Outside blood pressures are not being checked. She is experiencing dyspnea.  Patient denies chest pain, chest pressure/discomfort, claudication, exertional chest pressure/discomfort, fatigue, irregular heart beat, lower extremity edema, near-syncope, orthopnea, palpitations, paroxysmal nocturnal dyspnea, syncope and tachypnea.   Cardiovascular risk factors include advanced age (older than 74 for men, 33 for women), dyslipidemia and hypertension.  Use of agents associated with hypertension: NSAIDS.     Weight trend: fluctuating a bit Wt Readings from Last 3 Encounters:  10/20/15 190 lb (86.183 kg)  08/21/14 181 lb (82.101 kg)  05/27/15 192 lb (87.091 kg)    Current diet: in general, a "healthy" diet    ------------------------------------------------------------------------   Lipid/Cholesterol, Follow-up:   Last seen for this 3 months ago.  Management changes since that visit include counseling patient to take cholesterol medication daily as prescribed. She had been off of  medications for a week prior to last cholesterol check due to illness.  . Last Lipid Panel:    Component Value Date/Time   CHOL 272* 10/20/2015 1152   CHOL 179 07/11/2014   TRIG 267* 10/20/2015 1152   HDL 37* 10/20/2015 1152   HDL 34* 07/11/2014   CHOLHDL 7.4* 10/20/2015 1152   LDLCALC 182* 10/20/2015 1152   LDLCALC 91 07/11/2014    Risk factors for vascular disease include hypercholesterolemia and hypertension  She reports good compliance with treatment. She is not having side effects.  Current symptoms include none and have been stable. Weight trend: fluctuating a bit Prior visit with dietician: no Current diet: in general, a "healthy" diet   Current exercise: cardiovascular workout on exercise equipment  Wt Readings from Last 3 Encounters:  10/20/15 190 lb (86.183 kg)  08/21/14 181 lb (82.101 kg)  05/27/15 192 lb (87.091 kg)    ------------------------------------------------------------------- Asthma:  Patient request a prescription for a Pro Air inhaler. She has not had an Asthma attack in 5-6 years but lately has experienced shortness of breath due to warm rainy weather.   Review of Systems  Constitutional: Negative for fever, chills, diaphoresis, activity change, appetite change, fatigue and unexpected weight change.  HENT: Negative for congestion, dental problem, drooling, ear discharge, ear pain, facial swelling, hearing loss, mouth sores, nosebleeds, postnasal drip, rhinorrhea, sinus pressure, sneezing, sore throat, tinnitus, trouble swallowing and voice change.   Eyes: Positive for itching. Negative for photophobia, pain, discharge, redness and visual disturbance.  Respiratory: Positive for shortness of breath. Negative for apnea, cough, choking, chest tightness, wheezing and stridor.   Cardiovascular: Negative for chest pain, palpitations and leg swelling.  Gastrointestinal: Negative for nausea, vomiting, abdominal pain, diarrhea, constipation, blood in stool,  abdominal distention, anal bleeding and rectal pain.  Endocrine: Negative for cold intolerance, heat intolerance, polydipsia, polyphagia and polyuria.  Genitourinary: Negative for dysuria, urgency, frequency, hematuria, flank pain, decreased urine volume, vaginal bleeding, vaginal discharge, enuresis, difficulty urinating, genital sores, vaginal pain, menstrual problem, pelvic pain and dyspareunia.  Musculoskeletal: Negative for myalgias, back pain, joint swelling, arthralgias, gait problem, neck pain and neck stiffness.  Skin: Negative for color change, pallor, rash and wound.  Allergic/Immunologic: Negative for environmental allergies, food allergies and immunocompromised state.  Neurological: Negative for dizziness, tremors, seizures, syncope, facial asymmetry, speech difficulty, weakness, light-headedness, numbness and headaches.  Hematological: Negative for adenopathy. Does not bruise/bleed easily.  Psychiatric/Behavioral: Negative for suicidal ideas, hallucinations, behavioral problems, confusion, sleep disturbance, self-injury, dysphoric mood, decreased concentration and agitation. The patient is not nervous/anxious and is not hyperactive.     Social History   Social History  . Marital Status: Married    Spouse Name: N/A  . Number of Children: N/A  . Years of Education: N/A   Occupational History  . Not on file.   Social History Main Topics  . Smoking status: Former Smoker -- 0.50 packs/day for 4 years    Types: Cigarettes  . Smokeless tobacco: Never Used  . Alcohol Use: No  . Drug Use: No  . Sexual Activity: Not on file   Other Topics Concern  . Not on file   Social History Narrative    Past Medical History  Diagnosis Date  . Cancer ([place])     skin  . Asthma     HX OF  . Arthritis     HIPS  . Vertigo     OCCASIONAL  . Orthopnea     sleeps in recliner, cannot lay flat on her back  . Stroke [facility])     CVA 2012/ MEMORY LOSS, 1999, 1 more in 2011     Patient  Active Problem List   Diagnosis Date Noted  . Osteopenia 01/09/2016  . Arthritis 09/24/2015  . Airway hyperreactivity 09/24/2015  . Benign hypertension 09/24/2015  . Chronic kidney disease (CKD), stage III (moderate) 09/24/2015  . Colitis 09/24/2015  . Esophagitis, reflux 09/24/2015  . H/O stroke without residual deficits 09/24/2015  . Decreased potassium in the blood 09/24/2015  . Cutaneous malignant melanoma ([place]) 09/24/2015  . Adiposity 09/24/2015  . Temporary cerebral vascular dysfunction 09/24/2015  . Hypercholesteremia 08/17/2015    Past Surgical History  Procedure Laterality Date  . Knee arthroscopy    . Replacement total knee bilateral    . Uterine suspension    . Cataract extraction Right   . Cataract extraction w/phaco Left 05/27/2015    Procedure: CATARACT EXTRACTION PHACO AND INTRAOCULAR LENS PLACEMENT (IOC);  Surgeon: Leandrew Koyanagi, MD;  Location: [place];  Service: Ophthalmology;  Laterality: Left;  Marland Kitchen Mouth surgery    . Melanoma excision      from chest    Her family history includes Heart attack in her father, mother, and paternal grandmother; Skin cancer in her paternal grandmother.    Current Meds  Medication Sig  . acetaminophen (TYLENOL ARTHRITIS PAIN) 650 MG CR tablet Take by mouth.  Marland Kitchen aspirin EC 81 MG tablet Take 81 mg by mouth daily. 9 am  . Multiple Vitamin (MULTIVITAMIN) capsule Take 1 capsule by mouth daily. 9 AM  . Sennosides-Docusate Sodium (SENNA) 8.6-50 MG TABS Take by mouth.  . simvastatin (ZOCOR) 5 MG tablet  Take 1 tablet (5 mg total) by mouth every evening.  . triamterene-hydrochlorothiazide (MAXZIDE) 75-50 MG tablet Take 1 tablet by mouth daily.    Patient Care Team: Margarita Rana, MD as PCP - General (Family Medicine)    Objective:   Vitals: BP 140/66 mmHg  Pulse 76  Temp(Src) 98.3 F (36.8 C) (Oral)  Resp 20  Ht 5\' 3"  (1.6 m)  Wt 196 lb (88.905 kg)  BMI 34.73 kg/m2  SpO2 96%  Physical Exam    General  Appearance:    Alert, cooperative, no distress, appears stated age  Head:    Normocephalic, without obvious abnormality, atraumatic  Eyes:    PERRL, conjunctiva/corneas clear, EOM's intact, fundi    benign, both eyes  Ears:    Normal TM's and external ear canals, both ears  Nose:   Nares normal, septum midline, mucosa normal, no drainage    or sinus tenderness  Throat:   Lips, mucosa, and tongue normal; teeth and gums normal  Neck:   Supple, symmetrical, trachea midline, no adenopathy;    thyroid:  no enlargement/tenderness/nodules; no carotid   bruit or JVD  Back:     Symmetric, no curvature, ROM normal, no CVA tenderness  Lungs:     Clear to auscultation bilaterally, respirations unlabored  Chest Wall:    No tenderness or deformity   Heart:    Regular rate and rhythm, S1 and S2 normal, no murmur, rub   or gallop  Breast Exam:    normal appearance, no masses or tenderness  Abdomen:     Soft, non-tender, bowel sounds active all four quadrants,    no masses, no organomegaly  Pelvic:    deferred  Extremities:   Extremities normal, atraumatic, no cyanosis or edema  Pulses:   2+ and symmetric all extremities  Skin:   Skin color, texture, turgor normal, Multiple pigmented lesions on back c/w seborrheic dermatitis  Lymph nodes:   Cervical, supraclavicular, and axillary nodes normal  Neurologic:   CNII-XII intact, normal strength, sensation and reflexes    throughout    Activities of Daily Living In your present state of health, do you have any difficulty performing the following activities: 01/20/2016 05/27/2015  Hearing? N N  Vision? N Y  Difficulty concentrating or making decisions? N N  Walking or climbing stairs? N N  Dressing or bathing? N N  Doing errands, shopping? N -    Fall Risk Assessment Fall Risk  01/20/2016 10/20/2015  Falls in the past year? Yes Yes  Number falls in past yr: 1 1  Injury with Fall? No No  Risk for fall due to : - Impaired balance/gait;History of  fall(s)  Follow up Falls evaluation completed -     Depression Screen PHQ 2/9 Scores 01/20/2016 10/20/2015  PHQ - 2 Score 0 1  PHQ- 9 Score - 5    Cognitive Testing - 6-CIT  Correct? Score   What year is it? yes 0 0 or 4  What month is it? yes 0 0 or 3  Memorize:    Pia Mau,  44,  [place],      What time is it? (within 1 hour) yes 0 0 or 3  Count backwards from 20 yes 0 0, 2, or 4  Name the months of the year yes 0 0, 2, or 4  Repeat name & address above yes 0 0, 2, 4, 6, 8, or 10       TOTAL SCORE  0/28   Interpretation:  Normal  Normal (0-7) Abnormal (8-28)      Audit-C Alcohol Use Screening  Question Answer Points  How often do you have alcoholic drink? never 0  On days you do drink alcohol, how many drinks do you typically consume? n/a 0  How oftey will you drink 6 or more in a total? never 0  Total Score:  0   A score of 3 or more in women, and 4 or more in men indicates increased risk for alcohol abuse, EXCEPT if all of the points are from question 1.   Current Exercise Habits: Structured exercise class, Type of exercise: treadmill;strength training/weights, Time (Minutes): 60, Frequency (Times/Week): 3, Weekly Exercise (Minutes/Week): 180, Intensity: Moderate Exercise limited by: None identified    Assessment & Plan:    Physical Reviewed patient's Family Medical History Reviewed and updated list of patient's medical providers Assessment of cognitive impairment was done Assessed patient's functional ability Established a written schedule for health screening [place] Completed and Reviewed  Exercise Activities and Dietary recommendations Goals    None      Immunization History  Administered Date(s) Administered  . Influenza-Unspecified 05/01/2014  . Pneumococcal Conjugate-13 07/09/2014  . Td 01/13/2005  . Zoster 01/09/2012    Health Maintenance  Topic Date Due  . Samul Dada  01/14/2015  . PNA vac Low  Risk Adult (2 of 2 - PPSV23) 07/10/2015  . INFLUENZA VACCINE  03/01/2016  . DEXA SCAN  Completed  . ZOSTAVAX  Completed      Discussed health benefits of physical activity, and encouraged her to engage in regular exercise appropriate for her age and condition.    ------------------------------------------------------------------------------------------------------------ 1. Annual physical exam Generally doing well.   2. Essential hypertension Well controlled.  Continue current medications.   - Renal Function Panel  3. Hyperlipidemia Is back on 5mg  simvastatin  4. Airway hyperreactivity, mild intermittent, uncomplicated Rare exacerbations. Normal pulmonary exam. Refill Proair today  6. H/O stroke without residual deficits Continue daily ASA. Has had carotid ultrasounds on multiple occasional which were unremarkable.   7. Obesity Diet and exercise.   8. History of malignant melanoma of skin Counseled on importance of annual skin checks by dermatology. She previously was followed by Dr. Sharlett Iles and is going to call to get established with new dermatologist. Advised her we can make a referral for her if necessary.     Lelon Huh, MD  [facility]

## 2016-01-21 LAB — RENAL FUNCTION PANEL
Albumin: 4.4 g/dL (ref 3.5–4.7)
BUN / CREAT RATIO: 20 (ref 12–28)
BUN: 22 mg/dL (ref 8–27)
CHLORIDE: 98 mmol/L (ref 96–106)
CO2: 27 mmol/L (ref 18–29)
Calcium: 10.2 mg/dL (ref 8.7–10.3)
Creatinine, Ser: 1.08 mg/dL — ABNORMAL HIGH (ref 0.57–1.00)
GFR calc non Af Amer: 49 mL/min/{1.73_m2} — ABNORMAL LOW (ref >59)
GFR, EST AFRICAN AMERICAN: 56 mL/min/{1.73_m2} — AB (ref >59)
GLUCOSE: 85 mg/dL (ref 65–99)
POTASSIUM: 3.5 mmol/L (ref 3.5–5.2)
Phosphorus: 2.6 mg/dL (ref 2.5–4.5)
SODIUM: 145 mmol/L — AB (ref 134–144)

## 2016-01-21 LAB — LIPID PANEL
CHOL/HDL RATIO: 6 ratio — AB (ref 0.0–4.4)
Cholesterol, Total: 210 mg/dL — ABNORMAL HIGH (ref 100–199)
HDL: 35 mg/dL — ABNORMAL LOW (ref >39)
LDL CALC: 119 mg/dL — AB (ref 0–99)
Triglycerides: 279 mg/dL — ABNORMAL HIGH (ref 0–149)
VLDL CHOLESTEROL CAL: 56 mg/dL — AB (ref 5–40)

## 2016-01-21 LAB — SPECIMEN STATUS REPORT

## 2016-01-26 ENCOUNTER — Telehealth: Payer: Self-pay | Admitting: Family Medicine

## 2016-01-26 DIAGNOSIS — E7800 Pure hypercholesterolemia, unspecified: Secondary | ICD-10-CM

## 2016-01-26 NOTE — Telephone Encounter (Signed)
Please review-aa 

## 2016-01-26 NOTE — Telephone Encounter (Signed)
Pt called to get lab results that where done on 01/20/16. Pt was advised that Dr. Caryn Section is out of the office this week. Can another provider look over the results? Please advise. Thanks TNP

## 2016-01-26 NOTE — Telephone Encounter (Signed)
Is note in chart already. Thanks.

## 2016-01-27 MED ORDER — SIMVASTATIN 10 MG PO TABS: 10.0000 mg | ORAL_TABLET | Freq: Every evening | ORAL | Status: DC

## 2016-01-27 NOTE — Telephone Encounter (Signed)
Advised pt and sent in new rx per note in chart. Renaldo Fiddler, CMA

## 2016-02-03 ENCOUNTER — Telehealth: Payer: Self-pay

## 2016-02-03 NOTE — Telephone Encounter (Signed)
LMTCB. sd  

## 2016-02-03 NOTE — Telephone Encounter (Signed)
-----   Message from Birdie Sons, MD sent at 01/21/2016  7:56 AM EDT ----- Cholesterol is better, but is still high at 210. To minimize risk of having another stroke, she needs to increase simvastatin to 10mg  daily, #90, rf x 3

## 2016-02-05 NOTE — Telephone Encounter (Signed)
Patient advised as below.  

## 2016-02-23 ENCOUNTER — Other Ambulatory Visit: Payer: Self-pay | Admitting: Family Medicine

## 2016-02-23 DIAGNOSIS — Z1231 Encounter for screening mammogram for malignant neoplasm of breast: Secondary | ICD-10-CM

## 2016-03-08 ENCOUNTER — Ambulatory Visit
Admission: RE | Admit: 2016-03-08 | Discharge: 2016-03-08 | Disposition: A | Discharge status: Home / Self Care | Payer: Medicare Other | Source: Ambulatory Visit | Attending: Family Medicine | Admitting: Family Medicine

## 2016-03-08 ENCOUNTER — Other Ambulatory Visit: Payer: Self-pay | Admitting: Family Medicine

## 2016-03-08 DIAGNOSIS — Z1231 Encounter for screening mammogram for malignant neoplasm of breast: Secondary | ICD-10-CM | POA: Insufficient documentation

## 2016-03-08 DIAGNOSIS — R928 Other abnormal and inconclusive findings on diagnostic imaging of breast: Secondary | ICD-10-CM | POA: Diagnosis not present

## 2016-03-09 ENCOUNTER — Other Ambulatory Visit: Payer: Self-pay | Admitting: Family Medicine

## 2016-03-09 DIAGNOSIS — N6489 Other specified disorders of breast: Secondary | ICD-10-CM

## 2016-03-16 NOTE — Telephone Encounter (Signed)
error 

## 2016-03-17 ENCOUNTER — Ambulatory Visit
Admission: RE | Admit: 2016-03-17 | Discharge: 2016-03-17 | Disposition: A | Discharge status: Home / Self Care | Payer: Medicare Other | Source: Ambulatory Visit | Attending: Family Medicine | Admitting: Family Medicine

## 2016-03-17 DIAGNOSIS — N6489 Other specified disorders of breast: Secondary | ICD-10-CM

## 2016-04-20 ENCOUNTER — Encounter: Payer: Self-pay | Admitting: Family Medicine

## 2016-04-20 ENCOUNTER — Ambulatory Visit (INDEPENDENT_AMBULATORY_CARE_PROVIDER_SITE_OTHER): Payer: Medicare Other | Admitting: Family Medicine

## 2016-04-20 VITALS — BP 132/60 | HR 91 | Temp 98.2°F | Resp 16 | Wt 196.0 lb

## 2016-04-20 DIAGNOSIS — I10 Essential (primary) hypertension: Secondary | ICD-10-CM

## 2016-04-20 DIAGNOSIS — M792 Neuralgia and neuritis, unspecified: Secondary | ICD-10-CM | POA: Diagnosis not present

## 2016-04-20 DIAGNOSIS — R270 Ataxia, unspecified: Secondary | ICD-10-CM | POA: Diagnosis not present

## 2016-04-20 MED ORDER — TRIAMTERENE-HCTZ 50-25 MG PO CAPS: 1.0000 | ORAL_CAPSULE | Freq: Every day | ORAL | 3 refills | Status: DC

## 2016-04-20 NOTE — Progress Notes (Signed)
Patient: Susan Campos Female    DOB: 1933/06/09   82 y.o.   MRN: [id number] Visit Date: 04/20/2016  Today's Provider: Lelon Huh, MD   Chief Complaint  Patient presents with  . Ear Pain   Subjective:    HPI Ear pain: Patient comes in complaining of intermittent left ear pain for the past 6 months. Patient describes the pain as a sharp bolt of lightning in her ear. The pain last only a couple seconds then resolves almost instantly. Usually occurs when in bed at night, but recently had a few episodes during day. Occurs 4-5 times a month.  No ringing or change in hearing.   She also report she would like to reduce diuretic due to having frequent urination. Swelling is well controlled.   Patient also reports sudden onset of difficulty walking and keeping balance normally since April. Requiring use of walker. Feels weak in legs. No falls. She is concerned she may have had stroke.     Allergies  Allergen Reactions  . Lovastatin Shortness Of Breath and Palpitations     Current Outpatient Prescriptions:  .  acetaminophen (TYLENOL ARTHRITIS PAIN) 650 MG CR tablet, Take by mouth., Disp: , Rfl:  .  albuterol (PROAIR HFA) 108 (90 Base) MCG/ACT inhaler, Inhale 2 puffs into the lungs every 6 (six) hours as needed for wheezing or shortness of breath., Disp: 18 g, Rfl: 1 .  aspirin EC 81 MG tablet, Take 81 mg by mouth daily. 9 am, Disp: , Rfl:  .  Multiple Vitamin (MULTIVITAMIN) capsule, Take 1 capsule by mouth daily. 9 AM, Disp: , Rfl:  .  simvastatin (ZOCOR) 10 MG tablet, Take 1 tablet (10 mg total) by mouth every evening., Disp: 90 tablet, Rfl: 3 .  triamterene-hydrochlorothiazide (MAXZIDE) 75-50 MG tablet, Take 1 tablet by mouth daily., Disp: 90 tablet, Rfl: 3  Review of Systems  Constitutional: Negative for appetite change, chills, fatigue and fever.  HENT: Positive for ear pain and sneezing. Negative for congestion, ear discharge and tinnitus.   Respiratory: Negative  for chest tightness and shortness of breath.   Cardiovascular: Negative for chest pain and palpitations.  Gastrointestinal: Negative for abdominal pain, nausea and vomiting.  Neurological: Negative for dizziness and weakness.    Social History  Substance Use Topics  . Smoking status: Former Smoker    Packs/day: 0.50    Years: 4.00    Types: Cigarettes  . Smokeless tobacco: Never Used  . Alcohol use No   Objective:   BP 132/60 (BP Location: Right Arm, Patient Position: Sitting, Cuff Size: Large)   Pulse 91   Temp 98.2 F (36.8 C) (Oral)   Resp 16   Wt 196 lb (88.9 kg)   SpO2 94% Comment: room air  BMI 34.72 kg/m   Physical Exam  General Appearance:    Alert, cooperative, no distress  HENT:   ENT exam normal, no neck nodes or sinus tenderness  Eyes:    PERRL, conjunctiva/corneas clear, EOM's intact       Lungs:     Clear to auscultation bilaterally, respirations unlabored  Heart:    Regular rate and rhythm  Neurologic:   Awake, alert, oriented x 3. Positive Rhomberg. Failed finger to nose test. +4 bilateral LE strength. No somatosensory deficits.           Assessment & Plan:     1. Neuralgia and neuritis Infrequent, intermittent symptoms. Discussed various pharmaceutical treatments such as carbamezipine. Will hold  off medications for now due to symptoms being very infrequent  2. Essential hypertension Reduce Dyazide to 37.5-12.5 due to polyuria.   3. Ataxia Sudden onset 5 months ago concerning for cerebellar CVA. Obtain MRI. Continue daily ECASA. She states she gets anxious having MRIs and requests mild sedative to take prior to test. Will call in diazepam.   Addressed multiple complicated chronic and acute medical problems today requiring extensive time in counseling and coordination care.  Over half of this 45 minute visit were spent in counseling and coordinating care of multiple medical problems.        Lelon Huh, MD  [facility]

## 2016-04-21 ENCOUNTER — Telehealth: Payer: Self-pay

## 2016-04-21 DIAGNOSIS — I10 Essential (primary) hypertension: Secondary | ICD-10-CM

## 2016-04-21 NOTE — Telephone Encounter (Signed)
Can they do a 37.5-25 or a 75-25?  Honestly if they can't do a 37.5-25 the patient may do better with a 37.5-12.5 for her age with a f/u to see how well she tolerates the decreased dose.

## 2016-04-21 NOTE — Telephone Encounter (Signed)
Karie Schwalbe with [facility] pharmacy said they are unable to get Triamterene-HCTZ 50/35 mg but they can get 37.5/75mg . Please advise.

## 2016-04-21 NOTE — Telephone Encounter (Signed)
Please advise 

## 2016-04-22 MED ORDER — TRIAMTERENE-HCTZ 37.5-25 MG PO CAPS: 1.0000 | ORAL_CAPSULE | Freq: Every day | ORAL | 5 refills | Status: DC

## 2016-04-22 NOTE — Telephone Encounter (Signed)
I would have her come in around mid-end October just for BP check to make sure it doesn't increase too much on her.

## 2016-04-22 NOTE — Telephone Encounter (Signed)
The pharmacy has the 37.5-25 but not 37.5-12.5.

## 2016-04-22 NOTE — Telephone Encounter (Signed)
Have sent Dyazide 37.5-25mg  to pharmacy

## 2016-04-22 NOTE — Telephone Encounter (Signed)
Patient next ov is 07/22/16. Should she come in sooner to f/u for med change?

## 2016-04-23 ENCOUNTER — Other Ambulatory Visit: Payer: Self-pay | Admitting: Family Medicine

## 2016-04-23 MED ORDER — DIAZEPAM 5 MG PO TABS: ORAL_TABLET | ORAL | 1 refills | Status: DC

## 2016-04-23 NOTE — Telephone Encounter (Signed)
Patient is being scheduled for MRI of brain. She needs prescription for diazepam to take prior to test. Please call in. Thanks.

## 2016-04-25 ENCOUNTER — Telehealth: Payer: Self-pay

## 2016-04-25 NOTE — Telephone Encounter (Signed)
Pharmacy notified.

## 2016-04-25 NOTE — Telephone Encounter (Signed)
Dr. Caryn Section, the pharmacy called back wanting to verify that the quantity ordered for Diazepam was supposed to be #30. She says that it seems usually to give that amount when a patient is only taking prior to a procedure. Please verify. Should it be quantity of #3? Thanks

## 2016-04-25 NOTE — Telephone Encounter (Signed)
Please have pharmacy change quantity of diazepam to #1 with no refills. Thanks.

## 2016-04-25 NOTE — Telephone Encounter (Signed)
Prescription phoned into pharmacy and patient advised.

## 2016-04-25 NOTE — Telephone Encounter (Signed)
Patient was notified.

## 2016-05-04 ENCOUNTER — Telehealth: Payer: Self-pay | Admitting: Family Medicine

## 2016-05-04 ENCOUNTER — Encounter: Payer: Self-pay | Admitting: Family Medicine

## 2016-05-04 DIAGNOSIS — I6990 Unspecified sequelae of unspecified cerebrovascular disease: Secondary | ICD-10-CM

## 2016-05-04 NOTE — Telephone Encounter (Signed)
Pt is requesting referral to see Dr Manuella Ghazi for memory changes.She would like him to see results of MRI

## 2016-05-04 NOTE — Telephone Encounter (Signed)
MRI of brain shows she has had several tiny strokes that are at least a few weeks, but they are in an area of her brain that is likely affecting her balance. To prevent further strokes she needs to get BP down and stay on simvastatin. Need to ADD lisinopril 5mg  daily #30 rf x 2 for her blood pressure. Continue triamterene, simvastatin, and 81mg  aspirin a day. Follow up in December as scheduled.

## 2016-05-05 DIAGNOSIS — I6990 Unspecified sequelae of unspecified cerebrovascular disease: Secondary | ICD-10-CM | POA: Insufficient documentation

## 2016-05-05 NOTE — Telephone Encounter (Signed)
Patient was advised of results per [facility].

## 2016-05-05 NOTE — Telephone Encounter (Signed)
OK to refer to Dr. Manuella Ghazi for follow up CVA and memory problems. Please send copy of MRI report done last week.

## 2016-05-05 NOTE — Telephone Encounter (Signed)
Patient was advised of MRI results. Patient was wanting to know if she should follow up with Dr. Manuella Ghazi? Please advise. Thanks!

## 2016-05-06 ENCOUNTER — Telehealth: Payer: Self-pay | Admitting: Family Medicine

## 2016-05-06 MED ORDER — LISINOPRIL 5 MG PO TABS: 5.0000 mg | ORAL_TABLET | Freq: Every day | ORAL | 2 refills | Status: DC

## 2016-05-25 DIAGNOSIS — I6381 Other cerebral infarction due to occlusion or stenosis of small artery: Secondary | ICD-10-CM | POA: Insufficient documentation

## 2016-05-25 DIAGNOSIS — E669 Obesity, unspecified: Secondary | ICD-10-CM | POA: Insufficient documentation

## 2016-05-25 DIAGNOSIS — G309 Alzheimer's disease, unspecified: Secondary | ICD-10-CM

## 2016-05-25 DIAGNOSIS — F0150 Vascular dementia without behavioral disturbance: Secondary | ICD-10-CM | POA: Insufficient documentation

## 2016-05-25 DIAGNOSIS — F0280 Dementia in other diseases classified elsewhere without behavioral disturbance: Secondary | ICD-10-CM | POA: Insufficient documentation

## 2016-06-07 DIAGNOSIS — Z8673 Personal history of transient ischemic attack (TIA), and cerebral infarction without residual deficits: Secondary | ICD-10-CM | POA: Diagnosis not present

## 2016-06-07 DIAGNOSIS — G458 Other transient cerebral ischemic attacks and related syndromes: Secondary | ICD-10-CM | POA: Diagnosis not present

## 2016-06-14 DIAGNOSIS — I493 Ventricular premature depolarization: Secondary | ICD-10-CM | POA: Diagnosis not present

## 2016-06-22 DIAGNOSIS — R42 Dizziness and giddiness: Secondary | ICD-10-CM | POA: Diagnosis not present

## 2016-06-22 DIAGNOSIS — R262 Difficulty in walking, not elsewhere classified: Secondary | ICD-10-CM | POA: Diagnosis not present

## 2016-06-22 DIAGNOSIS — G309 Alzheimer's disease, unspecified: Secondary | ICD-10-CM | POA: Diagnosis not present

## 2016-06-22 DIAGNOSIS — Z8673 Personal history of transient ischemic attack (TIA), and cerebral infarction without residual deficits: Secondary | ICD-10-CM | POA: Diagnosis not present

## 2016-06-29 ENCOUNTER — Encounter: Payer: Self-pay | Admitting: Physical Therapy

## 2016-06-29 ENCOUNTER — Ambulatory Visit: Payer: Medicare Other | Attending: Neurology | Admitting: Physical Therapy

## 2016-06-29 DIAGNOSIS — R279 Unspecified lack of coordination: Secondary | ICD-10-CM | POA: Diagnosis not present

## 2016-06-29 DIAGNOSIS — M6281 Muscle weakness (generalized): Secondary | ICD-10-CM | POA: Insufficient documentation

## 2016-06-29 DIAGNOSIS — R2681 Unsteadiness on feet: Secondary | ICD-10-CM | POA: Insufficient documentation

## 2016-06-29 NOTE — Therapy (Signed)
[place] MAIN [facility] SERVICES [address] [place], [place], [address] Phone: [phone]   Fax:  [phone]  Physical Therapy Evaluation  Patient Details  Name: Susan Campos MRN: [id number] Date of Birth: 1932-12-14 Referring Provider: Dr. Jennings Books  Encounter Date: 06/29/2016      PT End of Session - 06/29/16 1200    Visit Number 1   Number of Visits 12   Date for PT Re-Evaluation 08/10/16   Authorization Type gcode 1/10   PT Start Time 1015   PT Stop Time 1110   PT Time Calculation (min) 55 min   Activity Tolerance Patient tolerated treatment well   Behavior During Therapy [facility] for tasks assessed/performed      Past Medical History:  Diagnosis Date  . Arthritis    HIPS  . Asthma    HX OF  . Cancer ([place])    skin  . Orthopnea    sleeps in recliner, cannot lay flat on her back  . Stroke [facility])    CVA 2012/ MEMORY LOSS, 1999, 1 more in 2011  . Vertigo    OCCASIONAL    Past Surgical History:  Procedure Laterality Date  . CATARACT EXTRACTION Right   . CATARACT EXTRACTION W/PHACO Left 05/27/2015   Procedure: CATARACT EXTRACTION PHACO AND INTRAOCULAR LENS PLACEMENT (IOC);  Surgeon: Leandrew Koyanagi, MD;  Location: [place];  Service: Ophthalmology;  Laterality: Left;  . KNEE ARTHROSCOPY    . MELANOMA EXCISION     from chest  . MOUTH SURGERY    . REPLACEMENT TOTAL KNEE BILATERAL    . UTERINE SUSPENSION      There were no vitals filed for this visit.       Subjective Assessment - 06/29/16 1021    Subjective 82 yo Female reports being s/p CVA with right sided weakness at the end of February 2017. She received outpatient PT in April-May 2017 working on balance/gait. At that time she was independent in walking with good gait speed (1.0 m/s) Berg balance test was 53/56. She reports that over last few months having increased stiffness in RUE hand /wrist which limits her ability with quilting and  hygiene. She also reports decreased coordination in RLE. She reports having most difficulty with higher level balance on RLE. She also reports that her walking is slower. She reports that she had a recent MRI with multiple TIAs which could be affecting her mobility. She also reports having some memory loss.    Pertinent History personal factors affecting rehab: age, history of CVA, chronic condition, arthritis, CKD stage III, impaired memory   Limitations Walking;Other (comment)  balance, RUE stiffness, coordination;    Diagnostic tests MRI shows multiple old small lacunar infarcts;    Patient Stated Goals Improve balance and endurance; improve RUE joint mobility for fine motor tasks;             [facility] PT Assessment - 06/29/16 0001      Assessment   Medical Diagnosis s/p CVA, right sided weakness, impaired balance   Referring Provider Dr. Jennings Books   Onset Date/Surgical Date 09/29/15   Hand Dominance Right   Next MD Visit Feb 2018   Prior Therapy has had PT in April-May 2017 with good results; gait speed: 1.0 m/s, Berg Balance 53/56, DG!: 20     Precautions   Precautions Fall     Restrictions   Weight Bearing Restrictions No     Balance Screen   Has the  patient fallen in the past 6 months No  las fall was Dec 2016   Has the patient had a decrease in activity level because of a fear of falling?  Yes   Is the patient reluctant to leave their home because of a fear of falling?  No     Home Environment   Additional Comments Lives in 2 story home, bed and bath on first floor; doesn't go up/down stairs often;      Prior Function   Level of Independence Independent with transfers;Independent with gait     Cognition   Overall Cognitive Status Within Functional Limits for tasks assessed   Memory --  does have some memory loss;      Observation/Other Assessments   Quick DASH  34% (the lower the score the less disability with UE movement)     Sensation   Light Touch Appears  Intact   Additional Comments no numbness/tingling reported;     Coordination   Gross Motor Movements are Fluid and Coordinated Yes   Fine Motor Movements are Fluid and Coordinated No  has trouble with initial movement of RUE fingers   Finger Nose Finger Test accurate BUE   Heel Shin Test decreased coordination noted with RLE     Posture/Postural Control   Posture Comments erect posture in unsupported sitting;     AROM   Overall AROM Comments BUE and BLE AROM is WFL; decreased RUE wrist flexion with tightness noted in joint     Strength   Right Hand Grip (lbs) 55   Left Hand Grip (lbs) 45   Right Hip Flexion 3+/5   Right Hip ABduction 4-/5   Right Hip ADduction 4-/5   Left Hip Flexion 4+/5   Left Hip ABduction 4+/5   Left Hip ADduction 4+/5   Right Knee Flexion 4+/5   Right Knee Extension 4/5   Left Knee Flexion 5/5   Left Knee Extension 5/5   Right Ankle Dorsiflexion 4-/5   Left Ankle Dorsiflexion 5/5     Transfers   Comments able to transfer sit<>Stand without pushing on chair;      Ambulation/Gait   Gait Comments able to ambulate without AD, distant supervision, with increased shuffled steps, narrow base of support, slower gait speed;      Standardized Balance Assessment   Five times sit to stand comments  18.6 sec without HHA (more impaired from May 2017 which was 13 sec)   10 Meter Walk 0.833 m/s without AD, community ambulator; more impaired from May 2017 which was 1.0 m/s     Berg Balance Test   Sit to Stand Able to stand without using hands and stabilize independently   Standing Unsupported Able to stand safely 2 minutes   Sitting with Back Unsupported but Feet Supported on Floor or Stool Able to sit safely and securely 2 minutes   Stand to Sit Sits safely with minimal use of hands   Transfers Able to transfer safely, minor use of hands   Standing Unsupported with Eyes Closed Able to stand 10 seconds safely   Standing Ubsupported with Feet Together Able to  place feet together independently and stand 1 minute safely   From Standing, Reach Forward with Outstretched Arm Can reach confidently >25 cm (10")   From Standing Position, Pick up Object from Floor Able to pick up shoe, needs supervision   From Standing Position, Turn to Look Behind Over each Shoulder Looks behind one side only/other side shows less weight shift  Turn 360 Degrees Able to turn 360 degrees safely one side only in 4 seconds or less   Standing Unsupported, Alternately Place Feet on Step/Stool Able to stand independently and safely and complete 8 steps in 20 seconds   Standing Unsupported, One Foot in [place] to take small step independently and hold 30 seconds   Standing on One Leg Tries to lift leg/unable to hold 3 seconds but remains standing independently   Total Score 48   Berg comment: 50 % risk for falls; more impaired from May 2017 which was 53/56                           PT Education - 06/29/16 1200    Education provided Yes   Education Details recommendations, plan of care;    Person(s) Educated Patient   Methods Explanation   Comprehension Verbalized understanding             PT Long Term Goals - 06/29/16 1206      PT LONG TERM GOAL #1   Title Patient will be independent in home exercise program to improve strength/mobility for better functional independence with ADLs.   Time 6   Period Weeks   Status New     PT LONG TERM GOAL #2   Title pt will improve berg balance score by 6 pts to reduce risk of falls .   Time 6   Period Weeks   Status New     PT LONG TERM GOAL #3   Title pt will reduce 5x sit to stand time to < 15 s demosntrating improved LE strength.    Time 6   Period Weeks   Status New     PT LONG TERM GOAL #4   Title Patient will increase BUE and BLE gross strength to 4+/5 as to improve functional strength for independent gait, increased standing tolerance and increased ADL ability.   Time 6   Period Weeks    Status New     PT LONG TERM GOAL #5   Title  Patient will decrease Quick DASH score by > 8 points demonstrating reduced self-reported upper extremity disability.   Time 6   Period Weeks   Status New               Plan - 06/29/16 1201    Clinical Impression Statement 82 yo Female reports increased fatigue and trouble with her balance over last few months. She is s/p CVA with right sided weakness in Feb 2017. Patient did receive outpatient PT services from April-May 2017 with significant improvement. However when assessing gait, transfer ability and balance patient tested worse today as compared to previous bout of therapy. She also demonstrates increased RUE/LE weakness. She reports difficulty performing some self care ADLs with RUE due to weakness and stiffness in wrist. She would benefit from additional skilled PT intervention to improve balance/strength and return to PLOF.    Rehab Potential Good   Clinical Impairments Affecting Rehab Potential positive indicator: good PLOF, good response from prior therapy; negative: chronic condition, co-morbidities; Patient's clinical presentation is evolving as she is experiencing an exacerbation of impaired balance and weakness;    PT Frequency 2x / week   PT Duration 6 weeks   PT Treatment/Interventions Cryotherapy;Electrical Stimulation;Gait training;Moist Heat;Stair training;Functional mobility training;Therapeutic activities;Therapeutic exercise;Balance training;Neuromuscular re-education;Manual techniques;Patient/family education   PT Next Visit Plan initiate HEP, balance, right sided strengthening   PT Home Exercise Plan will  address next visit;    Consulted and Agree with Plan of Care Patient      Patient will benefit from skilled therapeutic intervention in order to improve the following deficits and impairments:  Abnormal gait, Decreased mobility, Decreased coordination, Decreased activity tolerance, Decreased endurance, Decreased  strength, Impaired flexibility, Difficulty walking, Decreased safety awareness, Decreased balance  Visit Diagnosis: Unsteadiness on feet - Plan: PT plan of care cert/re-cert  Muscle weakness (generalized) - Plan: PT plan of care cert/re-cert  Unspecified lack of coordination - Plan: PT plan of care cert/re-cert      G-Codes - [id number] Nov 23, 1207    Functional Assessment Tool Used clinical judgement, 10 meter walk, 5 times sit<>Stand, Berg Balance Assessment   Functional Limitation Mobility: Walking and moving around   Mobility: Walking and Moving Around Current Status [phone]) At least 20 percent but less than 40 percent impaired, limited or restricted   Mobility: Walking and Moving Around Goal Status [phone]) At least 1 percent but less than 20 percent impaired, limited or restricted       Problem List Patient Active Problem List   Diagnosis Date Noted  . Late effects of CVA (cerebrovascular accident) 05/05/2016  . Ataxia 04/20/2016  . Osteopenia 01/09/2016  . Arthritis 09/24/2015  . Airway hyperreactivity 09/24/2015  . Benign hypertension 09/24/2015  . Chronic kidney disease (CKD), stage III (moderate) 09/24/2015  . Colitis 09/24/2015  . Esophagitis, reflux 09/24/2015  . History of cardioembolic cerebrovascular accident (CVA) 09/24/2015  . Decreased potassium in the blood 09/24/2015  . History of malignant melanoma of skin 09/24/2015  . Obesity 09/24/2015  . Temporary cerebral vascular dysfunction 09/24/2015  . Hypercholesteremia 08/17/2015    Ashika Apuzzo PT, DPT 06/29/2016, 12:11 PM  [place] MAIN [facility] SERVICES [address] [place], [place], [address] Phone: [phone]   Fax:  [phone]  Name: Susan Campos MRN: [id number] Date of Birth: 14-Aug-1932

## 2016-07-04 ENCOUNTER — Ambulatory Visit: Payer: Medicare Other | Attending: Neurology | Admitting: Physical Therapy

## 2016-07-04 ENCOUNTER — Encounter: Payer: Self-pay | Admitting: Physical Therapy

## 2016-07-04 DIAGNOSIS — R2681 Unsteadiness on feet: Secondary | ICD-10-CM | POA: Insufficient documentation

## 2016-07-04 DIAGNOSIS — R279 Unspecified lack of coordination: Secondary | ICD-10-CM | POA: Diagnosis not present

## 2016-07-04 DIAGNOSIS — M6281 Muscle weakness (generalized): Secondary | ICD-10-CM | POA: Diagnosis not present

## 2016-07-04 NOTE — Patient Instructions (Signed)
  ABDUCTION: Sitting - Exercise Ball: Resistance Band (Active)   Sit with feet flat. With band tied around both legs, Lift right leg slightly and, against resistance band, draw it out to side. Complete __2_ sets of __10_ repetitions. Perform _2__ sessions per day.  Copyright  VHI. All rights reserved.  FLEXION: Sitting - Resistance Band (Active)   Sit, both feet flat. Have band tied around both legs above knees, lift right knee toward ceiling.Repeat with other knee Complete _2__ sets of _10__ repetitions. Perform _2__ sessions per day.  http://gtsc.exer.us/21   Copyright  VHI. All rights reserved.  HIP / KNEE: Extension - Sit to Stand   Sitting, lean chest forward, raise hips up from surface. Straighten hips and knees. Weight bear equally on left and right sides. Backs of legs should not push off surface. __10_ reps per set, __2_ sets per day, _5__ days per week Use assistive device as needed.  Copyright  VHI. All rights reserved.  FLEXION: Sitting - Resistance Band (Active)   Sit with right foot flat. Have band tied around both feet, bend ankle, bringing toes toward head. Complete __2_ sets of __10_ repetitions. Perform _2__ sessions per day.  Balance, Proprioception: Hip Abduction With Tubing   With tubing attached to both ankles, Standing holding onto counter, kick one leg out to side and then Return.  Repeat _10___ times  On each side.  Do ___2_ sessions per day.  http://cc.exer.us/20    Copyright  VHI. All rights reserved.  Band Walk: Side Stepping   Tie band around legs, AROUND ANKLES. Step _10__ feet to one side, then step back to start. Repeat _2-3__ feet per session. Note: Small towel between band and skin eases rubbing.  http://plyo.exer.us/76   Copyright  VHI. All rights reserved.

## 2016-07-04 NOTE — Therapy (Signed)
[place] MAIN [facility] SERVICES [address] [place], [place], [address] Phone: [phone]   Fax:  [phone]  Physical Therapy Treatment  Patient Details  Name: Susan Campos MRN: [id number] Date of Birth: Oct 30, 1932 Referring Provider: Dr. Jennings Books  Encounter Date: 07/04/2016      PT End of Session - 07/04/16 1029    Visit Number 2   Number of Visits 12   Date for PT Re-Evaluation 08/10/16   Authorization Type gcode 2/10   PT Start Time 1022   PT Stop Time 1100   PT Time Calculation (min) 38 min   Activity Tolerance Patient tolerated treatment well   Behavior During Therapy [facility] for tasks assessed/performed      Past Medical History:  Diagnosis Date  . Arthritis    HIPS  . Asthma    HX OF  . Cancer ([place])    skin  . Orthopnea    sleeps in recliner, cannot lay flat on her back  . Stroke [facility])    CVA 2012/ MEMORY LOSS, 1999, 1 more in 2011  . Vertigo    OCCASIONAL    Past Surgical History:  Procedure Laterality Date  . CATARACT EXTRACTION Right   . CATARACT EXTRACTION W/PHACO Left 05/27/2015   Procedure: CATARACT EXTRACTION PHACO AND INTRAOCULAR LENS PLACEMENT (IOC);  Surgeon: Leandrew Koyanagi, MD;  Location: [place];  Service: Ophthalmology;  Laterality: Left;  . KNEE ARTHROSCOPY    . MELANOMA EXCISION     from chest  . MOUTH SURGERY    . REPLACEMENT TOTAL KNEE BILATERAL    . UTERINE SUSPENSION      There were no vitals filed for this visit.      Subjective Assessment - 07/04/16 1028    Subjective patient reports doing okay this monring. She reports, "Could I have gotten worse from my new TIAs?" She reports that she has been more tired with trying to feed her animals. Patient presents to therapy without her SPC; "I just forgot it and I don't want to fall."    Pertinent History personal factors affecting rehab: age, history of CVA, chronic condition, arthritis, CKD stage III, impaired  memory   Limitations Walking;Other (comment)  balance, RUE stiffness, coordination;    Diagnostic tests MRI shows multiple old small lacunar infarcts;    Patient Stated Goals Improve balance and endurance; improve RUE joint mobility for fine motor tasks;    Currently in Pain? No/denies       TREATMENT: Warm up on Nustep BUE/BLE level 2 x4 min (unbilled);  PT initiated HEP: Seated with red tband around both legs: Hip flexion march x15 bilaterally; Min VCs to increase ROM for better strengthening;  Hip abduction BLE x15; Ankle DF x15 bilaterally; Required min Vcs for correct positioning of red tband;   Standing red tband hip abduction x15  Bilaterally; Side stepping with red tband 10 feet x3 laps each direction;  Sit<>stand from regular chair x10 reps with arms across chest; min VCs to increase forward lean;   Patient required min-moderate verbal/tactile cues for correct exercise technique. PT provided written handout for HEP- see patient instructions;  Leg press: BLE plate 75# [id number] with min VCs for correct positioning and to slow down LE movement coming back;  RLE plate #[id number] with min Vcs to slow down eccentric return for better strengthening;  PT Education - 07/04/16 1029    Education provided Yes   Education Details strengthening, HEP   Person(s) Educated Patient   Methods Explanation;Verbal cues;Handout   Comprehension Verbalized understanding;Returned demonstration;Verbal cues required             PT Long Term Goals - 06/29/16 1206      PT LONG TERM GOAL #1   Title Patient will be independent in home exercise program to improve strength/mobility for better functional independence with ADLs.   Time 6   Period Weeks   Status New     PT LONG TERM GOAL #2   Title pt will improve berg balance score by 6 pts to reduce risk of falls .   Time 6   Period Weeks   Status New     PT LONG TERM GOAL #3   Title pt will  reduce 5x sit to stand time to < 15 s demosntrating improved LE strength.    Time 6   Period Weeks   Status New     PT LONG TERM GOAL #4   Title Patient will increase BUE and BLE gross strength to 4+/5 as to improve functional strength for independent gait, increased standing tolerance and increased ADL ability.   Time 6   Period Weeks   Status New     PT LONG TERM GOAL #5   Title  Patient will decrease Quick DASH score by > 8 points demonstrating reduced self-reported upper extremity disability.   Time 6   Period Weeks   Status New               Plan - 07/04/16 1102    Clinical Impression Statement Initiated HEP with instruction of LE strengthening exercise. Patient required mod VCs for correct technique and positioning. She reports increased weakness and difficulty on right side. Patient would benefit from additional skilled PT intervention to improve balance/gait safety and return to PLOF.    Rehab Potential Good   Clinical Impairments Affecting Rehab Potential positive indicator: good PLOF, good response from prior therapy; negative: chronic condition, co-morbidities; Patient's clinical presentation is evolving as she is experiencing an exacerbation of impaired balance and weakness;    PT Frequency 2x / week   PT Duration 6 weeks   PT Treatment/Interventions Cryotherapy;Electrical Stimulation;Gait training;Moist Heat;Stair training;Functional mobility training;Therapeutic activities;Therapeutic exercise;Balance training;Neuromuscular re-education;Manual techniques;Patient/family education   PT Next Visit Plan initiate HEP, balance, right sided strengthening   PT Home Exercise Plan will address next visit;    Consulted and Agree with Plan of Care Patient      Patient will benefit from skilled therapeutic intervention in order to improve the following deficits and impairments:  Abnormal gait, Decreased mobility, Decreased coordination, Decreased activity tolerance, Decreased  endurance, Decreased strength, Impaired flexibility, Difficulty walking, Decreased safety awareness, Decreased balance  Visit Diagnosis: Unsteadiness on feet  Muscle weakness (generalized)  Unspecified lack of coordination     Problem List Patient Active Problem List   Diagnosis Date Noted  . Late effects of CVA (cerebrovascular accident) 05/05/2016  . Ataxia 04/20/2016  . Osteopenia 01/09/2016  . Arthritis 09/24/2015  . Airway hyperreactivity 09/24/2015  . Benign hypertension 09/24/2015  . Chronic kidney disease (CKD), stage III (moderate) 09/24/2015  . Colitis 09/24/2015  . Esophagitis, reflux 09/24/2015  . History of cardioembolic cerebrovascular accident (CVA) 09/24/2015  . Decreased potassium in the blood 09/24/2015  . History of malignant melanoma of skin 09/24/2015  . Obesity 09/24/2015  . Temporary cerebral vascular dysfunction  09/24/2015  . Hypercholesteremia 08/17/2015    Trotter,Margaret PT, DPT 07/04/2016, 11:03 AM  [place] MAIN [facility] SERVICES [address] [place], [place], [address] Phone: [phone]   Fax:  [phone]  Name: Susan Campos MRN: [id number] Date of Birth: [birth year]

## 2016-07-06 ENCOUNTER — Encounter: Payer: Self-pay | Admitting: Physical Therapy

## 2016-07-06 ENCOUNTER — Ambulatory Visit: Payer: Medicare Other | Admitting: Physical Therapy

## 2016-07-06 DIAGNOSIS — M6281 Muscle weakness (generalized): Secondary | ICD-10-CM | POA: Diagnosis not present

## 2016-07-06 DIAGNOSIS — R2681 Unsteadiness on feet: Secondary | ICD-10-CM | POA: Diagnosis not present

## 2016-07-06 DIAGNOSIS — R279 Unspecified lack of coordination: Secondary | ICD-10-CM

## 2016-07-06 NOTE — Therapy (Signed)
[place] MAIN [facility] SERVICES [address] [place], [place], [address] Phone: [phone]   Fax:  [phone]  Physical Therapy Treatment  Patient Details  Name: Susan Campos MRN: [id number] Date of Birth: 07-20-1933 Referring Provider: Dr. Jennings Books  Encounter Date: 07/06/2016      PT End of Session - 07/06/16 1054    Visit Number 3   Number of Visits 12   Date for PT Re-Evaluation 08/10/16   Authorization Type gcode 3/10   PT Start Time 1010   PT Stop Time 1055   PT Time Calculation (min) 45 min   Equipment Utilized During Treatment Gait belt   Activity Tolerance Patient tolerated treatment well   Behavior During Therapy [facility] for tasks assessed/performed      Past Medical History:  Diagnosis Date  . Arthritis    HIPS  . Asthma    HX OF  . Cancer ([place])    skin  . Orthopnea    sleeps in recliner, cannot lay flat on her back  . Stroke [facility])    CVA 2012/ MEMORY LOSS, 1999, 1 more in 2011  . Vertigo    OCCASIONAL    Past Surgical History:  Procedure Laterality Date  . CATARACT EXTRACTION Right   . CATARACT EXTRACTION W/PHACO Left 05/27/2015   Procedure: CATARACT EXTRACTION PHACO AND INTRAOCULAR LENS PLACEMENT (IOC);  Surgeon: Leandrew Koyanagi, MD;  Location: [place];  Service: Ophthalmology;  Laterality: Left;  . KNEE ARTHROSCOPY    . MELANOMA EXCISION     from chest  . MOUTH SURGERY    . REPLACEMENT TOTAL KNEE BILATERAL    . UTERINE SUSPENSION      There were no vitals filed for this visit.      Subjective Assessment - 07/06/16 1017    Subjective Patient reports being a little sore in her right hip after last session; "I feel like I had a good work out"    Pertinent History personal factors affecting rehab: age, history of CVA, chronic condition, arthritis, CKD stage III, impaired memory   Limitations Walking;Other (comment)  balance, RUE stiffness, coordination;    Diagnostic tests MRI  shows multiple old small lacunar infarcts;    Patient Stated Goals Improve balance and endurance; improve RUE joint mobility for fine motor tasks;    Currently in Pain? No/denies       TREATMENT: PT instructed patient in RUE wrist stretch and ROM exercise: Wrist flexion/extension stretch 15 sec hold x3 each; Wrist circles clockwise/counterclockwise 1# weight 2x10 each; Wrist pronation/supination 1# x10 each; Wrist flexion/extension 1# x10 each;  Patient required min-moderate verbal/tactile cues for correct exercise technique. Required cues to avoid full UE movement but to isolate RUE wrist movement. Also required cues to increase hold time with stretch for better mobility;  Resisted weighted gait 12.5# forward/backward, side/side x4 way x2 laps each with min-mod A for balance and mod VCs to slow down eccentric return and improve weight shift for better balance control;                            PT Education - 07/06/16 1054    Education provided Yes   Education Details RUE ROM exercise, HEP   Person(s) Educated Patient   Methods Explanation;Verbal cues;Handout   Comprehension Verbalized understanding;Returned demonstration;Verbal cues required             PT Long Term Goals - 06/29/16 1206  PT LONG TERM GOAL #1   Title Patient will be independent in home exercise program to improve strength/mobility for better functional independence with ADLs.   Time 6   Period Weeks   Status New     PT LONG TERM GOAL #2   Title pt will improve berg balance score by 6 pts to reduce risk of falls .   Time 6   Period Weeks   Status New     PT LONG TERM GOAL #3   Title pt will reduce 5x sit to stand time to < 15 s demosntrating improved LE strength.    Time 6   Period Weeks   Status New     PT LONG TERM GOAL #4   Title Patient will increase BUE and BLE gross strength to 4+/5 as to improve functional strength for independent gait, increased standing  tolerance and increased ADL ability.   Time 6   Period Weeks   Status New     PT LONG TERM GOAL #5   Title  Patient will decrease Quick DASH score by > 8 points demonstrating reduced self-reported upper extremity disability.   Time 6   Period Weeks   Status New               Plan - 07/06/16 1055    Clinical Impression Statement Advanced HEP with instruction in RUE wrist movement and strengthening exercise. She required mod VCs for correct positioning to improve wrist ROM. Patient also instructed in advanced balance/strengthening exercise. She required increased assistance with dynamic resisted gait. Patient reports increased fatigue at end of treatment session; She would benefit from additional skilled PT intervention to improve balance/gait safety and reduce fall risk;    Rehab Potential Good   Clinical Impairments Affecting Rehab Potential positive indicator: good PLOF, good response from prior therapy; negative: chronic condition, co-morbidities; Patient's clinical presentation is evolving as she is experiencing an exacerbation of impaired balance and weakness;    PT Frequency 2x / week   PT Duration 6 weeks   PT Treatment/Interventions Cryotherapy;Electrical Stimulation;Gait training;Moist Heat;Stair training;Functional mobility training;Therapeutic activities;Therapeutic exercise;Balance training;Neuromuscular re-education;Manual techniques;Patient/family education   PT Next Visit Plan initiate HEP, balance, right sided strengthening   PT Home Exercise Plan advanced, see patient instructions;    Consulted and Agree with Plan of Care Patient      Patient will benefit from skilled therapeutic intervention in order to improve the following deficits and impairments:  Abnormal gait, Decreased mobility, Decreased coordination, Decreased activity tolerance, Decreased endurance, Decreased strength, Impaired flexibility, Difficulty walking, Decreased safety awareness, Decreased  balance  Visit Diagnosis: Unsteadiness on feet  Muscle weakness (generalized)  Unspecified lack of coordination     Problem List Patient Active Problem List   Diagnosis Date Noted  . Late effects of CVA (cerebrovascular accident) 05/05/2016  . Ataxia 04/20/2016  . Osteopenia 01/09/2016  . Arthritis 09/24/2015  . Airway hyperreactivity 09/24/2015  . Benign hypertension 09/24/2015  . Chronic kidney disease (CKD), stage III (moderate) 09/24/2015  . Colitis 09/24/2015  . Esophagitis, reflux 09/24/2015  . History of cardioembolic cerebrovascular accident (CVA) 09/24/2015  . Decreased potassium in the blood 09/24/2015  . History of malignant melanoma of skin 09/24/2015  . Obesity 09/24/2015  . Temporary cerebral vascular dysfunction 09/24/2015  . Hypercholesteremia 08/17/2015    Trotter,Margaret PT, DPT 07/06/2016, 10:57 AM  [place] MAIN [facility] SERVICES [address] [place], [place], [address] Phone: [phone]   Fax:  [phone]  Name: Meriel Lambing MRN: [id number] Date of Birth: [birth year]

## 2016-07-06 NOTE — Patient Instructions (Addendum)
Flexion / Extension    Hands clasped, bend wrist by moving hands to left and right. Repeat _10___ times. Do _2___ sessions per day.  Copyright  VHI. All rights reserved.  Extension: Stretch - Forearm Extensors (Sitting)    Position Helper: Support left arm at elbow. Motion -Helper bends wrist down gently and folds fingers into fist. -Then straighten elbow fully. CAUTION: Do not force movement if painful. Hold __2_ seconds. Repeat _10__ times. Repeat with other arm. Do __2_ sessions per day. Variation: Bend wrist down and in direction of little finger during motion.   Copyright  VHI. All rights reserved.  Extension: Stretch - Flexors    Position Helper: Support left arm near wrist. Place palm against fingers. Motion -Helper applies steady pressure to straighten fingers fully. -Bend wrist back gently. CAUTION: Stop at point of tension in muscle or joint. Hold _2__ seconds. Repeat _10__ times. Repeat with other hand. Do _2__ sessions per day. Variation: Perform with gentle pressure on top of finger joints for added control.  Copyright  VHI. All rights reserved.  Wrist Circles    Moving at wrist only, circle right hands clockwise, then counterclockwise, with palm closed. Repeat _10___ times each direction per session. Do _2___ sessions per week. Hand Variation: Palm open  Copyright  VHI. All rights reserved.  Wrist Flexion: Resisted    With right palm up, ___1_ pound weight in hand, bend wrist up. Return slowly. Repeat _10___ times per set. Do ___2_ sets per session. Do __2__ sessions per day.  Copyright  VHI. All rights reserved.  Forearm Supination / Pronation: Resisted    With _1___ pound object in right hand, slowly turn palm up, then down. Repeat _10___ times per set. Do __2__ sets per session. Do __2__ sessions per day.  Copyright  VHI. All rights reserved.

## 2016-07-11 ENCOUNTER — Ambulatory Visit: Payer: Medicare Other | Admitting: Physical Therapy

## 2016-07-11 ENCOUNTER — Encounter: Payer: Self-pay | Admitting: Physical Therapy

## 2016-07-11 DIAGNOSIS — R279 Unspecified lack of coordination: Secondary | ICD-10-CM | POA: Diagnosis not present

## 2016-07-11 DIAGNOSIS — M6281 Muscle weakness (generalized): Secondary | ICD-10-CM

## 2016-07-11 DIAGNOSIS — R2681 Unsteadiness on feet: Secondary | ICD-10-CM | POA: Diagnosis not present

## 2016-07-11 NOTE — Therapy (Signed)
[place] MAIN [facility] SERVICES [address] [place], [place], [address] Phone: [phone]   Fax:  [phone]  Physical Therapy Treatment  Patient Details  Name: Susan Campos MRN: [id number] Date of Birth: 12-15-1932 Referring Provider: Dr. Jennings Books  Encounter Date: 07/11/2016      PT End of Session - 07/11/16 1019    Visit Number 4   Number of Visits 12   Date for PT Re-Evaluation 08/10/16   Authorization Type gcode 4/10   PT Start Time 1012   PT Stop Time 1100   PT Time Calculation (min) 48 min   Equipment Utilized During Treatment Gait belt   Activity Tolerance Patient tolerated treatment well   Behavior During Therapy [facility] for tasks assessed/performed      Past Medical History:  Diagnosis Date  . Arthritis    HIPS  . Asthma    HX OF  . Cancer ([place])    skin  . Orthopnea    sleeps in recliner, cannot lay flat on her back  . Stroke [facility])    CVA 2012/ MEMORY LOSS, 1999, 1 more in 2011  . Vertigo    OCCASIONAL    Past Surgical History:  Procedure Laterality Date  . CATARACT EXTRACTION Right   . CATARACT EXTRACTION W/PHACO Left 05/27/2015   Procedure: CATARACT EXTRACTION PHACO AND INTRAOCULAR LENS PLACEMENT (IOC);  Surgeon: Leandrew Koyanagi, MD;  Location: [place];  Service: Ophthalmology;  Laterality: Left;  . KNEE ARTHROSCOPY    . MELANOMA EXCISION     from chest  . MOUTH SURGERY    . REPLACEMENT TOTAL KNEE BILATERAL    . UTERINE SUSPENSION      There were no vitals filed for this visit.      Subjective Assessment - 07/11/16 1018    Subjective Patient reports over doing it in cardiac rehab on Wednesday. She reports that she wasn't able to walk on Thursday/friday. Patient denies any pain but reports that she was real sore last week;    Pertinent History personal factors affecting rehab: age, history of CVA, chronic condition, arthritis, CKD stage III, impaired memory   Limitations  Walking;Other (comment)  balance, RUE stiffness, coordination;    Diagnostic tests MRI shows multiple old small lacunar infarcts;    Patient Stated Goals Improve balance and endurance; improve RUE joint mobility for fine motor tasks;    Currently in Pain? No/denies         TREATMENT: Warm up on Nustep BUE/BLE level 2 x4 min (unbilled);  Seated: RUE only: Wrist flexion/extension stretch 15 sec hold x3 each; Wrist flexion/extension 1#  2x10 each; RUE turning velcro key, large circle with small key turn, and medium size circle with just 2 fingers x1 rep each; RUE clothespins yellow, red, green with 3 finger grip x4 each x2 sets;  Patient required min-moderate verbal/tactile cues for correct exercise technique. Required cues to avoid full UE movement but to isolate RUE wrist movement. Also required cues to increase hold time with stretch for better mobility;  Standing red tband hip abduction x12  Bilaterally; Standing red tband hip extension x12 bilaterally;  Sit<>stand from regular chair x5 reps with arms across chest; min VCs to increase forward lean;   Patient required min-moderate verbal/tactile cues for correct exercise technique.  Leg press: BLE plate 90# [id number], with min VCs for correct positioning and to slow down LE movement coming back;  RLE plate 60# [id number] with min Vcs to slow down eccentric  return for better strengthening;  Standing on upside down 1/2 bolster (flat side up): Heel/toe raises x15 with cues to improve ROM for better stretch; Feet apart, with BUE wand flexion x10 with cues to improve ankle strategies and hip strategies for better balance control;                         PT Education - 07/11/16 1018    Education provided Yes   Education Details ROM/strengthening, balance exercise   Person(s) Educated Patient   Methods Explanation;Verbal cues   Comprehension Verbalized understanding;Returned demonstration;Verbal cues required              PT Long Term Goals - 06/29/16 1206      PT LONG TERM GOAL #1   Title Patient will be independent in home exercise program to improve strength/mobility for better functional independence with ADLs.   Time 6   Period Weeks   Status New     PT LONG TERM GOAL #2   Title pt will improve berg balance score by 6 pts to reduce risk of falls .   Time 6   Period Weeks   Status New     PT LONG TERM GOAL #3   Title pt will reduce 5x sit to stand time to < 15 s demosntrating improved LE strength.    Time 6   Period Weeks   Status New     PT LONG TERM GOAL #4   Title Patient will increase BUE and BLE gross strength to 4+/5 as to improve functional strength for independent gait, increased standing tolerance and increased ADL ability.   Time 6   Period Weeks   Status New     PT LONG TERM GOAL #5   Title  Patient will decrease Quick DASH score by > 8 points demonstrating reduced self-reported upper extremity disability.   Time 6   Period Weeks   Status New               Plan - 07/11/16 1150    Clinical Impression Statement Patient instructed in advanced RUE wrist stretches and strengthening exercise. Initiated resisted exercise with RUE hand/wrist for improved strengthening. In addition, instructed patient in LE strengthening exercise for improved mobility. Patient would benefit from additional skilled PT intervention to improve balance/gait safety and return to PLOF.    Rehab Potential Good   Clinical Impairments Affecting Rehab Potential positive indicator: good PLOF, good response from prior therapy; negative: chronic condition, co-morbidities; Patient's clinical presentation is evolving as she is experiencing an exacerbation of impaired balance and weakness;    PT Frequency 2x / week   PT Duration 6 weeks   PT Treatment/Interventions Cryotherapy;Electrical Stimulation;Gait training;Moist Heat;Stair training;Functional mobility training;Therapeutic  activities;Therapeutic exercise;Balance training;Neuromuscular re-education;Manual techniques;Patient/family education   PT Next Visit Plan initiate HEP, balance, right sided strengthening   PT Home Exercise Plan advanced, see patient instructions;    Consulted and Agree with Plan of Care Patient      Patient will benefit from skilled therapeutic intervention in order to improve the following deficits and impairments:  Abnormal gait, Decreased mobility, Decreased coordination, Decreased activity tolerance, Decreased endurance, Decreased strength, Impaired flexibility, Difficulty walking, Decreased safety awareness, Decreased balance  Visit Diagnosis: Unsteadiness on feet  Muscle weakness (generalized)  Unspecified lack of coordination     Problem List Patient Active Problem List   Diagnosis Date Noted  . Late effects of CVA (cerebrovascular accident) 05/05/2016  . Ataxia  04/20/2016  . Osteopenia 01/09/2016  . Arthritis 09/24/2015  . Airway hyperreactivity 09/24/2015  . Benign hypertension 09/24/2015  . Chronic kidney disease (CKD), stage III (moderate) 09/24/2015  . Colitis 09/24/2015  . Esophagitis, reflux 09/24/2015  . History of cardioembolic cerebrovascular accident (CVA) 09/24/2015  . Decreased potassium in the blood 09/24/2015  . History of malignant melanoma of skin 09/24/2015  . Obesity 09/24/2015  . Temporary cerebral vascular dysfunction 09/24/2015  . Hypercholesteremia 08/17/2015    Katey Barrie PT, DPT 07/11/2016, 11:52 AM  [place] MAIN [facility] SERVICES [address] [place], [place], [address] Phone: [phone]   Fax:  [phone]  Name: Marjarie Mcfarlain MRN: [id number] Date of Birth: 1933-04-22

## 2016-07-13 ENCOUNTER — Encounter: Payer: Self-pay | Admitting: Physical Therapy

## 2016-07-13 ENCOUNTER — Ambulatory Visit: Payer: Medicare Other | Admitting: Physical Therapy

## 2016-07-13 DIAGNOSIS — R279 Unspecified lack of coordination: Secondary | ICD-10-CM | POA: Diagnosis not present

## 2016-07-13 DIAGNOSIS — R2681 Unsteadiness on feet: Secondary | ICD-10-CM | POA: Diagnosis not present

## 2016-07-13 DIAGNOSIS — M6281 Muscle weakness (generalized): Secondary | ICD-10-CM | POA: Diagnosis not present

## 2016-07-13 NOTE — Therapy (Signed)
[place] MAIN [facility] SERVICES [address] [place], [place], [address] Phone: [phone]   Fax:  [phone]  Physical Therapy Treatment  Patient Details  Name: Susan Campos MRN: [id number] Date of Birth: 22-Aug-1932 Referring Provider: Dr. Jennings Books  Encounter Date: 07/13/2016      PT End of Session - 07/13/16 1314    Visit Number 5   Number of Visits 12   Date for PT Re-Evaluation 08/10/16   Authorization Type gcode 5   Authorization Time Period 10   PT Start Time 1300   PT Stop Time 1345   PT Time Calculation (min) 45 min   Equipment Utilized During Treatment Gait belt   Activity Tolerance Patient tolerated treatment well   Behavior During Therapy [facility] for tasks assessed/performed      Past Medical History:  Diagnosis Date  . Arthritis    HIPS  . Asthma    HX OF  . Cancer ([place])    skin  . Orthopnea    sleeps in recliner, cannot lay flat on her back  . Stroke [facility])    CVA 2012/ MEMORY LOSS, 1999, 1 more in 2011  . Vertigo    OCCASIONAL    Past Surgical History:  Procedure Laterality Date  . CATARACT EXTRACTION Right   . CATARACT EXTRACTION W/PHACO Left 05/27/2015   Procedure: CATARACT EXTRACTION PHACO AND INTRAOCULAR LENS PLACEMENT (IOC);  Surgeon: Leandrew Koyanagi, MD;  Location: [place];  Service: Ophthalmology;  Laterality: Left;  . KNEE ARTHROSCOPY    . MELANOMA EXCISION     from chest  . MOUTH SURGERY    . REPLACEMENT TOTAL KNEE BILATERAL    . UTERINE SUSPENSION      There were no vitals filed for this visit.      Subjective Assessment - 07/13/16 1313    Subjective Patient reports doing okay; She presents to therapy with a pot holder she created with sewing. She reports that her stitches are still a little off. She denies any pain;    Pertinent History personal factors affecting rehab: age, history of CVA, chronic condition, arthritis, CKD stage III, impaired memory   Limitations Walking;Other (comment)  balance, RUE stiffness, coordination;    Diagnostic tests MRI shows multiple old small lacunar infarcts;    Patient Stated Goals Improve balance and endurance; improve RUE joint mobility for fine motor tasks;    Currently in Pain? No/denies        TREATMENT: Warm up on Nustep BUE/BLE level 2 x4 min (unbilled);  Seated: RUE only: Provided red putty with written handout for HEP: Gross grip x15 reps; Finger pinch x5 reps; Finger pinch with pull x5 reps;  Patient required min-moderate verbal/tactile cues for correct exercise technique.Required cues to avoid full UE movement but to isolate RUE finger movement.  Leg press: RLE only plate 60# heel raises 2x12 with min VCs for sequencing and positioning;  RLE plate 60# [id number] with min Vcs to slow down eccentric return for better strengthening;  Resisted weighted gait 12.5# forward/backward x2 laps each;  Standing on airex: alternate toe taps to 4inch step x15 bilaterally with 1-0 rail assist; One foot on airex, one foot on step with BUE ball pass side/side x5 each foot in front; Modified tandem stance on airex with BUE ball up/down x10 each foot in front;  Patient required CGA for safety with balance; Patient required min-moderate verbal/tactile cues for correct exercise technique.  PT Education - 07/13/16 1313    Education provided Yes   Education Details ROM/strenthening, balance exercise   Person(s) Educated Patient   Methods Explanation;Verbal cues   Comprehension Verbalized understanding;Returned demonstration;Verbal cues required             PT Long Term Goals - 06/29/16 1206      PT LONG TERM GOAL #1   Title Patient will be independent in home exercise program to improve strength/mobility for better functional independence with ADLs.   Time 6   Period Weeks   Status New     PT LONG TERM GOAL #2   Title pt will improve  berg balance score by 6 pts to reduce risk of falls .   Time 6   Period Weeks   Status New     PT LONG TERM GOAL #3   Title pt will reduce 5x sit to stand time to < 15 s demosntrating improved LE strength.    Time 6   Period Weeks   Status New     PT LONG TERM GOAL #4   Title Patient will increase BUE and BLE gross strength to 4+/5 as to improve functional strength for independent gait, increased standing tolerance and increased ADL ability.   Time 6   Period Weeks   Status New     PT LONG TERM GOAL #5   Title  Patient will decrease Quick DASH score by > 8 points demonstrating reduced self-reported upper extremity disability.   Time 6   Period Weeks   Status New               Plan - 07/13/16 1353    Clinical Impression Statement Patient instructed in advanced balance exercise. She required min Vcs for better trunk control and weight shift for increased stance control; Patient able to tolerate increased repetition in leg press well with improved LE strengthening; Advanced HEP with putty exercise for better RUE hand movement and hand dexterity;    Rehab Potential Good   Clinical Impairments Affecting Rehab Potential positive indicator: good PLOF, good response from prior therapy; negative: chronic condition, co-morbidities; Patient's clinical presentation is evolving as she is experiencing an exacerbation of impaired balance and weakness;    PT Frequency 2x / week   PT Duration 6 weeks   PT Treatment/Interventions Cryotherapy;Electrical Stimulation;Gait training;Moist Heat;Stair training;Functional mobility training;Therapeutic activities;Therapeutic exercise;Balance training;Neuromuscular re-education;Manual techniques;Patient/family education   PT Next Visit Plan initiate HEP, balance, right sided strengthening   PT Home Exercise Plan advanced, see patient instructions;    Consulted and Agree with Plan of Care Patient      Patient will benefit from skilled therapeutic  intervention in order to improve the following deficits and impairments:  Abnormal gait, Decreased mobility, Decreased coordination, Decreased activity tolerance, Decreased endurance, Decreased strength, Impaired flexibility, Difficulty walking, Decreased safety awareness, Decreased balance  Visit Diagnosis: Unsteadiness on feet  Muscle weakness (generalized)  Unspecified lack of coordination     Problem List Patient Active Problem List   Diagnosis Date Noted  . Late effects of CVA (cerebrovascular accident) 05/05/2016  . Ataxia 04/20/2016  . Osteopenia 01/09/2016  . Arthritis 09/24/2015  . Airway hyperreactivity 09/24/2015  . Benign hypertension 09/24/2015  . Chronic kidney disease (CKD), stage III (moderate) 09/24/2015  . Colitis 09/24/2015  . Esophagitis, reflux 09/24/2015  . History of cardioembolic cerebrovascular accident (CVA) 09/24/2015  . Decreased potassium in the blood 09/24/2015  . History of malignant melanoma of skin 09/24/2015  . Obesity  09/24/2015  . Temporary cerebral vascular dysfunction 09/24/2015  . Hypercholesteremia 08/17/2015    Aileana Hodder PT, DPT 07/13/2016, 1:54 PM  [place] MAIN [facility] SERVICES [address] [place], [place], [address] Phone: [phone]   Fax:  [phone]  Name: Susan Campos MRN: [id number] Date of Birth: [birth year]

## 2016-07-13 NOTE — Patient Instructions (Addendum)
Flexion (Resistive Putty)    Keeping fingertips straight, press putty towards base of palm. Repeat __10__ times. Do ___2_ sessions per day.  Copyright  VHI. All rights reserved.  Pinch: Palmar    Pinch putty with right thumb and each fingertip in turn. Repeat _5___ times. Do _2___ sessions per day. Activity: Peel fruit such as lemons or oranges.* Peel stickers off surfaces.  Copyright  VHI. All rights reserved.  Palmar Pinch Strengthening (Resistive Putty)    Pinch putty between thumb and each fingertip and then pull away from center; Repeat 5 times each finger, 2x a day  Copyright  VHI. All rights reserved.

## 2016-07-18 ENCOUNTER — Ambulatory Visit: Payer: Medicare Other | Admitting: Physical Therapy

## 2016-07-18 ENCOUNTER — Encounter: Payer: Self-pay | Admitting: Physical Therapy

## 2016-07-18 DIAGNOSIS — M6281 Muscle weakness (generalized): Secondary | ICD-10-CM | POA: Diagnosis not present

## 2016-07-18 DIAGNOSIS — R279 Unspecified lack of coordination: Secondary | ICD-10-CM

## 2016-07-18 DIAGNOSIS — R2681 Unsteadiness on feet: Secondary | ICD-10-CM

## 2016-07-18 NOTE — Therapy (Signed)
[place] MAIN [facility] SERVICES [address] [place], [place], [address] Phone: [phone]   Fax:  [phone]  Physical Therapy Treatment  Patient Details  Name: Susan Campos MRN: [id number] Date of Birth: January 18, 1933 Referring Provider: Dr. Jennings Books  Encounter Date: 07/18/2016      PT End of Session - 07/18/16 1021    Visit Number 6   Number of Visits 12   Date for PT Re-Evaluation 08/10/16   Authorization Type gcode 6   Authorization Time Period 10   PT Start Time 1015   PT Stop Time 1100   PT Time Calculation (min) 45 min   Equipment Utilized During Treatment Gait belt   Activity Tolerance Patient tolerated treatment well   Behavior During Therapy [facility] for tasks assessed/performed      Past Medical History:  Diagnosis Date  . Arthritis    HIPS  . Asthma    HX OF  . Cancer ([place])    skin  . Orthopnea    sleeps in recliner, cannot lay flat on her back  . Stroke [facility])    CVA 2012/ MEMORY LOSS, 1999, 1 more in 2011  . Vertigo    OCCASIONAL    Past Surgical History:  Procedure Laterality Date  . CATARACT EXTRACTION Right   . CATARACT EXTRACTION W/PHACO Left 05/27/2015   Procedure: CATARACT EXTRACTION PHACO AND INTRAOCULAR LENS PLACEMENT (IOC);  Surgeon: Leandrew Koyanagi, MD;  Location: [place];  Service: Ophthalmology;  Laterality: Left;  . KNEE ARTHROSCOPY    . MELANOMA EXCISION     from chest  . MOUTH SURGERY    . REPLACEMENT TOTAL KNEE BILATERAL    . UTERINE SUSPENSION      There were no vitals filed for this visit.      Subjective Assessment - 07/18/16 1021    Subjective Patient reports doing well this morning. She reports, "I really enjoyed working with the putty this weekend." She reports no new changes;    Pertinent History personal factors affecting rehab: age, history of CVA, chronic condition, arthritis, CKD stage III, impaired memory   Limitations Walking;Other (comment)   balance, RUE stiffness, coordination;    Diagnostic tests MRI shows multiple old small lacunar infarcts;    Patient Stated Goals Improve balance and endurance; improve RUE joint mobility for fine motor tasks;    Currently in Pain? No/denies         TREATMENT: Warm up on Nustep BUE/BLE level 2 x4 min (unbilled);  Seated: RUE only: Wrist flexion/extension stretch 15 sec hold x3 each; RUE turning velcro key, large circle with small key turn, and medium size circle with just 2 fingers x1 rep each; Wrist ROM with moving washer along yellow geometric obstacles x4 reps;  RUE clothespins blue, red, green with 3 finger grip x4 each x2 sets;  Patient required min-moderate verbal/tactile cues for correct exercise technique.Required cues to avoid full UE movement but to isolate RUE finger movement.  Leg press: BLE 105# 2x10 with min VCs to slow down eccentric return for better strengthening; BLE 105# heel raises 2x10 with min VCs for positioning;    Resisted weighted gait 7.5# forward/backward, side stepping x2 laps each; Patient required close supervision but was able to demonstrate good weight shift for better gait control.  Patient required min-moderate verbal/tactile cues for correct exercise technique.  PT Education - 07/18/16 1021    Education provided Yes   Education Details ROM/strengthening,  balance exercise;    Person(s) Educated Patient   Methods Explanation;Verbal cues   Comprehension Verbalized understanding;Returned demonstration;Verbal cues required             PT Long Term Goals - 06/29/16 1206      PT LONG TERM GOAL #1   Title Patient will be independent in home exercise program to improve strength/mobility for better functional independence with ADLs.   Time 6   Period Weeks   Status New     PT LONG TERM GOAL #2   Title pt will improve berg balance score by 6 pts to reduce risk of falls .    Time 6   Period Weeks   Status New     PT LONG TERM GOAL #3   Title pt will reduce 5x sit to stand time to < 15 s demosntrating improved LE strength.    Time 6   Period Weeks   Status New     PT LONG TERM GOAL #4   Title Patient will increase BUE and BLE gross strength to 4+/5 as to improve functional strength for independent gait, increased standing tolerance and increased ADL ability.   Time 6   Period Weeks   Status New     PT LONG TERM GOAL #5   Title  Patient will decrease Quick DASH score by > 8 points demonstrating reduced self-reported upper extremity disability.   Time 6   Period Weeks   Status New               Plan - 07/18/16 1206    Clinical Impression Statement Patient instructed in advanced LE strengtheing and balance exercise. She was able to demonstrate better weight shift with resisted weighted gait requiring only close supervision for safety; Patient demonstrates improved UE strength being able to clip clothespins easier with less fingers. She did struggle some with two finger twist with velcro movement. Patient would benefit from additional skilled PT intervention to improve RUE/LE strength and coordination.    Rehab Potential Good   Clinical Impairments Affecting Rehab Potential positive indicator: good PLOF, good response from prior therapy; negative: chronic condition, co-morbidities; Patient's clinical presentation is evolving as she is experiencing an exacerbation of impaired balance and weakness;    PT Frequency 2x / week   PT Duration 6 weeks   PT Treatment/Interventions Cryotherapy;Electrical Stimulation;Gait training;Moist Heat;Stair training;Functional mobility training;Therapeutic activities;Therapeutic exercise;Balance training;Neuromuscular re-education;Manual techniques;Patient/family education   PT Next Visit Plan initiate HEP, balance, right sided strengthening   PT Home Exercise Plan advanced, see patient instructions;    Consulted and Agree  with Plan of Care Patient      Patient will benefit from skilled therapeutic intervention in order to improve the following deficits and impairments:  Abnormal gait, Decreased mobility, Decreased coordination, Decreased activity tolerance, Decreased endurance, Decreased strength, Impaired flexibility, Difficulty walking, Decreased safety awareness, Decreased balance  Visit Diagnosis: Unsteadiness on feet  Muscle weakness (generalized)  Unspecified lack of coordination     Problem List Patient Active Problem List   Diagnosis Date Noted  . Late effects of CVA (cerebrovascular accident) 05/05/2016  . Ataxia 04/20/2016  . Osteopenia 01/09/2016  . Arthritis 09/24/2015  . Airway hyperreactivity 09/24/2015  . Benign hypertension 09/24/2015  . Chronic kidney disease (CKD), stage III (moderate) 09/24/2015  . Colitis 09/24/2015  . Esophagitis, reflux 09/24/2015  . History of cardioembolic cerebrovascular accident (CVA) 09/24/2015  .  Decreased potassium in the blood 09/24/2015  . History of malignant melanoma of skin 09/24/2015  . Obesity 09/24/2015  . Temporary cerebral vascular dysfunction 09/24/2015  . Hypercholesteremia 08/17/2015    Louie Flenner PT,DPT 07/18/2016, 12:08 PM  [place] MAIN [facility] SERVICES [address] [place], [place], [address] Phone: [phone]   Fax:  [phone]  Name: Susan Campos MRN: [id number] Date of Birth: 23-Mar-1933

## 2016-07-19 DIAGNOSIS — Z96651 Presence of right artificial knee joint: Secondary | ICD-10-CM | POA: Diagnosis not present

## 2016-07-19 DIAGNOSIS — M25561 Pain in right knee: Secondary | ICD-10-CM | POA: Diagnosis not present

## 2016-07-19 DIAGNOSIS — M25562 Pain in left knee: Secondary | ICD-10-CM | POA: Diagnosis not present

## 2016-07-19 DIAGNOSIS — Z96652 Presence of left artificial knee joint: Secondary | ICD-10-CM | POA: Diagnosis not present

## 2016-07-20 ENCOUNTER — Encounter: Payer: Self-pay | Admitting: Physical Therapy

## 2016-07-20 ENCOUNTER — Ambulatory Visit: Payer: Medicare Other | Admitting: Physical Therapy

## 2016-07-20 DIAGNOSIS — M6281 Muscle weakness (generalized): Secondary | ICD-10-CM | POA: Diagnosis not present

## 2016-07-20 DIAGNOSIS — R279 Unspecified lack of coordination: Secondary | ICD-10-CM

## 2016-07-20 DIAGNOSIS — R2681 Unsteadiness on feet: Secondary | ICD-10-CM | POA: Diagnosis not present

## 2016-07-20 NOTE — Therapy (Signed)
[place] MAIN [facility] SERVICES [address] [place], [place], [address] Phone: [phone]   Fax:  [phone]  Physical Therapy Treatment  Patient Details  Name: Susan Campos MRN: [id number] Date of Birth: Feb 26, 1933 Referring Provider: Dr. Jennings Books  Encounter Date: 07/20/2016      PT End of Session - 07/20/16 1310    Visit Number 7   Number of Visits 12   Date for PT Re-Evaluation 08/10/16   Authorization Type gcode 7   Authorization Time Period 10   PT Start Time 1300   PT Stop Time 1345   PT Time Calculation (min) 45 min   Equipment Utilized During Treatment Gait belt   Activity Tolerance Patient tolerated treatment well   Behavior During Therapy [facility] for tasks assessed/performed      Past Medical History:  Diagnosis Date  . Arthritis    HIPS  . Asthma    HX OF  . Cancer ([place])    skin  . Orthopnea    sleeps in recliner, cannot lay flat on her back  . Stroke [facility])    CVA 2012/ MEMORY LOSS, 1999, 1 more in 2011  . Vertigo    OCCASIONAL    Past Surgical History:  Procedure Laterality Date  . CATARACT EXTRACTION Right   . CATARACT EXTRACTION W/PHACO Left 05/27/2015   Procedure: CATARACT EXTRACTION PHACO AND INTRAOCULAR LENS PLACEMENT (IOC);  Surgeon: Leandrew Koyanagi, MD;  Location: [place];  Service: Ophthalmology;  Laterality: Left;  . KNEE ARTHROSCOPY    . MELANOMA EXCISION     from chest  . MOUTH SURGERY    . REPLACEMENT TOTAL KNEE BILATERAL    . UTERINE SUSPENSION      There were no vitals filed for this visit.      Subjective Assessment - 07/20/16 1309    Subjective Patient reports doing well; She reports, "I was able to put lotion on my face with my right hand for the first time yesterday." She reports really enjoys working with the putty and is compliant with HEP;    Pertinent History personal factors affecting rehab: age, history of CVA, chronic condition, arthritis, CKD stage  III, impaired memory   Limitations Walking;Other (comment)  balance, RUE stiffness, coordination;    Diagnostic tests MRI shows multiple old small lacunar infarcts;    Patient Stated Goals Improve balance and endurance; improve RUE joint mobility for fine motor tasks;    Currently in Pain? No/denies          TREATMENT: Warm up on Nustep BUE/BLE level 2 x4 min (unbilled);  Seated: RUE only: Wrist flexion/extension 2# x15 each RUE pronation/supination with hammer to improve RUE wrist flexibility x10 each direction; RUE holding yellow geometric handle with moving washer in multiple direction to improve wrist flexibility x3 min;   Patient required min-moderate verbal/tactile cues for correct exercise technique.Required cues to avoid full UE movement but to isolate RUE finger movement.  Standing on 1/2 bolster: Heel/toe raises x15 BUE wand flexion 2# x10 with min VCs to improve weight shift for better balance;  Standing on airex: Modified tandem with BUE ball pass side/side x10 each foot in front; Standing with feet apart: balloon taps x2 min with close supervision for balance; Patient able to demonstrate good reach with RUE for better challenge;  Sit<>Stand with yellow weighted ball overhead press x5 reps; Patient required min Vcs to increase forward lean for better sit<>stand ability;  PT Education - 07/20/16 1310    Education provided Yes   Education Details ROM/strengthening, balance exercise   Person(s) Educated Patient   Methods Explanation;Verbal cues   Comprehension Verbalized understanding;Returned demonstration;Verbal cues required             PT Long Term Goals - 06/29/16 1206      PT LONG TERM GOAL #1   Title Patient will be independent in home exercise program to improve strength/mobility for better functional independence with ADLs.   Time 6   Period Weeks   Status New     PT LONG TERM GOAL #2   Title pt will  improve berg balance score by 6 pts to reduce risk of falls .   Time 6   Period Weeks   Status New     PT LONG TERM GOAL #3   Title pt will reduce 5x sit to stand time to < 15 s demosntrating improved LE strength.    Time 6   Period Weeks   Status New     PT LONG TERM GOAL #4   Title Patient will increase BUE and BLE gross strength to 4+/5 as to improve functional strength for independent gait, increased standing tolerance and increased ADL ability.   Time 6   Period Weeks   Status New     PT LONG TERM GOAL #5   Title  Patient will decrease Quick DASH score by > 8 points demonstrating reduced self-reported upper extremity disability.   Time 6   Period Weeks   Status New               Plan - 07/20/16 1350    Clinical Impression Statement Patient instructed in advanced balance exercise. Patient demonstrates improved stance control requiring less rail assist when standing on uneven surface; Patient demonstrates improved wrist flexibility and UE strength tolerating increased resistance. Patient is still slow with ambulation and would benefit from additional skilled PT intervention to improve balance/gait safety and right side strength for return to PLOF.    Rehab Potential Good   Clinical Impairments Affecting Rehab Potential positive indicator: good PLOF, good response from prior therapy; negative: chronic condition, co-morbidities; Patient's clinical presentation is evolving as she is experiencing an exacerbation of impaired balance and weakness;    PT Frequency 2x / week   PT Duration 6 weeks   PT Treatment/Interventions Cryotherapy;Electrical Stimulation;Gait training;Moist Heat;Stair training;Functional mobility training;Therapeutic activities;Therapeutic exercise;Balance training;Neuromuscular re-education;Manual techniques;Patient/family education   PT Next Visit Plan initiate HEP, balance, right sided strengthening   PT Home Exercise Plan advanced, see patient  instructions;    Consulted and Agree with Plan of Care Patient      Patient will benefit from skilled therapeutic intervention in order to improve the following deficits and impairments:  Abnormal gait, Decreased mobility, Decreased coordination, Decreased activity tolerance, Decreased endurance, Decreased strength, Impaired flexibility, Difficulty walking, Decreased safety awareness, Decreased balance  Visit Diagnosis: Unsteadiness on feet  Muscle weakness (generalized)  Unspecified lack of coordination     Problem List Patient Active Problem List   Diagnosis Date Noted  . Late effects of CVA (cerebrovascular accident) 05/05/2016  . Ataxia 04/20/2016  . Osteopenia 01/09/2016  . Arthritis 09/24/2015  . Airway hyperreactivity 09/24/2015  . Benign hypertension 09/24/2015  . Chronic kidney disease (CKD), stage III (moderate) 09/24/2015  . Colitis 09/24/2015  . Esophagitis, reflux 09/24/2015  . History of cardioembolic cerebrovascular accident (CVA) 09/24/2015  . Decreased potassium in the blood 09/24/2015  . History  of malignant melanoma of skin 09/24/2015  . Obesity 09/24/2015  . Temporary cerebral vascular dysfunction 09/24/2015  . Hypercholesteremia 08/17/2015    Mirinda Monte PT, DPT 07/20/2016, 1:53 PM  [place] MAIN [facility] SERVICES [address] [place], [place], [address] Phone: [phone]   Fax:  [phone]  Name: Tamiya Cisnero MRN: [id number] Date of Birth: Sep 03, 1932

## 2016-07-22 ENCOUNTER — Encounter: Payer: Self-pay | Admitting: Family Medicine

## 2016-07-22 ENCOUNTER — Ambulatory Visit (INDEPENDENT_AMBULATORY_CARE_PROVIDER_SITE_OTHER): Payer: Medicare Other | Admitting: Family Medicine

## 2016-07-22 VITALS — BP 120/70 | HR 70 | Temp 98.5°F | Resp 16 | Ht 63.0 in | Wt 198.0 lb

## 2016-07-22 DIAGNOSIS — I679 Cerebrovascular disease, unspecified: Secondary | ICD-10-CM

## 2016-07-22 DIAGNOSIS — E785 Hyperlipidemia, unspecified: Secondary | ICD-10-CM | POA: Diagnosis not present

## 2016-07-22 DIAGNOSIS — I10 Essential (primary) hypertension: Secondary | ICD-10-CM | POA: Diagnosis not present

## 2016-07-22 NOTE — Progress Notes (Signed)
Patient: Susan Campos Female    DOB: October 01, 1932   82 y.o.   MRN: [id number] Visit Date: 07/22/2016  Today's Provider: Lelon Huh, MD   Chief Complaint  Patient presents with  . Follow-up   Subjective:    HPI     Hypertension, follow-up:  BP Readings from Last 3 Encounters:  07/22/16 120/70  04/20/16 132/60  01/20/16 140/66    She was last seen for hypertension 3 months ago.  BP at that visit was 132/60. Management since that visit includes; Reduce Dyazide to 37.5-12.5 and started on lisinopril due to polyuria, which has improved.  .She reports good compliance with treatment. She is having side effects. none She is exercising. She is adherent to low salt diet.   Outside blood pressures are n/a. She is experiencing none.  Patient denies none.   Cardiovascular risk factors include none.  Use of agents associated with hypertension: none.   ----------------------------------------------------------------  Follow up lipids.  Lab Results  Component Value Date   CHOL 210 (H) 01/20/2016   HDL 35 (L) 01/20/2016   LDLCALC 119 (H) 01/20/2016   TRIG 279 (H) 01/20/2016   CHOLHDL 6.0 (H) 01/20/2016   Was prescribed 10mg  simvastatin after lipids checked in June due to history of cerebrovascular disease. She continues regular follow up with Dr. Manuella Ghazi for multifactorial dementia.   Allergies  Allergen Reactions  . Lovastatin Shortness Of Breath and Palpitations     Current Outpatient Prescriptions:  .  acetaminophen (TYLENOL ARTHRITIS PAIN) 650 MG CR tablet, Take by mouth., Disp: , Rfl:  .  albuterol (PROAIR HFA) 108 (90 Base) MCG/ACT inhaler, Inhale 2 puffs into the lungs every 6 (six) hours as needed for wheezing or shortness of breath., Disp: 18 g, Rfl: 1 .  aspirin EC 81 MG tablet, Take 81 mg by mouth daily. 9 am, Disp: , Rfl:  .  diazepam (VALIUM) 5 MG tablet, Take one tablet one hour prior to procedure, Disp: 30 tablet, Rfl: 1 .  lisinopril  (PRINIVIL,ZESTRIL) 5 MG tablet, Take 1 tablet (5 mg total) by mouth daily., Disp: 30 tablet, Rfl: 2 .  MEGARED OMEGA-3 KRILL OIL PO, Take 1 capsule by mouth daily., Disp: , Rfl:  .  memantine (NAMENDA) 5 MG tablet, Take 1 tablet by mouth 2 (two) times daily., Disp: , Rfl:  .  Multiple Vitamin (MULTIVITAMIN) capsule, Take 1 capsule by mouth daily. 9 AM, Disp: , Rfl:  .  simvastatin (ZOCOR) 10 MG tablet, Take 1 tablet (10 mg total) by mouth every evening., Disp: 90 tablet, Rfl: 3 .  triamterene-hydrochlorothiazide (DYAZIDE) 37.5-25 MG capsule, Take 1 each (1 capsule total) by mouth daily., Disp: 30 capsule, Rfl: 5 .  FLUZONE HIGH-DOSE 0.5 ML SUSY, ADM 0.5ML IM UTD, Disp: , Rfl: 0  Review of Systems  Constitutional: Negative for appetite change, chills, fatigue and fever.  Respiratory: Negative for chest tightness and shortness of breath.   Cardiovascular: Negative for chest pain and palpitations.  Gastrointestinal: Negative for abdominal pain, nausea and vomiting.  Neurological: Negative for dizziness and weakness.    Social History  Substance Use Topics  . Smoking status: Former Smoker    Packs/day: 0.50    Years: 4.00    Types: Cigarettes  . Smokeless tobacco: Never Used  . Alcohol use No   Objective:   BP 120/70 (BP Location: Right Arm, Patient Position: Sitting, Cuff Size: Large)   Pulse 70   Temp 98.5 F (36.9 C) (Oral)  Resp 16   Ht 5\' 3"  (1.6 m)   Wt 198 lb (89.8 kg)   SpO2 95%   BMI 35.07 kg/m   Physical Exam   General Appearance:    Alert, cooperative, no distress  Eyes:    PERRL, conjunctiva/corneas clear, EOM's intact       Lungs:     Clear to auscultation bilaterally, respirations unlabored  Heart:    Regular rate and rhythm  Neurologic:   Awake, alert, oriented x 3. No apparent focal neurological           defect.           Assessment & Plan:     1. Benign hypertension Doing better with reduction of diuretic and addition of ACEI. Continue current  medications.   - Renal function panel  2. Dyslipidemia She is tolerating simvastatin well with no adverse effects.   - Lipid panel - Hepatic function panel  3. Cerebrovascular disease Stable. Continue current BP medications. Consider statin adjustment.   The entirety of the information documented in the History of Present Illness, Review of Systems and Physical Exam were personally obtained by me. Portions of this information were initially documented by April M. Sabra Heck, CMA and reviewed by me for thoroughness and accuracy.   Return in about 5 months (around 12/20/2016).       Lelon Huh, MD  [facility]

## 2016-07-23 LAB — LIPID PANEL
CHOLESTEROL TOTAL: 194 mg/dL (ref 100–199)
Chol/HDL Ratio: 4.9 ratio units — ABNORMAL HIGH (ref 0.0–4.4)
HDL: 40 mg/dL (ref >39)
LDL Calculated: 115 mg/dL — ABNORMAL HIGH (ref 0–99)
TRIGLYCERIDES: 193 mg/dL — AB (ref 0–149)
VLDL Cholesterol Cal: 39 mg/dL (ref 5–40)

## 2016-07-23 LAB — HEPATIC FUNCTION PANEL
ALK PHOS: 93 IU/L (ref 39–117)
ALT: 12 IU/L (ref 0–32)
AST: 14 IU/L (ref 0–40)
Bilirubin Total: 0.6 mg/dL (ref 0.0–1.2)
Bilirubin, Direct: 0.12 mg/dL (ref 0.00–0.40)
TOTAL PROTEIN: 7.1 g/dL (ref 6.0–8.5)

## 2016-07-23 LAB — RENAL FUNCTION PANEL
ALBUMIN: 4.2 g/dL (ref 3.5–4.7)
BUN/Creatinine Ratio: 15 (ref 12–28)
BUN: 16 mg/dL (ref 8–27)
CALCIUM: 9.4 mg/dL (ref 8.7–10.3)
CHLORIDE: 102 mmol/L (ref 96–106)
CO2: 27 mmol/L (ref 18–29)
Creatinine, Ser: 1.09 mg/dL — ABNORMAL HIGH (ref 0.57–1.00)
GFR calc Af Amer: 55 mL/min/{1.73_m2} — ABNORMAL LOW (ref >59)
GFR calc non Af Amer: 48 mL/min/{1.73_m2} — ABNORMAL LOW (ref >59)
Glucose: 73 mg/dL (ref 65–99)
Phosphorus: 2.7 mg/dL (ref 2.5–4.5)
Potassium: 3.8 mmol/L (ref 3.5–5.2)
Sodium: 144 mmol/L (ref 134–144)

## 2016-07-27 ENCOUNTER — Telehealth: Payer: Self-pay

## 2016-07-27 ENCOUNTER — Encounter: Payer: Medicare Other | Admitting: Physical Therapy

## 2016-07-27 DIAGNOSIS — E7800 Pure hypercholesterolemia, unspecified: Secondary | ICD-10-CM

## 2016-07-27 MED ORDER — SIMVASTATIN 20 MG PO TABS: 10.0000 mg | ORAL_TABLET | Freq: Every evening | ORAL | 4 refills | Status: DC

## 2016-07-27 NOTE — Telephone Encounter (Signed)
NA

## 2016-07-27 NOTE — Telephone Encounter (Signed)
Patient advised as below. Patient verbalizes understanding and is in agreement with treatment plan.  

## 2016-07-27 NOTE — Telephone Encounter (Signed)
-----   Message from Birdie Sons, MD sent at 07/26/2016  5:31 PM EST ----- LDL cholesterol down from 119 to 115. Increase simvastatin to 20mg  daily, +90 rf x 1. Follow up 3 months. All other labs are completely normal.

## 2016-08-03 ENCOUNTER — Ambulatory Visit: Payer: Medicare Other | Attending: Neurology | Admitting: Physical Therapy

## 2016-08-03 DIAGNOSIS — M6281 Muscle weakness (generalized): Secondary | ICD-10-CM | POA: Insufficient documentation

## 2016-08-03 DIAGNOSIS — R2681 Unsteadiness on feet: Secondary | ICD-10-CM | POA: Insufficient documentation

## 2016-08-03 DIAGNOSIS — R279 Unspecified lack of coordination: Secondary | ICD-10-CM | POA: Insufficient documentation

## 2016-08-08 ENCOUNTER — Ambulatory Visit: Payer: Medicare Other | Admitting: Physical Therapy

## 2016-08-10 ENCOUNTER — Ambulatory Visit: Payer: Medicare Other | Admitting: Physical Therapy

## 2016-08-10 ENCOUNTER — Encounter: Payer: Self-pay | Admitting: Physical Therapy

## 2016-08-10 DIAGNOSIS — M6281 Muscle weakness (generalized): Secondary | ICD-10-CM

## 2016-08-10 DIAGNOSIS — R2681 Unsteadiness on feet: Secondary | ICD-10-CM | POA: Diagnosis not present

## 2016-08-10 DIAGNOSIS — R279 Unspecified lack of coordination: Secondary | ICD-10-CM | POA: Diagnosis present

## 2016-08-10 NOTE — Therapy (Signed)
[place] MAIN [facility] SERVICES [address] [place], [place], [address] Phone: [phone]   Fax:  [phone]  Physical Therapy Treatment/Progress Note  Patient Details  Name: Susan Campos MRN: [id number] Date of Birth: 1933/05/11 Referring Provider: Dr. Jennings Books  Encounter Date: 08/10/2016      PT End of Session - 08/10/16 1310    Visit Number 8   Number of Visits 20   Date for PT Re-Evaluation 09/07/16   Authorization Type gcode 8   Authorization Time Period 10   PT Start Time 1300   PT Stop Time 1345   PT Time Calculation (min) 45 min   Equipment Utilized During Treatment Gait belt   Activity Tolerance Patient tolerated treatment well   Behavior During Therapy [facility] for tasks assessed/performed      Past Medical History:  Diagnosis Date  . Arthritis    HIPS  . Asthma    HX OF  . Cancer ([place])    skin  . Orthopnea    sleeps in recliner, cannot lay flat on her back  . Stroke [facility])    CVA 2012/ MEMORY LOSS, 1999, 1 more in 2011  . Vertigo    OCCASIONAL    Past Surgical History:  Procedure Laterality Date  . CATARACT EXTRACTION Right   . CATARACT EXTRACTION W/PHACO Left 05/27/2015   Procedure: CATARACT EXTRACTION PHACO AND INTRAOCULAR LENS PLACEMENT (IOC);  Surgeon: Leandrew Koyanagi, MD;  Location: [place];  Service: Ophthalmology;  Laterality: Left;  . KNEE ARTHROSCOPY    . MELANOMA EXCISION     from chest  . MOUTH SURGERY    . REPLACEMENT TOTAL KNEE BILATERAL    . UTERINE SUSPENSION      There were no vitals filed for this visit.      Subjective Assessment - 08/10/16 1309    Subjective Patient reports doing okay; She has missed last few weeks of PT due to her husband being sick, and having a pipe burst at her home. She denies any new falls;    Pertinent History personal factors affecting rehab: age, history of CVA, chronic condition, arthritis, CKD stage III, impaired memory   Limitations Walking;Other (comment)  balance, RUE stiffness, coordination;    Diagnostic tests MRI shows multiple old small lacunar infarcts;    Patient Stated Goals Improve balance and endurance; improve RUE joint mobility for fine motor tasks;    Currently in Pain? No/denies            [facility] PT Assessment - 08/10/16 0001      Strength   Right Hand Grip (lbs) 45   Left Hand Grip (lbs) 40     Standardized Balance Assessment   Five times sit to stand comments  14.5 sec without HHA (<15 sec indicates low fall risk) improved from 06/29/16 which was 18.6 sec   10 Meter Walk 0.86 m/s without AD (home ambulator, limited community ambulator) improved slightly from 06/29/16 which was 0.833 m/s     Berg Balance Test   Sit to Stand Able to stand without using hands and stabilize independently   Standing Unsupported Able to stand safely 2 minutes   Sitting with Back Unsupported but Feet Supported on Floor or Stool Able to sit safely and securely 2 minutes   Stand to Sit Sits safely with minimal use of hands   Transfers Able to transfer safely, minor use of hands   Standing Unsupported with Eyes Closed Able to stand 10 seconds  safely   Standing Ubsupported with Feet Together Able to place feet together independently and stand 1 minute safely   From Standing, Reach Forward with Outstretched Arm Can reach confidently >25 cm (10")   From Standing Position, Pick up Object from Floor Able to pick up shoe safely and easily   From Standing Position, Turn to Look Behind Over each Shoulder Looks behind from both sides and weight shifts well   Turn 360 Degrees Able to turn 360 degrees safely but slowly   Standing Unsupported, Alternately Place Feet on Step/Stool Able to stand independently and safely and complete 8 steps in 20 seconds   Standing Unsupported, One Foot in Front Able to take small step independently and hold 30 seconds   Standing on One Leg Tries to lift leg/unable to hold 3 seconds but  remains standing independently   Total Score 49         TREATMENT: PT instructed patient in 10 meter walk, 5 times sit<>stand, Berg Balance assessment, etc to address goals; See above; She does require min Vcs for correct activity during testing;  Leg press: BLE plate 90# [id number]; Patient required min Vcs to slow down eccentric return and to increase ROM for better strengthening;                       PT Education - 08/10/16 1310    Education provided Yes   Education Details progress towards goals, strengthening   Person(s) Educated Patient   Methods Explanation;Verbal cues   Comprehension Verbalized understanding;Returned demonstration;Verbal cues required             PT Long Term Goals - 08/10/16 1311      PT LONG TERM GOAL #1   Title Patient will be independent in home exercise program to improve strength/mobility for better functional independence with ADLs.   Time 6   Period Weeks   Status On-going     PT LONG TERM GOAL #2   Title pt will improve berg balance score by 6 pts to reduce risk of falls .   Time 6   Period Weeks   Status Partially Met     PT LONG TERM GOAL #3   Title pt will reduce 5x sit to stand time to < 15 s demosntrating improved LE strength.    Time 6   Period Weeks   Status Achieved     PT LONG TERM GOAL #4   Title Patient will increase BUE and BLE gross strength to 4+/5 as to improve functional strength for independent gait, increased standing tolerance and increased ADL ability.   Time 6   Period Weeks   Status Partially Met     PT LONG TERM GOAL #5   Title  Patient will decrease Quick DASH score by > 8 points demonstrating reduced self-reported upper extremity disability.   Time 6   Period Weeks   Status Not Met               Plan - 08/10/16 1533    Clinical Impression Statement Patient has missed a few weeks of physical therapy due to her husband being sick, holidays, having busted water pipes. Patient  reports being limited in doing her HEP due to it being cold and only using 2 rooms of her home. Patient was instructed in outcome measures to determine progress towards goals. She has made some progress with better sit<>stand ability. However her quick dash, berg balance and gait speed scores  have not significantly changed. This could be to limited appointments in last few weeks and not being able to do much activity. Patient was instructed in LE strengthening exercise. She requires min Vcs for correct exercise technique to improve mobility; She would benefit from additional skilled PT intervention to improve strength, balance and gait safety;    Rehab Potential Good   Clinical Impairments Affecting Rehab Potential positive indicator: good PLOF, good response from prior therapy; negative: chronic condition, co-morbidities; Patient's clinical presentation is evolving as she is experiencing an exacerbation of impaired balance and weakness;    PT Frequency 2x / week   PT Duration 6 weeks   PT Treatment/Interventions Cryotherapy;Electrical Stimulation;Gait training;Moist Heat;Stair training;Functional mobility training;Therapeutic activities;Therapeutic exercise;Balance training;Neuromuscular re-education;Manual techniques;Patient/family education   PT Next Visit Plan work on balance/strengthening;    [place] continue as given;    Consulted and Agree with Plan of Care Patient      Patient will benefit from skilled therapeutic intervention in order to improve the following deficits and impairments:  Abnormal gait, Decreased mobility, Decreased coordination, Decreased activity tolerance, Decreased endurance, Decreased strength, Impaired flexibility, Difficulty walking, Decreased safety awareness, Decreased balance  Visit Diagnosis: Unsteadiness on feet - Plan: PT plan of care cert/re-cert  Muscle weakness (generalized) - Plan: PT plan of care cert/re-cert     Problem List Patient  Active Problem List   Diagnosis Date Noted  . Dyslipidemia 07/22/2016  . Mixed Alzheimer's and vascular dementia 05/25/2016  . Obesity (BMI 30.0-34.9) 05/25/2016  . Late effects of CVA (cerebrovascular accident) 05/05/2016  . Ataxia 04/20/2016  . Osteopenia 01/09/2016  . Arthritis 09/24/2015  . Airway hyperreactivity 09/24/2015  . Benign hypertension 09/24/2015  . Chronic kidney disease (CKD), stage III (moderate) 09/24/2015  . Colitis 09/24/2015  . Esophagitis, reflux 09/24/2015  . History of cardioembolic cerebrovascular accident (CVA) 09/24/2015  . Decreased potassium in the blood 09/24/2015  . History of malignant melanoma of skin 09/24/2015  . Obesity 09/24/2015  . Cerebrovascular disease 09/24/2015  . Hypercholesteremia 08/17/2015    Trotter,Margaret PT, DPT 08/10/2016, 3:52 PM  [place] MAIN [facility] SERVICES [address] [place], [place], [address] Phone: [phone]   Fax:  [phone]  Name: Atianna Haidar MRN: [id number] Date of Birth: 26-Oct-1932

## 2016-08-17 ENCOUNTER — Encounter: Payer: Medicare Other | Admitting: Physical Therapy

## 2016-08-24 ENCOUNTER — Encounter: Payer: Self-pay | Admitting: Physical Therapy

## 2016-08-24 ENCOUNTER — Ambulatory Visit: Payer: Medicare Other | Admitting: Physical Therapy

## 2016-08-24 DIAGNOSIS — M6281 Muscle weakness (generalized): Secondary | ICD-10-CM

## 2016-08-24 DIAGNOSIS — R279 Unspecified lack of coordination: Secondary | ICD-10-CM

## 2016-08-24 DIAGNOSIS — R2681 Unsteadiness on feet: Secondary | ICD-10-CM

## 2016-08-24 NOTE — Therapy (Signed)
[place] MAIN [facility] SERVICES [address] [place], [place], [address] Phone: [phone]   Fax:  [phone]  Physical Therapy Treatment  Patient Details  Name: Susan Campos MRN: [id number] Date of Birth: 08/04/1932 Referring Provider: Dr. Jennings Books  Encounter Date: 08/24/2016      PT End of Session - 08/24/16 1448    Visit Number 9   Number of Visits 20   Date for PT Re-Evaluation 09/07/16   Authorization Type gcode 9   Authorization Time Period 10   PT Start Time 1350   PT Stop Time 1430   PT Time Calculation (min) 40 min   Activity Tolerance Patient tolerated treatment well   Behavior During Therapy [facility] for tasks assessed/performed      Past Medical History:  Diagnosis Date  . Arthritis    HIPS  . Asthma    HX OF  . Cancer ([place])    skin  . Orthopnea    sleeps in recliner, cannot lay flat on her back  . Stroke [facility])    CVA 2012/ MEMORY LOSS, 1999, 1 more in 2011  . Vertigo    OCCASIONAL    Past Surgical History:  Procedure Laterality Date  . CATARACT EXTRACTION Right   . CATARACT EXTRACTION W/PHACO Left 05/27/2015   Procedure: CATARACT EXTRACTION PHACO AND INTRAOCULAR LENS PLACEMENT (IOC);  Surgeon: Leandrew Koyanagi, MD;  Location: [place];  Service: Ophthalmology;  Laterality: Left;  . KNEE ARTHROSCOPY    . MELANOMA EXCISION     from chest  . MOUTH SURGERY    . REPLACEMENT TOTAL KNEE BILATERAL    . UTERINE SUSPENSION      There were no vitals filed for this visit.      Subjective Assessment - 08/24/16 1447    Subjective Patient reports compliance with HEP; She reports doing well and that she is now able to open her water bottle by herself now.   Pertinent History personal factors affecting rehab: age, history of CVA, chronic condition, arthritis, CKD stage III, impaired memory   Limitations Walking;Other (comment)  balance, RUE stiffness, coordination;    Diagnostic tests MRI  shows multiple old small lacunar infarcts;    Patient Stated Goals Improve balance and endurance; improve RUE joint mobility for fine motor tasks;       TREATMENT: Leg press: BLE 90# 2x10 with cues to slow down eccentric return for better quad strengthening; BLE heel raises 90# 2x10  With min VCs for positioning to improve ankle strengthening;  Seated: RUE only: Wrist flexion/extension 2#  2x15 each RUE pronation/supination with 2# to improve RUE wrist flexibility  2x10 each direction; RUE holding yellow geometric handle with moving washer in multiple direction to improve wrist flexibility x3 min;    RUE digiflex x5 reps full hand squeeze, x10 each digit squeeze, yellow resistance;  Patient required min-moderate verbal/tactile cues for correct exercise technique.Required cues to avoid full UE movement but to isolate RUE finger movement.   Standing on airex: Modified tandem with balloon taps x2 min each foot in front;  Standing with feet apart: balloon taps x2 min with close supervision for balance; Patient able to demonstrate good reach with RUE for better challenge;  Sit<>Stand with 4# bar  overhead press x10 reps; Patient required min Vcs to increase forward lean for better sit<>stand ability;  PT Education - 08/24/16 1447    Education provided Yes   Education Details HEP advanced, strengthening   Person(s) Educated Patient   Methods Explanation;Verbal cues   Comprehension Verbalized understanding;Returned demonstration;Verbal cues required             PT Long Term Goals - 08/10/16 1311      PT LONG TERM GOAL #1   Title Patient will be independent in home exercise program to improve strength/mobility for better functional independence with ADLs.   Time 6   Period Weeks   Status On-going     PT LONG TERM GOAL #2   Title pt will improve berg balance score by 6 pts to reduce risk of falls .   Time 6   Period Weeks    Status Partially Met     PT LONG TERM GOAL #3   Title pt will reduce 5x sit to stand time to < 15 s demosntrating improved LE strength.    Time 6   Period Weeks   Status Achieved     PT LONG TERM GOAL #4   Title Patient will increase BUE and BLE gross strength to 4+/5 as to improve functional strength for independent gait, increased standing tolerance and increased ADL ability.   Time 6   Period Weeks   Status Partially Met     PT LONG TERM GOAL #5   Title  Patient will decrease Quick DASH score by > 8 points demonstrating reduced self-reported upper extremity disability.   Time 6   Period Weeks   Status Not Met               Plan - 08/24/16 1448    Clinical Impression Statement Patient instructed in advanced strengthening exercise. Advanced HEP with providing green tband for better resistance. Patient requires min Vcs for correct exercise technique; Patient able to demonstrate better balance with being able to stand on uneven surface unsupported with reaching outside base of support with less loss of balance; Patient does continue to have some weakness in right side of body; She would benefit from additional skilled PT [place] to improve strength, balance and gait safety;    Rehab Potential Good   Clinical Impairments Affecting Rehab Potential positive indicator: good PLOF, good response from prior therapy; negative: chronic condition, co-morbidities; Patient's clinical presentation is evolving as she is experiencing an exacerbation of impaired balance and weakness;    PT Frequency 2x / week   PT Duration 6 weeks   PT Treatment/Interventions Cryotherapy;Electrical Stimulation;Gait training;Moist Heat;Stair training;Functional mobility training;Therapeutic activities;Therapeutic exercise;Balance training;Neuromuscular re-education;Manual techniques;Patient/family education   PT Next Visit Plan work on balance/strengthening;    [place] continue as given;     Consulted and Agree with Plan of Care Patient      Patient will benefit from skilled therapeutic intervention in order to improve the following deficits and impairments:  Abnormal gait, Decreased mobility, Decreased coordination, Decreased activity tolerance, Decreased endurance, Decreased strength, Impaired flexibility, Difficulty walking, Decreased safety awareness, Decreased balance  Visit Diagnosis: Unsteadiness on feet  Muscle weakness (generalized)  Unspecified lack of coordination     Problem List Patient Active Problem List   Diagnosis Date Noted  . Dyslipidemia 07/22/2016  . Mixed Alzheimer's and vascular dementia 05/25/2016  . Obesity (BMI 30.0-34.9) 05/25/2016  . Late effects of CVA (cerebrovascular accident) 05/05/2016  . Ataxia 04/20/2016  . Osteopenia 01/09/2016  . Arthritis 09/24/2015  . Airway hyperreactivity 09/24/2015  . Benign hypertension 09/24/2015  .  Chronic kidney disease (CKD), stage III (moderate) 09/24/2015  . Colitis 09/24/2015  . Esophagitis, reflux 09/24/2015  . History of cardioembolic cerebrovascular accident (CVA) 09/24/2015  . Decreased potassium in the blood 09/24/2015  . History of malignant melanoma of skin 09/24/2015  . Obesity 09/24/2015  . Cerebrovascular disease 09/24/2015  . Hypercholesteremia 08/17/2015    Trotter,Margaret PT, DPT 08/24/2016, 2:55 PM  [place] MAIN [facility] SERVICES [address] [place], [place], [address] Phone: [phone]   Fax:  [phone]  Name: Adelaine Roppolo MRN: [id number] Date of Birth: [birth year]

## 2016-08-30 ENCOUNTER — Ambulatory Visit: Payer: Medicare Other | Admitting: Physical Therapy

## 2016-08-30 ENCOUNTER — Encounter: Payer: Self-pay | Admitting: Physical Therapy

## 2016-08-30 DIAGNOSIS — R2681 Unsteadiness on feet: Secondary | ICD-10-CM

## 2016-08-30 DIAGNOSIS — M6281 Muscle weakness (generalized): Secondary | ICD-10-CM

## 2016-08-30 DIAGNOSIS — R279 Unspecified lack of coordination: Secondary | ICD-10-CM

## 2016-08-30 NOTE — Therapy (Signed)
[place] MAIN [facility] SERVICES [address] [place], [place], [address] Phone: [phone]   Fax:  [phone]  Physical Therapy Treatment  Patient Details  Name: Susan Campos MRN: [id number] Date of Birth: 1933/03/17 Referring Provider: Dr. Jennings Books  Encounter Date: 08/30/2016      PT End of Session - 08/30/16 1311    Visit Number 10   Number of Visits 20   Date for PT Re-Evaluation 09/07/16   Authorization Type gcode 10   Authorization Time Period 10   PT Start Time 1300   PT Stop Time 1345   PT Time Calculation (min) 45 min   Equipment Utilized During Treatment Gait belt   Activity Tolerance Patient tolerated treatment well   Behavior During Therapy [facility] for tasks assessed/performed      Past Medical History:  Diagnosis Date  . Arthritis    HIPS  . Asthma    HX OF  . Cancer ([place])    skin  . Orthopnea    sleeps in recliner, cannot lay flat on her back  . Stroke [facility])    CVA 2012/ MEMORY LOSS, 1999, 1 more in 2011  . Vertigo    OCCASIONAL    Past Surgical History:  Procedure Laterality Date  . CATARACT EXTRACTION Right   . CATARACT EXTRACTION W/PHACO Left 05/27/2015   Procedure: CATARACT EXTRACTION PHACO AND INTRAOCULAR LENS PLACEMENT (IOC);  Surgeon: Leandrew Koyanagi, MD;  Location: [place];  Service: Ophthalmology;  Laterality: Left;  . KNEE ARTHROSCOPY    . MELANOMA EXCISION     from chest  . MOUTH SURGERY    . REPLACEMENT TOTAL KNEE BILATERAL    . UTERINE SUSPENSION      There were no vitals filed for this visit.      Subjective Assessment - 08/30/16 1307    Subjective Patient reports doing well today; She denies any pain; Reports compliance with HEP and reports that her right hand is getting better;    Pertinent History personal factors affecting rehab: age, history of CVA, chronic condition, arthritis, CKD stage III, impaired memory   Limitations Walking;Other (comment)   balance, RUE stiffness, coordination;    Diagnostic tests MRI shows multiple old small lacunar infarcts;    Patient Stated Goals Improve balance and endurance; improve RUE joint mobility for fine motor tasks;    Currently in Pain? No/denies        TREATMENT: Leg press: BLE 90# 2x12 with cues to slow down eccentric return for better quad strengthening; BLE heel raises 90# 2x12  With min VCs for positioning to improve ankle strengthening;  Seated: RUE only: Wrist flexion/extension 2#  x15 each RUE pronation/supination with 2# to improve RUE wrist flexibility x15 each direction; RUE holding yellow geometric handle with moving washer in multiple direction to improve wrist flexibility x3 min;  RUE 3 finger clothes pins green, blue and black #4 each x2 with cues for 3 finger grasp to improve pinch grip; RUE Velcro wrist pronation/supination with large roll with large/small tip x2 reps each and small roll x2 reps each;   Standing on airex: Feet together BUE wand 4# flexion x10 with CGA for safety and cues to improve UE ROM for better shoulder strenghtening; Feet together: BUE ball pass side/side x10 each with cues to improve trunk rotation for better balance challenge; Standing with feet apart, alternate toe taps on 4 inch step without rail assist x10 bilaterally with min VCs to step all the way  back for better stance control on airex pad.  Resisted walking 4 way, forward/backward, side/side 12.5# x2 laps each with min A for balance and mod Vcs to improve weight shift and slow down eccentric return for better balance control;                          PT Education - 08/30/16 1309    Education provided Yes   Education Details HEP reinforced, strengthening;    Person(s) Educated Patient   Methods Explanation;Verbal cues   Comprehension Verbalized understanding;Returned demonstration;Verbal cues required             PT Long Term Goals - 08/10/16 1311       PT LONG TERM GOAL #1   Title Patient will be independent in home exercise program to improve strength/mobility for better functional independence with ADLs.   Time 6   Period Weeks   Status On-going     PT LONG TERM GOAL #2   Title pt will improve berg balance score by 6 pts to reduce risk of falls .   Time 6   Period Weeks   Status Partially Met     PT LONG TERM GOAL #3   Title pt will reduce 5x sit to stand time to < 15 s demosntrating improved LE strength.    Time 6   Period Weeks   Status Achieved     PT LONG TERM GOAL #4   Title Patient will increase BUE and BLE gross strength to 4+/5 as to improve functional strength for independent gait, increased standing tolerance and increased ADL ability.   Time 6   Period Weeks   Status Partially Met     PT LONG TERM GOAL #5   Title  Patient will decrease Quick DASH score by > 8 points demonstrating reduced self-reported upper extremity disability.   Time 6   Period Weeks   Status Not Met               Plan - 08/30/16 1357    Clinical Impression Statement Patient instructed in advanced strengthening/balance exercise. She required min VCs for correct exercise technique; Patient requires CGA to min A with advanced balance exercise. She fatigued quickly with resisted walking and required increased cues for weight shift to improve eccentric gait control. Patient able to demonstrate better RUE wrist movement with weight and ROM exercise. She would benefit from additional skilled PT intervention to improve strength, balance and gait safety;    Rehab Potential Good   Clinical Impairments Affecting Rehab Potential positive indicator: good PLOF, good response from prior therapy; negative: chronic condition, co-morbidities; Patient's clinical presentation is evolving as she is experiencing an exacerbation of impaired balance and weakness;    PT Frequency 2x / week   PT Duration 6 weeks   PT Treatment/Interventions Cryotherapy;Electrical  Stimulation;Gait training;Moist Heat;Stair training;Functional mobility training;Therapeutic activities;Therapeutic exercise;Balance training;Neuromuscular re-education;Manual techniques;Patient/family education   PT Next Visit Plan work on balance/strengthening;    [place] continue as given;    Consulted and Agree with Plan of Care Patient      Patient will benefit from skilled therapeutic intervention in order to improve the following deficits and impairments:  Abnormal gait, Decreased mobility, Decreased coordination, Decreased activity tolerance, Decreased endurance, Decreased strength, Impaired flexibility, Difficulty walking, Decreased safety awareness, Decreased balance  Visit Diagnosis: Unsteadiness on feet  Muscle weakness (generalized)  Unspecified lack of coordination  G-Codes - 08/30/16 1310    Functional Assessment Tool Used clinical judgement, 10 meter walk, 5 times sit<>Stand, strength   Functional Limitation Mobility: Walking and moving around   Mobility: Walking and Moving Around Current Status [phone]) At least 20 percent but less than 40 percent impaired, limited or restricted   Mobility: Walking and Moving Around Goal Status [phone]) At least 1 percent but less than 20 percent impaired, limited or restricted      Problem List Patient Active Problem List   Diagnosis Date Noted  . Dyslipidemia 07/22/2016  . Mixed Alzheimer's and vascular dementia 05/25/2016  . Obesity (BMI 30.0-34.9) 05/25/2016  . Late effects of CVA (cerebrovascular accident) 05/05/2016  . Ataxia 04/20/2016  . Osteopenia 01/09/2016  . Arthritis 09/24/2015  . Airway hyperreactivity 09/24/2015  . Benign hypertension 09/24/2015  . Chronic kidney disease (CKD), stage III (moderate) 09/24/2015  . Colitis 09/24/2015  . Esophagitis, reflux 09/24/2015  . History of cardioembolic cerebrovascular accident (CVA) 09/24/2015  . Decreased potassium in the blood 09/24/2015  . History  of malignant melanoma of skin 09/24/2015  . Obesity 09/24/2015  . Cerebrovascular disease 09/24/2015  . Hypercholesteremia 08/17/2015    Trotter,Margaret PT, DPT 08/30/2016, 1:59 PM  [place] MAIN [facility] SERVICES [address] [place], [place], [address] Phone: [phone]   Fax:  [phone]  Name: Marquitta Persichetti MRN: [id number] Date of Birth: 1933/06/04

## 2016-09-01 ENCOUNTER — Encounter: Payer: Medicare Other | Admitting: Physical Therapy

## 2016-09-01 ENCOUNTER — Encounter: Payer: Self-pay | Admitting: Physical Therapy

## 2016-09-01 ENCOUNTER — Ambulatory Visit: Payer: Medicare Other | Attending: Neurology | Admitting: Physical Therapy

## 2016-09-01 DIAGNOSIS — M6281 Muscle weakness (generalized): Secondary | ICD-10-CM | POA: Insufficient documentation

## 2016-09-01 DIAGNOSIS — R2681 Unsteadiness on feet: Secondary | ICD-10-CM | POA: Insufficient documentation

## 2016-09-01 DIAGNOSIS — R279 Unspecified lack of coordination: Secondary | ICD-10-CM | POA: Insufficient documentation

## 2016-09-01 NOTE — Therapy (Signed)
[place] MAIN [facility] SERVICES [address] [place], [place], [address] Phone: [phone]   Fax:  [phone]  Physical Therapy Treatment  Patient Details  Name: Susan Campos MRN: [id number] Date of Birth: 17-Feb-1933 Referring Provider: Dr. Jennings Books  Encounter Date: 09/01/2016      PT End of Session - 09/01/16 1446    Visit Number 11   Number of Visits 20   Date for PT Re-Evaluation 09/07/16   Authorization Type gcode 1   Authorization Time Period 10   PT Start Time 1435   PT Stop Time 1515   PT Time Calculation (min) 40 min   Equipment Utilized During Treatment Gait belt   Activity Tolerance Patient limited by fatigue;Patient limited by lethargy   Behavior During Therapy Flat affect      Past Medical History:  Diagnosis Date  . Arthritis    HIPS  . Asthma    HX OF  . Cancer ([place])    skin  . Orthopnea    sleeps in recliner, cannot lay flat on her back  . Stroke [facility])    CVA 2012/ MEMORY LOSS, 1999, 1 more in 2011  . Vertigo    OCCASIONAL    Past Surgical History:  Procedure Laterality Date  . CATARACT EXTRACTION Right   . CATARACT EXTRACTION W/PHACO Left 05/27/2015   Procedure: CATARACT EXTRACTION PHACO AND INTRAOCULAR LENS PLACEMENT (IOC);  Surgeon: Leandrew Koyanagi, MD;  Location: [facility];  Service: Ophthalmology;  Laterality: Left;  . KNEE ARTHROSCOPY    . MELANOMA EXCISION     from chest  . MOUTH SURGERY    . REPLACEMENT TOTAL KNEE BILATERAL    . UTERINE SUSPENSION      There were no vitals filed for this visit.      Subjective Assessment - 09/01/16 1445    Subjective Patient reports doing well; she reports having a good class and states that she is doing well; Denies any pain;    Pertinent History personal factors affecting rehab: age, history of CVA, chronic condition, arthritis, CKD stage III, impaired memory   Limitations Walking;Other (comment)  balance, RUE stiffness,  coordination;    Diagnostic tests MRI shows multiple old small lacunar infarcts;    Patient Stated Goals Improve balance and endurance; improve RUE joint mobility for fine motor tasks;    Currently in Pain? No/denies         TREATMENT: Warm up on Nustep BUE/BLE level 2 x4 min (unbilled);  Leg press: BLE 90# 2x12 with cues to slow down eccentric return for better quad strengthening; BLE heel raises 90# 2x12 With min VCs for positioning to improve ankle strengthening; Patient reports slight soreness in calf muscles due to fatigue;   Seated: RUE only: Programme researcher, broadcasting/film/video with yellow spring at 20# level, pegs x16 x2 sets each; Required min Vcs to increase grip for better ease of peg movement;  RUE holding yellow geometric handle with moving washer in multiple direction to improve wrist flexibility x3 min;  Grooved pegboard x1 set with cues to improve finger manipulation of pegs for better accuracy and coordination;  Sit<>Stand with 3# bar overhead press x10 reps with cues for forward lean to improve transfer ability;  Forward step ups on 4 inch step with 1 rail assist x10 each LE with patient demonstrating increased foot drag when descending requiring min Vcs to increase hip flexion for better foot clearance;  Patient expressed fatigue today and also expressed concern over her  dying daughter in law which limited her participation in session;                       PT Education - 09/01/16 1446    Education provided Yes   Education Details HEP reinforced, strengthening/balance;    Person(s) Educated Patient   Methods Explanation;Verbal cues   Comprehension Verbalized understanding;Returned demonstration;Verbal cues required             PT Long Term Goals - 08/10/16 1311      PT LONG TERM GOAL #1   Title Patient will be independent in home exercise program to improve strength/mobility for better functional independence with ADLs.   Time 6   Period Weeks    Status On-going     PT LONG TERM GOAL #2   Title pt will improve berg balance score by 6 pts to reduce risk of falls .   Time 6   Period Weeks   Status Partially Met     PT LONG TERM GOAL #3   Title pt will reduce 5x sit to stand time to < 15 s demosntrating improved LE strength.    Time 6   Period Weeks   Status Achieved     PT LONG TERM GOAL #4   Title Patient will increase BUE and BLE gross strength to 4+/5 as to improve functional strength for independent gait, increased standing tolerance and increased ADL ability.   Time 6   Period Weeks   Status Partially Met     PT LONG TERM GOAL #5   Title  Patient will decrease Quick DASH score by > 8 points demonstrating reduced self-reported upper extremity disability.   Time 6   Period Weeks   Status Not Met               Plan - 09/01/16 1531    Clinical Impression Statement Patient instructed in advanced strengthening exercise. She was heavily fatigued today with having a class and having her hair done earlier this morning. Patient required increased rest breaks and increased verbal cues for correct exercise technique. She would benefit from additional skilled PT intervention to improve balance/gait safety and reduce fall risk;    Rehab Potential Good   Clinical Impairments Affecting Rehab Potential positive indicator: good PLOF, good response from prior therapy; negative: chronic condition, co-morbidities; Patient's clinical presentation is evolving as she is experiencing an exacerbation of impaired balance and weakness;    PT Frequency 2x / week   PT Duration 6 weeks   PT Treatment/Interventions Cryotherapy;Electrical Stimulation;Gait training;Moist Heat;Stair training;Functional mobility training;Therapeutic activities;Therapeutic exercise;Balance training;Neuromuscular re-education;Manual techniques;Patient/family education   PT Next Visit Plan work on balance/strengthening;    [place] continue as given;     Consulted and Agree with Plan of Care Patient      Patient will benefit from skilled therapeutic intervention in order to improve the following deficits and impairments:  Abnormal gait, Decreased mobility, Decreased coordination, Decreased activity tolerance, Decreased endurance, Decreased strength, Impaired flexibility, Difficulty walking, Decreased safety awareness, Decreased balance  Visit Diagnosis: Unsteadiness on feet  Muscle weakness (generalized)  Unspecified lack of coordination     Problem List Patient Active Problem List   Diagnosis Date Noted  . Dyslipidemia 07/22/2016  . Mixed Alzheimer's and vascular dementia 05/25/2016  . Obesity (BMI 30.0-34.9) 05/25/2016  . Late effects of CVA (cerebrovascular accident) 05/05/2016  . Ataxia 04/20/2016  . Osteopenia 01/09/2016  . Arthritis 09/24/2015  . Airway  hyperreactivity 09/24/2015  . Benign hypertension 09/24/2015  . Chronic kidney disease (CKD), stage III (moderate) 09/24/2015  . Colitis 09/24/2015  . Esophagitis, reflux 09/24/2015  . History of cardioembolic cerebrovascular accident (CVA) 09/24/2015  . Decreased potassium in the blood 09/24/2015  . History of malignant melanoma of skin 09/24/2015  . Obesity 09/24/2015  . Cerebrovascular disease 09/24/2015  . Hypercholesteremia 08/17/2015    Arney Mayabb PT, DPT 09/01/2016, 3:33 PM  [place] MAIN [facility] SERVICES [address] [place], [place], [address] Phone: [phone]   Fax:  [phone]  Name: Susan Campos MRN: [id number] Date of Birth: [birth year]

## 2016-09-07 ENCOUNTER — Ambulatory Visit: Payer: Medicare Other | Admitting: Physical Therapy

## 2016-09-07 ENCOUNTER — Encounter: Payer: Self-pay | Admitting: Physical Therapy

## 2016-09-07 DIAGNOSIS — R2681 Unsteadiness on feet: Secondary | ICD-10-CM | POA: Diagnosis not present

## 2016-09-07 DIAGNOSIS — M6281 Muscle weakness (generalized): Secondary | ICD-10-CM

## 2016-09-07 DIAGNOSIS — R279 Unspecified lack of coordination: Secondary | ICD-10-CM

## 2016-09-07 NOTE — Patient Instructions (Signed)
  Backward Walking   Walk backward, toes of each foot coming down first. Take long, even strides. Make sure you have a clear pathway with no obstructions when you do this. Stand beside counter and walk backward  And then walk forward doing opposite directions; repeat 10 laps 2x a day at least 5 days a week.  Copyright  VHI. All rights reserved.  Tandem Walking   Stand beside kitchen sink and place one foot in front of the other, lift your hand and try to hold position for 10 sec. Repeat with other foot in front; Repeat 5 reps with each foot in front 5 days a week.Balance: Unilateral   Attempt to balance on left leg, eyes open. Hold _5-10___ seconds.Start with holding onto counter and if you get your balance you can try to let go of counter. Repeat __5__ times per set. Do __1__ sets per session. Do __1__ sessions per day. Keep eyes open:   http://orth.exer.us/29   Copyright  VHI. All rights reserved.      

## 2016-09-07 NOTE — Therapy (Signed)
[place] MAIN [facility] SERVICES [address] [place], [place], [address] Phone: [phone]   Fax:  [phone]  Physical Therapy Treatment  Patient Details  Name: Susan Campos MRN: [id number] Date of Birth: [birth year] Referring Provider: Dr. Jennings Books  Encounter Date: 09/07/2016      PT End of Session - 09/07/16 0958    Visit Number 12   Number of Visits 28   Date for PT Re-Evaluation 10/05/16   Authorization Type gcode 2   Authorization Time Period 10   PT Start Time 0945   PT Stop Time 1040   PT Time Calculation (min) 55 min   Equipment Utilized During Treatment Gait belt   Activity Tolerance No increased pain;Patient tolerated treatment well   Behavior During Therapy [facility] for tasks assessed/performed      Past Medical History:  Diagnosis Date  . Arthritis    HIPS  . Asthma    HX OF  . Cancer ([place])    skin  . Orthopnea    sleeps in recliner, cannot lay flat on her back  . Stroke [facility])    CVA 2012/ MEMORY LOSS, 1999, 1 more in 2011  . Vertigo    OCCASIONAL    Past Surgical History:  Procedure Laterality Date  . CATARACT EXTRACTION Right   . CATARACT EXTRACTION W/PHACO Left 05/27/2015   Procedure: CATARACT EXTRACTION PHACO AND INTRAOCULAR LENS PLACEMENT (IOC);  Surgeon: Leandrew Koyanagi, MD;  Location: [facility];  Service: Ophthalmology;  Laterality: Left;  . KNEE ARTHROSCOPY    . MELANOMA EXCISION     from chest  . MOUTH SURGERY    . REPLACEMENT TOTAL KNEE BILATERAL    . UTERINE SUSPENSION      There were no vitals filed for this visit.      Subjective Assessment - 09/07/16 0957    Subjective Patient reports feeling soreness in her knees this weekend with colder weather. She reports doing better today;    Pertinent History personal factors affecting rehab: age, history of CVA, chronic condition, arthritis, CKD stage III, impaired memory   Limitations Walking;Other (comment)  balance, RUE  stiffness, coordination;    Diagnostic tests MRI shows multiple old small lacunar infarcts;    Patient Stated Goals Improve balance and endurance; improve RUE joint mobility for fine motor tasks;    Currently in Pain? No/denies            [facility] PT Assessment - 09/07/16 0001      Observation/Other Assessments   Quick DASH  20% (the lower the score the less disability); improved from initial eval on 06/29/16 which was 34%     Strength   Right Hand Grip (lbs) 45   Left Hand Grip (lbs) 40     Standardized Balance Assessment   Five times sit to stand comments  11.5 sec without pushing on chair, low fall risk; improved from initial eval on 06/29/16 which was 18.6 sec   10 Meter Walk 1.0 m/s  community ambulator speed (improved from 06/29/16 which was 0.833 m/s)     Berg Balance Test   Sit to Stand Able to stand without using hands and stabilize independently   Standing Unsupported Able to stand safely 2 minutes   Sitting with Back Unsupported but Feet Supported on Floor or Stool Able to sit safely and securely 2 minutes   Stand to Sit Sits safely with minimal use of hands   Transfers Able to transfer safely, minor use  of hands   Standing Unsupported with Eyes Closed Able to stand 10 seconds safely   Standing Ubsupported with Feet Together Able to place feet together independently and stand 1 minute safely   From Standing, Reach Forward with Outstretched Arm Can reach confidently >25 cm (10")   From Standing Position, Pick up Object from Floor Able to pick up shoe safely and easily   From Standing Position, Turn to Look Behind Over each Shoulder Looks behind from both sides and weight shifts well   Turn 360 Degrees Able to turn 360 degrees safely in 4 seconds or less   Standing Unsupported, Alternately Place Feet on Step/Stool Able to stand independently and safely and complete 8 steps in 20 seconds   Standing Unsupported, One Foot in Front Able to plae foot ahead of the other  independently and hold 30 seconds   Standing on One Leg Tries to lift leg/unable to hold 3 seconds but remains standing independently   Total Score 52   Berg comment: less than 50% fall risk; improved from 06/29/16 which was 48/56      TREATMENT: Warm up on Nustep BUE/BLE level 2 x4 min (unbilled);  Instructed patient in 10 meter walk, 5 times sit<>Stand, berg balance assessment, and quick dash to assess progress towards goals; see above;    Seated: RUE only: Programme researcher, broadcasting/film/video with yellow spring at 20# and 25# level, pegs x16 x2 sets each; Required min Vcs to increase grip for better ease of peg movement;   Leg press, BLE plate 90# 2I94 with cues to slow down LE movement for better strengthening; Leg press, BLE plate 90# heel raises 2x15 with cues for positioning for optimum strengthening;  Advanced HEP with balance exercise: Tandem stance with 1-0 rail assist 10 sec hold x3 each foot in front; SLS on firm surface with 1 rail assist 10 sec hold x2 each LE; Forward/backward walking without rail assist, 10 feet x 3 laps each with cues to increase step length when walking backwards for better balance challenge; Patient required min VCs for balance stability, including to increase trunk control for less loss of balance with smaller base of support                         PT Education - 09/07/16 0958    Education provided Yes   Education Details HEP advanced, strengthening, balance;    Person(s) Educated Patient   Methods Explanation;Verbal cues   Comprehension Returned demonstration;Verbalized understanding;Verbal cues required             PT Long Term Goals - 09/07/16 1011      PT LONG TERM GOAL #1   Title Patient will be independent in home exercise program to improve strength/mobility for better functional independence with ADLs.   Time 4   Period Weeks   Status On-going     PT LONG TERM GOAL #2   Title pt will improve berg balance score by 6 pts to  reduce risk of falls .   Time 4   Period Weeks   Status Partially Met     PT LONG TERM GOAL #3   Title pt will reduce 5x sit to stand time to < 15 s demosntrating improved LE strength.    Time 4   Period Weeks   Status Achieved     PT LONG TERM GOAL #4   Title Patient will increase BUE and BLE gross strength to 4+/5 as to improve  functional strength for independent gait, increased standing tolerance and increased ADL ability.   Time 4   Period Weeks   Status Partially Met     PT LONG TERM GOAL #5   Title  Patient will decrease Quick DASH score by > 8 points demonstrating reduced self-reported upper extremity disability.   Time 4   Period Weeks   Status Achieved               Plan - 09/07/16 1455    Clinical Impression Statement Instructed patient in outcome measures to assess progress towards goals. She demonstrates significant improvement in gait and transfer ability. Patient continues to have weakness in hand with difficulty with some ADLs as evidenced with quick dash. In addition she still has some weakness in RLE and difficulty with advanced balance tasks. She would benefit from skilled PT intervention to improve balance/gait safety and reduce fall risk;    Rehab Potential Good   Clinical Impairments Affecting Rehab Potential positive indicator: good PLOF, good response from prior therapy; negative: chronic condition, co-morbidities; Patient's clinical presentation is evolving as she is experiencing an exacerbation of impaired balance and weakness;    PT Frequency 2x / week   PT Duration 4 weeks   PT Treatment/Interventions Cryotherapy;Electrical Stimulation;Gait training;Moist Heat;Stair training;Functional mobility training;Therapeutic activities;Therapeutic exercise;Balance training;Neuromuscular re-education;Manual techniques;Patient/family education   PT Next Visit Plan work on balance/strengthening;    [place] continue as given;    Consulted and Agree  with Plan of Care Patient      Patient will benefit from skilled therapeutic intervention in order to improve the following deficits and impairments:  Abnormal gait, Decreased mobility, Decreased coordination, Decreased activity tolerance, Decreased endurance, Decreased strength, Impaired flexibility, Difficulty walking, Decreased safety awareness, Decreased balance  Visit Diagnosis: Unsteadiness on feet - Plan: PT plan of care cert/re-cert  Muscle weakness (generalized) - Plan: PT plan of care cert/re-cert  Unspecified lack of coordination - Plan: PT plan of care cert/re-cert     Problem List Patient Active Problem List   Diagnosis Date Noted  . Dyslipidemia 07/22/2016  . Mixed Alzheimer's and vascular dementia 05/25/2016  . Obesity (BMI 30.0-34.9) 05/25/2016  . Late effects of CVA (cerebrovascular accident) 05/05/2016  . Ataxia 04/20/2016  . Osteopenia 01/09/2016  . Arthritis 09/24/2015  . Airway hyperreactivity 09/24/2015  . Benign hypertension 09/24/2015  . Chronic kidney disease (CKD), stage III (moderate) 09/24/2015  . Colitis 09/24/2015  . Esophagitis, reflux 09/24/2015  . History of cardioembolic cerebrovascular accident (CVA) 09/24/2015  . Decreased potassium in the blood 09/24/2015  . History of malignant melanoma of skin 09/24/2015  . Obesity 09/24/2015  . Cerebrovascular disease 09/24/2015  . Hypercholesteremia 08/17/2015    Shereece Wellborn PT, DPT 09/07/2016, 3:04 PM  [place] MAIN [facility] SERVICES [address] [place], [place], [address] Phone: [phone]   Fax:  [phone]  Name: Susan Campos MRN: [id number] Date of Birth: 12/03/1932

## 2016-09-14 ENCOUNTER — Encounter: Payer: Self-pay | Admitting: Physical Therapy

## 2016-09-14 ENCOUNTER — Ambulatory Visit: Payer: Medicare Other | Admitting: Physical Therapy

## 2016-09-14 DIAGNOSIS — R2681 Unsteadiness on feet: Secondary | ICD-10-CM

## 2016-09-14 DIAGNOSIS — M6281 Muscle weakness (generalized): Secondary | ICD-10-CM

## 2016-09-14 DIAGNOSIS — R279 Unspecified lack of coordination: Secondary | ICD-10-CM

## 2016-09-14 NOTE — Therapy (Signed)
[place] MAIN [facility] SERVICES [address] [place], [place], [address] Phone: [phone]   Fax:  [phone]  Physical Therapy Treatment  Patient Details  Name: Susan Campos MRN: [id number] Date of Birth: 1932-11-01 Referring Provider: Dr. Jennings Books  Encounter Date: 09/14/2016      PT End of Session - 09/14/16 1109    Visit Number 13   Number of Visits 28   Date for PT Re-Evaluation 10/05/16   Authorization Type gcode [id number]   Authorization Time Period 10   PT Start Time 1100   PT Stop Time 1145   PT Time Calculation (min) 45 min   Equipment Utilized During Treatment Gait belt   Activity Tolerance No increased pain;Patient tolerated treatment well   Behavior During Therapy [facility] for tasks assessed/performed      Past Medical History:  Diagnosis Date  . Arthritis    HIPS  . Asthma    HX OF  . Cancer ([place])    skin  . Orthopnea    sleeps in recliner, cannot lay flat on her back  . Stroke [facility])    CVA 2012/ MEMORY LOSS, 1999, 1 more in 2011  . Vertigo    OCCASIONAL    Past Surgical History:  Procedure Laterality Date  . CATARACT EXTRACTION Right   . CATARACT EXTRACTION W/PHACO Left 05/27/2015   Procedure: CATARACT EXTRACTION PHACO AND INTRAOCULAR LENS PLACEMENT (IOC);  Surgeon: Leandrew Koyanagi, MD;  Location: [place];  Service: Ophthalmology;  Laterality: Left;  . KNEE ARTHROSCOPY    . MELANOMA EXCISION     from chest  . MOUTH SURGERY    . REPLACEMENT TOTAL KNEE BILATERAL    . UTERINE SUSPENSION      There were no vitals filed for this visit.      Subjective Assessment - 09/14/16 1107    Subjective Patient reports being more fatigued this morning. She repots not sleeping well due to her daughter in law having trouble breathing. Patient is experiencing stress due to her daughter in laws illness and her son's stress.    Pertinent History personal factors affecting rehab: age, history of CVA,  chronic condition, arthritis, CKD stage III, impaired memory   Limitations Walking;Other (comment)  balance, RUE stiffness, coordination;    Diagnostic tests MRI shows multiple old small lacunar infarcts;    Patient Stated Goals Improve balance and endurance; improve RUE joint mobility for fine motor tasks;    Currently in Pain? No/denies         TREATMENT: Warm up on Nustep BUE/BLE level 2 x4 min (unbilled);   Seated: RUE only: Yellow geometric wrist movement with washer x3 min with cues to keep elbow close to side for better wrist movement;  Standing on airex: Heel/toe raises x15 without rail assist with cues to increase ankle movement; Alternate toe taps to 4 inch step x15 bilaterally without rail assist with min A for balance; Standing one foot on airex and one foot on step with BUE ball pass x10 each direction with each foot on step, CGA for safety;  Sit<>stand with 3# bar overhead lift x10; Patient reports slight fatigue following sit to stand.   Forward/backward walking on even surface x150 feet; Backward walking with head turns x50 feet with CGA for safety; Patient demonstrates slower gait speed with backwards walking due to unsteadiness requiring CGA especially with head turns/distractions;  PT Education - 09/14/16 1108    Education provided Yes   Education Details HEP reinforced, strengthening, balance;    Person(s) Educated Patient   Methods Explanation;Verbal cues   Comprehension Verbalized understanding;Returned demonstration;Verbal cues required             PT Long Term Goals - 09/07/16 1011      PT LONG TERM GOAL #1   Title Patient will be independent in home exercise program to improve strength/mobility for better functional independence with ADLs.   Time 4   Period Weeks   Status On-going     PT LONG TERM GOAL #2   Title pt will improve berg balance score by 6 pts to reduce risk of falls .   Time 4    Period Weeks   Status Partially Met     PT LONG TERM GOAL #3   Title pt will reduce 5x sit to stand time to < 15 s demosntrating improved LE strength.    Time 4   Period Weeks   Status Achieved     PT LONG TERM GOAL #4   Title Patient will increase BUE and BLE gross strength to 4+/5 as to improve functional strength for independent gait, increased standing tolerance and increased ADL ability.   Time 4   Period Weeks   Status Partially Met     PT LONG TERM GOAL #5   Title  Patient will decrease Quick DASH score by > 8 points demonstrating reduced self-reported upper extremity disability.   Time 4   Period Weeks   Status Achieved               Plan - 09/14/16 1303    Clinical Impression Statement Patient more distracted today due to concern over daughter in [place] disposition. Patient instructed in advanced balance exercise. She was able to progress balance exercise with less rail assist. She did fatigue with prolonged walking. Patient would benefit from additional skilled PT intervention to improve balance/gait safety and reduce fall risk;    Rehab Potential Good   Clinical Impairments Affecting Rehab Potential positive indicator: good PLOF, good response from prior therapy; negative: chronic condition, co-morbidities; Patient's clinical presentation is evolving as she is experiencing an exacerbation of impaired balance and weakness;    PT Frequency 2x / week   PT Duration 4 weeks   PT Treatment/Interventions Cryotherapy;Electrical Stimulation;Gait training;Moist Heat;Stair training;Functional mobility training;Therapeutic activities;Therapeutic exercise;Balance training;Neuromuscular re-education;Manual techniques;Patient/family education   PT Next Visit Plan work on balance/strengthening;    [place] continue as given;    Consulted and Agree with Plan of Care Patient      Patient will benefit from skilled therapeutic intervention in order to improve the  following deficits and impairments:  Abnormal gait, Decreased mobility, Decreased coordination, Decreased activity tolerance, Decreased endurance, Decreased strength, Impaired flexibility, Difficulty walking, Decreased safety awareness, Decreased balance  Visit Diagnosis: Unsteadiness on feet  Muscle weakness (generalized)  Unspecified lack of coordination     Problem List Patient Active Problem List   Diagnosis Date Noted  . Dyslipidemia 07/22/2016  . Mixed Alzheimer's and vascular dementia 05/25/2016  . Obesity (BMI 30.0-34.9) 05/25/2016  . Late effects of CVA (cerebrovascular accident) 05/05/2016  . Ataxia 04/20/2016  . Osteopenia 01/09/2016  . Arthritis 09/24/2015  . Airway hyperreactivity 09/24/2015  . Benign hypertension 09/24/2015  . Chronic kidney disease (CKD), stage III (moderate) 09/24/2015  . Colitis 09/24/2015  . Esophagitis, reflux 09/24/2015  . History of cardioembolic cerebrovascular accident (  CVA) 09/24/2015  . Decreased potassium in the blood 09/24/2015  . History of malignant melanoma of skin 09/24/2015  . Obesity 09/24/2015  . Cerebrovascular disease 09/24/2015  . Hypercholesteremia 08/17/2015    Trotter,Margaret PT, DPT 09/14/2016, 1:05 PM  [place] MAIN [facility] SERVICES [address] [place], [place], [address] Phone: [phone]   Fax:  [phone]  Name: Susan Campos MRN: [id number] Date of Birth: 1932/09/17

## 2016-09-22 ENCOUNTER — Encounter: Payer: Medicare Other | Admitting: Physical Therapy

## 2016-09-28 ENCOUNTER — Encounter: Payer: Self-pay | Admitting: Physical Therapy

## 2016-09-28 ENCOUNTER — Ambulatory Visit: Payer: Medicare Other | Admitting: Physical Therapy

## 2016-09-28 DIAGNOSIS — R279 Unspecified lack of coordination: Secondary | ICD-10-CM

## 2016-09-28 DIAGNOSIS — R2681 Unsteadiness on feet: Secondary | ICD-10-CM | POA: Diagnosis not present

## 2016-09-28 DIAGNOSIS — M6281 Muscle weakness (generalized): Secondary | ICD-10-CM

## 2016-09-28 NOTE — Therapy (Signed)
[place] MAIN [facility] SERVICES [address] [place], [place], [address] Phone: [phone]   Fax:  [phone]  Physical Therapy Treatment  Patient Details  Name: Susan Campos MRN: [id number] Date of Birth: 05/29/1933 Referring Provider: Dr. Jennings Books  Encounter Date: 09/28/2016      PT End of Session - 09/28/16 1503    Visit Number 14   Number of Visits 28   Date for PT Re-Evaluation 10/05/16   Authorization Type gcode 4   Authorization Time Period 10   PT Start Time 1100   PT Stop Time 1145   PT Time Calculation (min) 45 min   Equipment Utilized During Treatment Gait belt   Activity Tolerance No increased pain;Patient tolerated treatment well   Behavior During Therapy [facility] for tasks assessed/performed      Past Medical History:  Diagnosis Date  . Arthritis    HIPS  . Asthma    HX OF  . Cancer ([place])    skin  . Orthopnea    sleeps in recliner, cannot lay flat on her back  . Stroke [facility])    CVA 2012/ MEMORY LOSS, 1999, 1 more in 2011  . Vertigo    OCCASIONAL    Past Surgical History:  Procedure Laterality Date  . CATARACT EXTRACTION Right   . CATARACT EXTRACTION W/PHACO Left 05/27/2015   Procedure: CATARACT EXTRACTION PHACO AND INTRAOCULAR LENS PLACEMENT (IOC);  Surgeon: Leandrew Koyanagi, MD;  Location: [place];  Service: Ophthalmology;  Laterality: Left;  . KNEE ARTHROSCOPY    . MELANOMA EXCISION     from chest  . MOUTH SURGERY    . REPLACEMENT TOTAL KNEE BILATERAL    . UTERINE SUSPENSION      There were no vitals filed for this visit.      Subjective Assessment - 09/28/16 1453    Subjective Patient reports that her daughter in law passed last week; She has been excessively fatigued and sleeping more which could be related to grief and/or overwhelmed with funeral plans. Denies any pain; Denies any new falls.    Pertinent History personal factors affecting rehab: age, history of CVA,  chronic condition, arthritis, CKD stage III, impaired memory   Limitations Walking;Other (comment)  balance, RUE stiffness, coordination;    Diagnostic tests MRI shows multiple old small lacunar infarcts;    Patient Stated Goals Improve balance and endurance; improve RUE joint mobility for fine motor tasks;    Currently in Pain? No/denies        TREATMENT: Warm up on Nustep BUE/BLE level 2 x4 min (unbilled);  Leg press, BLE 90# 2x15 with cues to slow down eccentric return for better strengthening; Leg press, BLE 90# heel raises x15 with cues to keep knees straight for better ankle strengthening;  Seated: RUE only: 3# wrist flexion/extension 2x10 with cues for positioning to improve strengthening. Patient reports moderate difficulty with 3# weight. She was able to demonstrate good control and performed exercise slowly;   Sit<>stand with 3# bar overhead lift x10; Patient reports slight fatigue following sit to stand.   Resisted walking, 12.5# forward, backward, side/side x4 way, x2 laps each with CGA for safety; able to demonstrate better eccentric weight shift for improved control;  Patient ascend/descend 4 steps with 1 rail assist x2 sets with forward reciprocal ascending, and forward non-reciprocal descending; Instructed patient to take a slightly bigger step with initial descent to improve foot clearance;  PT Education - 09/28/16 1503    Education provided Yes   Education Details HEP reinforced, strengthening, balance;    Person(s) Educated Patient   Methods Explanation;Verbal cues   Comprehension Returned demonstration;Verbal cues required;Verbalized understanding             PT Long Term Goals - 09/07/16 1011      PT LONG TERM GOAL #1   Title Patient will be independent in home exercise program to improve strength/mobility for better functional independence with ADLs.   Time 4   Period Weeks   Status On-going     PT  LONG TERM GOAL #2   Title pt will improve berg balance score by 6 pts to reduce risk of falls .   Time 4   Period Weeks   Status Partially Met     PT LONG TERM GOAL #3   Title pt will reduce 5x sit to stand time to < 15 s demosntrating improved LE strength.    Time 4   Period Weeks   Status Achieved     PT LONG TERM GOAL #4   Title Patient will increase BUE and BLE gross strength to 4+/5 as to improve functional strength for independent gait, increased standing tolerance and increased ADL ability.   Time 4   Period Weeks   Status Partially Met     PT LONG TERM GOAL #5   Title  Patient will decrease Quick DASH score by > 8 points demonstrating reduced self-reported upper extremity disability.   Time 4   Period Weeks   Status Achieved               Plan - 09/28/16 1507    Clinical Impression Statement Patient exhibits increased fatigue today as compared to previous sessions. She was able to tolerate increased resistance with wrist flexion/extension exercise. Patient also demonstrate improved weight shift with resisted weighted gait. She would benefit from additional skilled PT intervention to improve balance, gait safety and LE strength;    Rehab Potential Good   Clinical Impairments Affecting Rehab Potential positive indicator: good PLOF, good response from prior therapy; negative: chronic condition, co-morbidities; Patient's clinical presentation is evolving as she is experiencing an exacerbation of impaired balance and weakness;    PT Frequency 2x / week   PT Duration 4 weeks   PT Treatment/Interventions Cryotherapy;Electrical Stimulation;Gait training;Moist Heat;Stair training;Functional mobility training;Therapeutic activities;Therapeutic exercise;Balance training;Neuromuscular re-education;Manual techniques;Patient/family education   PT Next Visit Plan work on balance/strengthening;    [place] continue as given;    Consulted and Agree with Plan of Care  Patient      Patient will benefit from skilled therapeutic intervention in order to improve the following deficits and impairments:  Abnormal gait, Decreased mobility, Decreased coordination, Decreased activity tolerance, Decreased endurance, Decreased strength, Impaired flexibility, Difficulty walking, Decreased safety awareness, Decreased balance  Visit Diagnosis: Unsteadiness on feet  Muscle weakness (generalized)  Unspecified lack of coordination     Problem List Patient Active Problem List   Diagnosis Date Noted  . Dyslipidemia 07/22/2016  . Mixed Alzheimer's and vascular dementia 05/25/2016  . Obesity (BMI 30.0-34.9) 05/25/2016  . Late effects of CVA (cerebrovascular accident) 05/05/2016  . Ataxia 04/20/2016  . Osteopenia 01/09/2016  . Arthritis 09/24/2015  . Airway hyperreactivity 09/24/2015  . Benign hypertension 09/24/2015  . Chronic kidney disease (CKD), stage III (moderate) 09/24/2015  . Colitis 09/24/2015  . Esophagitis, reflux 09/24/2015  . History of cardioembolic cerebrovascular accident (CVA) 09/24/2015  .  Decreased potassium in the blood 09/24/2015  . History of malignant melanoma of skin 09/24/2015  . Obesity 09/24/2015  . Cerebrovascular disease 09/24/2015  . Hypercholesteremia 08/17/2015    Essica Kiker PT, DPT 09/28/2016, 3:09 PM  [place] MAIN [facility] SERVICES [address] [place], [place], [address] Phone: [phone]   Fax:  [phone]  Name: Susan Campos MRN: [id number] Date of Birth: 1932-09-16

## 2016-10-05 ENCOUNTER — Ambulatory Visit: Payer: Medicare Other | Attending: Neurology | Admitting: Physical Therapy

## 2016-10-05 ENCOUNTER — Encounter: Payer: Self-pay | Admitting: Physical Therapy

## 2016-10-05 DIAGNOSIS — M6281 Muscle weakness (generalized): Secondary | ICD-10-CM | POA: Insufficient documentation

## 2016-10-05 DIAGNOSIS — R2681 Unsteadiness on feet: Secondary | ICD-10-CM | POA: Diagnosis not present

## 2016-10-05 DIAGNOSIS — R279 Unspecified lack of coordination: Secondary | ICD-10-CM | POA: Diagnosis present

## 2016-10-05 NOTE — Therapy (Signed)
[place] MAIN [facility] SERVICES [address] [place], [place], [address] Phone: [phone]   Fax:  [phone]  Physical Therapy Treatment  Patient Details  Name: Susan Campos MRN: [id number] Date of Birth: [birth year] Referring Provider: Dr. Jennings Books  Encounter Date: 10/05/2016      PT End of Session - 10/05/16 1151    Visit Number 15   Number of Visits 28   Date for PT Re-Evaluation 10/05/16   Authorization Type gcode 5   Authorization Time Period 10   PT Start Time 1114   PT Stop Time 1158   PT Time Calculation (min) 44 min   Activity Tolerance No increased pain;Patient tolerated treatment well   Behavior During Therapy [facility] for tasks assessed/performed      Past Medical History:  Diagnosis Date  . Arthritis    HIPS  . Asthma    HX OF  . Cancer ([place])    skin  . Orthopnea    sleeps in recliner, cannot lay flat on her back  . Stroke [facility])    CVA 2012/ MEMORY LOSS, 1999, 1 more in 2011  . Vertigo    OCCASIONAL    Past Surgical History:  Procedure Laterality Date  . CATARACT EXTRACTION Right   . CATARACT EXTRACTION W/PHACO Left 05/27/2015   Procedure: CATARACT EXTRACTION PHACO AND INTRAOCULAR LENS PLACEMENT (IOC);  Surgeon: Leandrew Koyanagi, MD;  Location: [place];  Service: Ophthalmology;  Laterality: Left;  . KNEE ARTHROSCOPY    . MELANOMA EXCISION     from chest  . MOUTH SURGERY    . REPLACEMENT TOTAL KNEE BILATERAL    . UTERINE SUSPENSION      There were no vitals filed for this visit.      Subjective Assessment - 10/05/16 1121    Subjective Patient reports that she feels "lighter" since going to her daughter in [place]. she reports still feeling tired and sleeping a lot this week trying to recover from caring for her family; She denies any pain currently;    Pertinent History personal factors affecting rehab: age, history of CVA, chronic condition, arthritis, CKD stage III,  impaired memory   Limitations Walking;Other (comment)  balance, RUE stiffness, coordination;    Diagnostic tests MRI shows multiple old small lacunar infarcts;    Patient Stated Goals Improve balance and endurance; improve RUE joint mobility for fine motor tasks;    Currently in Pain? No/denies          TREATMENT: Warm up on Nustep BUE/BLE level 2 x10 min (unbilled);  Standing with green tband around both legs: Hip abduction x15 bilaterally; Hip extension x15 bilaterally; Side stepping x10 feet x3 laps each direction  Patient required min-moderate verbal/tactile cues for correct exercise technique including to increase knee extension for better hip strengthening;  Heel raises standing x15;  Leg press, BLE 90# 2x15 with cues to slow down eccentric return for better strengthening;  Seated: Ankle DF green tband 2x10 bilaterally with cues for positioning to improve strengthening;  RUE only: 3# wrist flexion/extension 2x15 each with min VCs for positioning to improve wrist strengthening; RUE gripping pegs (orange, green, yellow, blue) x4 each (25# resistance with gripper) with min VCs for hand placement to improve efficiency with grip;  Resisted walking forward/backward 2 way, 12.5# resistance x 2laps each with CGA to close supervision; Patient demonstrates better dynamic balance with backwards walking with less unsteadiness;  PT Education - 10/05/16 1151    Education provided Yes   Education Details HEP reinforced, strengthening, balance;    Person(s) Educated Patient   Methods Explanation;Verbal cues   Comprehension Verbalized understanding;Returned demonstration;Verbal cues required             PT Long Term Goals - 09/07/16 1011      PT LONG TERM GOAL #1   Title Patient will be independent in home exercise program to improve strength/mobility for better functional independence with ADLs.   Time 4   Period Weeks   Status  On-going     PT LONG TERM GOAL #2   Title pt will improve berg balance score by 6 pts to reduce risk of falls .   Time 4   Period Weeks   Status Partially Met     PT LONG TERM GOAL #3   Title pt will reduce 5x sit to stand time to < 15 s demosntrating improved LE strength.    Time 4   Period Weeks   Status Achieved     PT LONG TERM GOAL #4   Title Patient will increase BUE and BLE gross strength to 4+/5 as to improve functional strength for independent gait, increased standing tolerance and increased ADL ability.   Time 4   Period Weeks   Status Partially Met     PT LONG TERM GOAL #5   Title  Patient will decrease Quick DASH score by > 8 points demonstrating reduced self-reported upper extremity disability.   Time 4   Period Weeks   Status Achieved               Plan - 10/05/16 1246    Clinical Impression Statement Patient instructed in advanced UE/ LE strengthening and balance exercise; She requires min Vcs for correct exercise technique, particularly to increase ROM and improve posture for better strengthening. She demonstrates improved balance with backwards walking with less unsteadiness. Patient would benefit from additional skilled PT intervention to improve strength, balance and gait safety;    Rehab Potential Good   Clinical Impairments Affecting Rehab Potential positive indicator: good PLOF, good response from prior therapy; negative: chronic condition, co-morbidities; Patient's clinical presentation is evolving as she is experiencing an exacerbation of impaired balance and weakness;    PT Frequency 2x / week   PT Duration 4 weeks   PT Treatment/Interventions Cryotherapy;Electrical Stimulation;Gait training;Moist Heat;Stair training;Functional mobility training;Therapeutic activities;Therapeutic exercise;Balance training;Neuromuscular re-education;Manual techniques;Patient/family education   PT Next Visit Plan work on balance/strengthening;    [place]  continue as given;    Consulted and Agree with Plan of Care Patient      Patient will benefit from skilled therapeutic intervention in order to improve the following deficits and impairments:  Abnormal gait, Decreased mobility, Decreased coordination, Decreased activity tolerance, Decreased endurance, Decreased strength, Impaired flexibility, Difficulty walking, Decreased safety awareness, Decreased balance  Visit Diagnosis: Unsteadiness on feet  Muscle weakness (generalized)  Unspecified lack of coordination     Problem List Patient Active Problem List   Diagnosis Date Noted  . Dyslipidemia 07/22/2016  . Mixed Alzheimer's and vascular dementia 05/25/2016  . Obesity (BMI 30.0-34.9) 05/25/2016  . Late effects of CVA (cerebrovascular accident) 05/05/2016  . Ataxia 04/20/2016  . Osteopenia 01/09/2016  . Arthritis 09/24/2015  . Airway hyperreactivity 09/24/2015  . Benign hypertension 09/24/2015  . Chronic kidney disease (CKD), stage III (moderate) 09/24/2015  . Colitis 09/24/2015  . Esophagitis, reflux 09/24/2015  . History of cardioembolic  cerebrovascular accident (CVA) 09/24/2015  . Decreased potassium in the blood 09/24/2015  . History of malignant melanoma of skin 09/24/2015  . Obesity 09/24/2015  . Cerebrovascular disease 09/24/2015  . Hypercholesteremia 08/17/2015    Briona Korpela PT, DPT 10/05/2016, 12:48 PM  [place] MAIN [facility] SERVICES [address] [place], [place], [address] Phone: [phone]   Fax:  [phone]  Name: Susan Campos MRN: [id number] Date of Birth: Dec 27, 1932

## 2016-10-06 ENCOUNTER — Ambulatory Visit: Payer: Self-pay

## 2016-10-10 ENCOUNTER — Other Ambulatory Visit: Payer: Self-pay | Admitting: Family Medicine

## 2016-10-11 ENCOUNTER — Encounter: Payer: Self-pay | Admitting: Physical Therapy

## 2016-10-11 ENCOUNTER — Ambulatory Visit: Payer: Medicare Other | Admitting: Physical Therapy

## 2016-10-11 ENCOUNTER — Telehealth: Payer: Self-pay | Admitting: Family Medicine

## 2016-10-11 DIAGNOSIS — R2681 Unsteadiness on feet: Secondary | ICD-10-CM | POA: Diagnosis not present

## 2016-10-11 DIAGNOSIS — R279 Unspecified lack of coordination: Secondary | ICD-10-CM

## 2016-10-11 DIAGNOSIS — M6281 Muscle weakness (generalized): Secondary | ICD-10-CM

## 2016-10-11 NOTE — Telephone Encounter (Signed)
Called Pt to schedule AWV with [facility] - knb °

## 2016-10-11 NOTE — Therapy (Signed)
[place] MAIN [facility] SERVICES [address] [place], [place], [address] Phone: [phone]   Fax:  [phone]  Physical Therapy Treatment  Patient Details  Name: Susan Campos MRN: [id number] Date of Birth: 09/23/1932 Referring Provider: Dr. Jennings Books  Encounter Date: 10/11/2016      PT End of Session - 10/11/16 1637    Visit Number 16   Number of Visits 36   Date for PT Re-Evaluation 11/08/16   Authorization Type gcode [id number]   Authorization Time Period 10   PT Start Time 1632   PT Stop Time 8127   PT Time Calculation (min) 43 min   Equipment Utilized During Treatment Gait belt   Activity Tolerance No increased pain;Patient tolerated treatment well   Behavior During Therapy [facility] for tasks assessed/performed      Past Medical History:  Diagnosis Date  . Arthritis    HIPS  . Asthma    HX OF  . Cancer ([place])    skin  . Orthopnea    sleeps in recliner, cannot lay flat on her back  . Stroke [facility])    CVA 2012/ MEMORY LOSS, 1999, 1 more in 2011  . Vertigo    OCCASIONAL    Past Surgical History:  Procedure Laterality Date  . CATARACT EXTRACTION Right   . CATARACT EXTRACTION W/PHACO Left 05/27/2015   Procedure: CATARACT EXTRACTION PHACO AND INTRAOCULAR LENS PLACEMENT (IOC);  Surgeon: Leandrew Koyanagi, MD;  Location: [place];  Service: Ophthalmology;  Laterality: Left;  . KNEE ARTHROSCOPY    . MELANOMA EXCISION     from chest  . MOUTH SURGERY    . REPLACEMENT TOTAL KNEE BILATERAL    . UTERINE SUSPENSION      There were no vitals filed for this visit.      Subjective Assessment - 10/11/16 1636    Subjective Patient reports doing okay today; she denies any pain; Reports sleeping late this morning;    Pertinent History personal factors affecting rehab: age, history of CVA, chronic condition, arthritis, CKD stage III, impaired memory   Limitations Walking;Other (comment)  balance, RUE stiffness,  coordination;    Diagnostic tests MRI shows multiple old small lacunar infarcts;    Patient Stated Goals Improve balance and endurance; improve RUE joint mobility for fine motor tasks;    Currently in Pain? No/denies            [facility] PT Assessment - 10/11/16 0001      Standardized Balance Assessment   Five times sit to stand comments  12 sec without HHA (low fall risk)   10 Meter Walk 0.8 m/s without AD (home/community ambulator, more impaired from 09/07/16 which was 1.0 m/s) pts impairement is related to taking a break from loosing a family member ;       TREATMENT: Warm up on Nustep BUE/BLE level 2 x5 min (unbilled);  Standing with green tband around both legs: Hip abduction x15 bilaterally; Hip extension x15 bilaterally; Side stepping x10 feet x3 laps each direction  Patient required min-moderate verbal/tactile cues for correct exercise technique including to increase knee extension for better hip strengthening;  Heel raises standing x20;   Seated: Ankle DF green tband 2x15 bilaterally with cues for positioning to improve strengthening;  RUE only: 3# wrist flexion/extension 2x15 each with min VCs for positioning to improve wrist strengthening; 3# wrist circles clockwise/counterclockwise x10 each x2 sets with min A to hold elbow still to facilitate wrist movement;  RUE wrist  movement with yellow geometric hand tool to facilitate better wrist movement;   Resisted walking forward/backward 2 way, 12.5# resistance x 2laps each with CGA to close supervision; Patient demonstrates better dynamic balance with backwards walking with less unsteadiness;                           PT Education - 10/11/16 1637    Education provided Yes   Education Details HEP reinforced, strengthening, balance;    Person(s) Educated Patient   Methods Explanation;Verbal cues   Comprehension Verbalized understanding;Returned demonstration;Verbal cues required              PT Long Term Goals - 10/11/16 1640      PT LONG TERM GOAL #1   Title Patient will be independent in home exercise program to improve strength/mobility for better functional independence with ADLs.   Time 4   Period Weeks   Status On-going     PT LONG TERM GOAL #2   Title pt will improve berg balance score by 6 pts to reduce risk of falls .   Time 4   Period Weeks   Status Partially Met     PT LONG TERM GOAL #3   Title pt will reduce 5x sit to stand time to < 15 s demosntrating improved LE strength.    Time 4   Period Weeks   Status Achieved     PT LONG TERM GOAL #4   Title Patient will increase BUE and BLE gross strength to 4+/5 as to improve functional strength for independent gait, increased standing tolerance and increased ADL ability.   Time 4   Period Weeks   Status Partially Met     PT LONG TERM GOAL #5   Title  Patient will decrease Quick DASH score by > 8 points demonstrating reduced self-reported upper extremity disability.   Time 4   Period Weeks   Status Achieved               Plan - 10/11/16 1648    Clinical Impression Statement Patient demonstrates slower gait speed today as compared to 1 month ago. This could be related to missed appointments due to death in her family and having increased family obligations which limited her compliance with exercise. She was instructed in advanced LE strengthening exercise today; Patient requires min vcs for correct exercise technique to improve strength and balance; She continues to have weakness on right side;  Patient would benefit from additional skilled PT intervention to improve strength, balance and gait safety;    Rehab Potential Good   Clinical Impairments Affecting Rehab Potential positive indicator: good PLOF, good response from prior therapy; negative: chronic condition, co-morbidities; Patient's clinical presentation is evolving as she is experiencing an exacerbation of impaired balance and weakness;    PT  Frequency 2x / week   PT Duration 4 weeks   PT Treatment/Interventions Cryotherapy;Electrical Stimulation;Gait training;Moist Heat;Stair training;Functional mobility training;Therapeutic activities;Therapeutic exercise;Balance training;Neuromuscular re-education;Manual techniques;Patient/family education   PT Next Visit Plan work on balance/strengthening;    [place] continue as given;    Consulted and Agree with Plan of Care Patient      Patient will benefit from skilled therapeutic intervention in order to improve the following deficits and impairments:  Abnormal gait, Decreased mobility, Decreased coordination, Decreased activity tolerance, Decreased endurance, Decreased strength, Impaired flexibility, Difficulty walking, Decreased safety awareness, Decreased balance  Visit Diagnosis: Unsteadiness on feet  Muscle weakness (  generalized)  Unspecified lack of coordination     Problem List Patient Active Problem List   Diagnosis Date Noted  . Dyslipidemia 07/22/2016  . Mixed Alzheimer's and vascular dementia 05/25/2016  . Obesity (BMI 30.0-34.9) 05/25/2016  . Late effects of CVA (cerebrovascular accident) 05/05/2016  . Ataxia 04/20/2016  . Osteopenia 01/09/2016  . Arthritis 09/24/2015  . Airway hyperreactivity 09/24/2015  . Benign hypertension 09/24/2015  . Chronic kidney disease (CKD), stage III (moderate) 09/24/2015  . Colitis 09/24/2015  . Esophagitis, reflux 09/24/2015  . History of cardioembolic cerebrovascular accident (CVA) 09/24/2015  . Decreased potassium in the blood 09/24/2015  . History of malignant melanoma of skin 09/24/2015  . Obesity 09/24/2015  . Cerebrovascular disease 09/24/2015  . Hypercholesteremia 08/17/2015    Trotter,Margaret PT, DPT 10/11/2016, 5:21 PM  [place] MAIN [facility] SERVICES [address] [place], [place], [address] Phone: [phone]   Fax:  [phone]  Name: Ishani Goldwasser MRN: [id number] Date of Birth: 10-10-1932

## 2016-10-17 ENCOUNTER — Encounter: Payer: Self-pay | Admitting: Physical Therapy

## 2016-10-17 ENCOUNTER — Ambulatory Visit: Payer: Medicare Other | Admitting: Physical Therapy

## 2016-10-17 DIAGNOSIS — R279 Unspecified lack of coordination: Secondary | ICD-10-CM

## 2016-10-17 DIAGNOSIS — R2681 Unsteadiness on feet: Secondary | ICD-10-CM

## 2016-10-17 DIAGNOSIS — M6281 Muscle weakness (generalized): Secondary | ICD-10-CM

## 2016-10-17 NOTE — Therapy (Signed)
[place] MAIN [facility] SERVICES [address] [place], [place], [address] Phone: [phone]   Fax:  [phone]  Physical Therapy Treatment  Patient Details  Name: Susan Campos MRN: [id number] Date of Birth: 1933-07-28 Referring Provider: Dr. Jennings Books  Encounter Date: 10/17/2016      PT End of Session - 10/17/16 1121    Visit Number 17   Number of Visits 36   Date for PT Re-Evaluation 11/08/16   Authorization Type gcode 7   Authorization Time Period 10   PT Start Time 1115   PT Stop Time 1158   PT Time Calculation (min) 43 min   Equipment Utilized During Treatment Gait belt   Activity Tolerance No increased pain;Patient tolerated treatment well   Behavior During Therapy [facility] for tasks assessed/performed      Past Medical History:  Diagnosis Date  . Arthritis    HIPS  . Asthma    HX OF  . Cancer ([place])    skin  . Orthopnea    sleeps in recliner, cannot lay flat on her back  . Stroke [facility])    CVA 2012/ MEMORY LOSS, 1999, 1 more in 2011  . Vertigo    OCCASIONAL    Past Surgical History:  Procedure Laterality Date  . CATARACT EXTRACTION Right   . CATARACT EXTRACTION W/PHACO Left 05/27/2015   Procedure: CATARACT EXTRACTION PHACO AND INTRAOCULAR LENS PLACEMENT (IOC);  Surgeon: Leandrew Koyanagi, MD;  Location: [place];  Service: Ophthalmology;  Laterality: Left;  . KNEE ARTHROSCOPY    . MELANOMA EXCISION     from chest  . MOUTH SURGERY    . REPLACEMENT TOTAL KNEE BILATERAL    . UTERINE SUSPENSION      There were no vitals filed for this visit.      Subjective Assessment - 10/17/16 1121    Subjective Patient reports doing well; she states, "I am beginning to feel better and feel more positive"    Pertinent History personal factors affecting rehab: age, history of CVA, chronic condition, arthritis, CKD stage III, impaired memory   Limitations Walking;Other (comment)  balance, RUE stiffness,  coordination;    Diagnostic tests MRI shows multiple old small lacunar infarcts;    Patient Stated Goals Improve balance and endurance; improve RUE joint mobility for fine motor tasks;    Currently in Pain? No/denies            TREATMENT: Warm up on Nustep BUE/BLE level 2 x4 min (unbilled);  Leg press, BLE 105# 2x12,  with cues to slow down eccentric return for better strengthening;  Standing with green tband around both legs: Hip abduction x15 bilaterally; Hip extension x15 bilaterally; Monster walk diagonals forward/backward x10 feet x 2 laps each with 1 rail assist; Patient required min-moderate verbal/tactile cues for correct exercise techniqueincluding to increase knee extension for better hip strengthening;  Heel raises standing x20;  Seated: 3# wrist flexion/extension 2x15 each with min VCs for positioning to improve wrist strengthening; 3# wrist circles clockwise/counterclockwise x10 each x2 sets with min A to hold elbow still to facilitate wrist movement;  Required min VCs to isolate wrist movement for better strengthening to improve ADLs and UE mobility;    NMR: Instructed patient in grapevine (cross over front/behind) x6 laps each direction with rail assist with mod VCs and demonstration for sequencing; Advanced HEP to improve sequencing and coordination with multiple steps;  PT Education - 10/17/16 1121    Education provided Yes   Education Details strengthening, balance, HEP reinforced;    Person(s) Educated Patient   Methods Explanation;Verbal cues   Comprehension Verbalized understanding;Returned demonstration;Verbal cues required             PT Long Term Goals - 10/11/16 1640      PT LONG TERM GOAL #1   Title Patient will be independent in home exercise program to improve strength/mobility for better functional independence with ADLs.   Time 4   Period Weeks   Status On-going     PT LONG TERM GOAL #2   Title  pt will improve berg balance score by 6 pts to reduce risk of falls .   Time 4   Period Weeks   Status Partially Met     PT LONG TERM GOAL #3   Title pt will reduce 5x sit to stand time to < 15 s demosntrating improved LE strength.    Time 4   Period Weeks   Status Achieved     PT LONG TERM GOAL #4   Title Patient will increase BUE and BLE gross strength to 4+/5 as to improve functional strength for independent gait, increased standing tolerance and increased ADL ability.   Time 4   Period Weeks   Status Partially Met     PT LONG TERM GOAL #5   Title  Patient will decrease Quick DASH score by > 8 points demonstrating reduced self-reported upper extremity disability.   Time 4   Period Weeks   Status Achieved               Plan - 10/17/16 1355    Clinical Impression Statement Patient instructed in advanced LE strengthening and balance exercise. Patient instructed in gait cross overs to challenge balance with narrow base of support and improve weight shift. Patient able to complete with finger tip hold. Patient instructed in UE strengthening to improve grip/wrist strength for better functional mobility; She continues to require min Vcs for correct exercise technique; she would benefit from additional skilled PT intervention to improve balance/gait safety and functional strength for mobility;    Rehab Potential Good   Clinical Impairments Affecting Rehab Potential positive indicator: good PLOF, good response from prior therapy; negative: chronic condition, co-morbidities; Patient's clinical presentation is evolving as she is experiencing an exacerbation of impaired balance and weakness;    PT Frequency 2x / week   PT Duration 4 weeks   PT Treatment/Interventions Cryotherapy;Electrical Stimulation;Gait training;Moist Heat;Stair training;Functional mobility training;Therapeutic activities;Therapeutic exercise;Balance training;Neuromuscular re-education;Manual techniques;Patient/family  education   PT Next Visit Plan work on balance/strengthening;    PT Home Exercise Plan advanced with cross over step; see patient instructions;    Consulted and Agree with Plan of Care Patient      Patient will benefit from skilled therapeutic intervention in order to improve the following deficits and impairments:  Abnormal gait, Decreased mobility, Decreased coordination, Decreased activity tolerance, Decreased endurance, Decreased strength, Impaired flexibility, Difficulty walking, Decreased safety awareness, Decreased balance  Visit Diagnosis: Unsteadiness on feet  Muscle weakness (generalized)  Unspecified lack of coordination     Problem List Patient Active Problem List   Diagnosis Date Noted  . Dyslipidemia 07/22/2016  . Mixed Alzheimer's and vascular dementia 05/25/2016  . Obesity (BMI 30.0-34.9) 05/25/2016  . Late effects of CVA (cerebrovascular accident) 05/05/2016  . Ataxia 04/20/2016  . Osteopenia 01/09/2016  . Arthritis 09/24/2015  . Airway hyperreactivity 09/24/2015  .  Benign hypertension 09/24/2015  . Chronic kidney disease (CKD), stage III (moderate) 09/24/2015  . Colitis 09/24/2015  . Esophagitis, reflux 09/24/2015  . History of cardioembolic cerebrovascular accident (CVA) 09/24/2015  . Decreased potassium in the blood 09/24/2015  . History of malignant melanoma of skin 09/24/2015  . Obesity 09/24/2015  . Cerebrovascular disease 09/24/2015  . Hypercholesteremia 08/17/2015    Shriyans Kuenzi PT, DPT 10/17/2016, 1:57 PM  [place] MAIN [facility] SERVICES [address] [place], [place], [address] Phone: [phone]   Fax:  [phone]  Name: Susan Campos MRN: [id number] Date of Birth: [birth year]

## 2016-10-17 NOTE — Patient Instructions (Addendum)
Grapevine Walk    Stepping to side, cross other leg in front. Side step again and cross other leg behind.  Perform _2-3__ laps side stepping right. Hold onto counter as needed for balance;   Copyright  VHI. All rights reserved.

## 2016-10-19 ENCOUNTER — Ambulatory Visit: Payer: Medicare Other | Admitting: Physical Therapy

## 2016-10-24 ENCOUNTER — Ambulatory Visit: Payer: Medicare Other | Admitting: Physical Therapy

## 2016-10-24 ENCOUNTER — Encounter: Payer: Self-pay | Admitting: Physical Therapy

## 2016-10-24 DIAGNOSIS — R2681 Unsteadiness on feet: Secondary | ICD-10-CM

## 2016-10-24 DIAGNOSIS — M6281 Muscle weakness (generalized): Secondary | ICD-10-CM

## 2016-10-24 DIAGNOSIS — R279 Unspecified lack of coordination: Secondary | ICD-10-CM

## 2016-10-24 NOTE — Therapy (Signed)
[place] MAIN [facility] SERVICES [address] [place], [place], [address] Phone: [phone]   Fax:  [phone]  Physical Therapy Treatment  Patient Details  Name: Susan Campos MRN: [id number] Date of Birth: 01-09-1933 Referring Provider: Dr. Jennings Books  Encounter Date: 10/24/2016      PT End of Session - 10/24/16 1125    Visit Number 18   Number of Visits 36   Date for PT Re-Evaluation 11/08/16   Authorization Type gcode 8   Authorization Time Period 10   PT Start Time 1115   PT Stop Time 1200   PT Time Calculation (min) 45 min   Equipment Utilized During Treatment Gait belt   Activity Tolerance No increased pain;Patient tolerated treatment well   Behavior During Therapy [facility] for tasks assessed/performed      Past Medical History:  Diagnosis Date  . Arthritis    HIPS  . Asthma    HX OF  . Cancer ([place])    skin  . Orthopnea    sleeps in recliner, cannot lay flat on her back  . Stroke [facility])    CVA 2012/ MEMORY LOSS, 1999, 1 more in 2011  . Vertigo    OCCASIONAL    Past Surgical History:  Procedure Laterality Date  . CATARACT EXTRACTION Right   . CATARACT EXTRACTION W/PHACO Left 05/27/2015   Procedure: CATARACT EXTRACTION PHACO AND INTRAOCULAR LENS PLACEMENT (IOC);  Surgeon: Leandrew Koyanagi, MD;  Location: [place];  Service: Ophthalmology;  Laterality: Left;  . KNEE ARTHROSCOPY    . MELANOMA EXCISION     from chest  . MOUTH SURGERY    . REPLACEMENT TOTAL KNEE BILATERAL    . UTERINE SUSPENSION      There were no vitals filed for this visit.      Subjective Assessment - 10/24/16 1124    Subjective Patient reports doing well; no new falls; She reports that she has been trying the grapevine exercise and it helps her loosen up;    Pertinent History personal factors affecting rehab: age, history of CVA, chronic condition, arthritis, CKD stage III, impaired memory   Limitations Walking;Other  (comment)  balance, RUE stiffness, coordination;    Diagnostic tests MRI shows multiple old small lacunar infarcts;    Patient Stated Goals Improve balance and endurance; improve RUE joint mobility for fine motor tasks;    Currently in Pain? No/denies      TREATMENT: Warm up on Nustep BUE/BLE level 2 x4 min (unbilled);  Leg press, BLE 105# 2x12,  with cues to slow down eccentric return for better strengthening;  NMR: Instructed patient in grapevine (cross over front/behind) x4 laps (15 feet) each direction with HHA with min VCs and demonstration for sequencing; Patient able to demonstrate better weight shift and pelvic rotation for better step length;  Instructed patient in electric slide dance with all steps side/side, backward/forward and step kick out to side for 90 degree turn; She required 1 HHA and max VCs for sequencing, step technique, weight shift and positioning x23 min; Patient able to negotiate turn well but hesitates when stepping behind with left foot; She was able to progress to once or twice being more smooth and consistent with steps;   She would benefit from additional instruction with steps to achieve goal of doing electric slide for personal benefit; This dance step challenges patient's coordination, strength, balance and flexibility as well as cognition with learning new step;  PT Education - 10/24/16 1125    Education provided Yes   Education Details strengthening, balance, HEP reinforced;    Person(s) Educated Patient   Methods Explanation;Verbal cues   Comprehension Verbalized understanding;Returned demonstration;Verbal cues required             PT Long Term Goals - 10/11/16 1640      PT LONG TERM GOAL #1   Title Patient will be independent in home exercise program to improve strength/mobility for better functional independence with ADLs.   Time 4   Period Weeks   Status On-going     PT LONG TERM  GOAL #2   Title pt will improve berg balance score by 6 pts to reduce risk of falls .   Time 4   Period Weeks   Status Partially Met     PT LONG TERM GOAL #3   Title pt will reduce 5x sit to stand time to < 15 s demosntrating improved LE strength.    Time 4   Period Weeks   Status Achieved     PT LONG TERM GOAL #4   Title Patient will increase BUE and BLE gross strength to 4+/5 as to improve functional strength for independent gait, increased standing tolerance and increased ADL ability.   Time 4   Period Weeks   Status Partially Met     PT LONG TERM GOAL #5   Title  Patient will decrease Quick DASH score by > 8 points demonstrating reduced self-reported upper extremity disability.   Time 4   Period Weeks   Status Achieved               Plan - 10/24/16 1312    Clinical Impression Statement Patient instructed in dynamic balance tasks including grapevine/crossover and steps for electric slide. She requires several cues for positioning, sequencing, and weight shift; Patient is able to demonstrate better weight shift and balance with cross overs. She does have difficulty with advanced tasks putting all steps together; She reports improved flexibility and balance with advanced exercise. Patient does fatigue with prolonged standing and mobility; She would benefit from additional skilled PT intervention to improve balance, gait safety and strength for return to PLOF.      Rehab Potential Good   Clinical Impairments Affecting Rehab Potential positive indicator: good PLOF, good response from prior therapy; negative: chronic condition, co-morbidities; Patient's clinical presentation is evolving as she is experiencing an exacerbation of impaired balance and weakness;    PT Frequency 2x / week   PT Duration 4 weeks   PT Treatment/Interventions Cryotherapy;Electrical Stimulation;Gait training;Moist Heat;Stair training;Functional mobility training;Therapeutic activities;Therapeutic  exercise;Balance training;Neuromuscular re-education;Manual techniques;Patient/family education   PT Next Visit Plan work on balance/strengthening;    PT Home Exercise Plan advanced with cross over step; see patient instructions;    Consulted and Agree with Plan of Care Patient      Patient will benefit from skilled therapeutic intervention in order to improve the following deficits and impairments:  Abnormal gait, Decreased mobility, Decreased coordination, Decreased activity tolerance, Decreased endurance, Decreased strength, Impaired flexibility, Difficulty walking, Decreased safety awareness, Decreased balance  Visit Diagnosis: Unsteadiness on feet  Muscle weakness (generalized)  Unspecified lack of coordination     Problem List Patient Active Problem List   Diagnosis Date Noted  . Dyslipidemia 07/22/2016  . Mixed Alzheimer's and vascular dementia 05/25/2016  . Obesity (BMI 30.0-34.9) 05/25/2016  . Late effects of CVA (cerebrovascular accident) 05/05/2016  . Ataxia 04/20/2016  . Osteopenia 01/09/2016  .  Arthritis 09/24/2015  . Airway hyperreactivity 09/24/2015  . Benign hypertension 09/24/2015  . Chronic kidney disease (CKD), stage III (moderate) 09/24/2015  . Colitis 09/24/2015  . Esophagitis, reflux 09/24/2015  . History of cardioembolic cerebrovascular accident (CVA) 09/24/2015  . Decreased potassium in the blood 09/24/2015  . History of malignant melanoma of skin 09/24/2015  . Obesity 09/24/2015  . Cerebrovascular disease 09/24/2015  . Hypercholesteremia 08/17/2015    Maryna Yeagle PT, DPT 10/24/2016, 1:26 PM  [place] MAIN [facility] SERVICES [address] [place], [place], [address] Phone: [phone]   Fax:  [phone]  Name: Susan Campos MRN: [id number] Date of Birth: 1932-08-10

## 2016-10-26 ENCOUNTER — Ambulatory Visit: Payer: Medicare Other | Admitting: Physical Therapy

## 2016-10-26 ENCOUNTER — Encounter: Payer: Self-pay | Admitting: Physical Therapy

## 2016-10-26 DIAGNOSIS — R2681 Unsteadiness on feet: Secondary | ICD-10-CM

## 2016-10-26 DIAGNOSIS — M6281 Muscle weakness (generalized): Secondary | ICD-10-CM

## 2016-10-26 DIAGNOSIS — R279 Unspecified lack of coordination: Secondary | ICD-10-CM

## 2016-10-26 NOTE — Therapy (Signed)
[place] MAIN [facility] SERVICES [address] [place], [place], [address] Phone: [phone]   Fax:  [phone]  Physical Therapy Treatment  Patient Details  Name: Susan Campos MRN: [id number] Date of Birth: 1933-05-04 Referring Provider: Dr. Jennings Books  Encounter Date: 10/26/2016      PT End of Session - 10/26/16 1609    Visit Number 19   Number of Visits 36   Date for PT Re-Evaluation 11/08/16   Authorization Type gcode 9   Authorization Time Period 10   PT Start Time 1602   PT Stop Time 1645   PT Time Calculation (min) 43 min   Equipment Utilized During Treatment Gait belt   Activity Tolerance No increased pain;Patient tolerated treatment well   Behavior During Therapy [facility] for tasks assessed/performed      Past Medical History:  Diagnosis Date  . Arthritis    HIPS  . Asthma    HX OF  . Cancer ([place])    skin  . Orthopnea    sleeps in recliner, cannot lay flat on her back  . Stroke [facility])    CVA 2012/ MEMORY LOSS, 1999, 1 more in 2011  . Vertigo    OCCASIONAL    Past Surgical History:  Procedure Laterality Date  . CATARACT EXTRACTION Right   . CATARACT EXTRACTION W/PHACO Left 05/27/2015   Procedure: CATARACT EXTRACTION PHACO AND INTRAOCULAR LENS PLACEMENT (IOC);  Surgeon: Leandrew Koyanagi, MD;  Location: [place];  Service: Ophthalmology;  Laterality: Left;  . KNEE ARTHROSCOPY    . MELANOMA EXCISION     from chest  . MOUTH SURGERY    . REPLACEMENT TOTAL KNEE BILATERAL    . UTERINE SUSPENSION      There were no vitals filed for this visit.      Subjective Assessment - 10/26/16 1600    Subjective Patient reports increased fatigue after doing pulmonary rehab. She was on the Nustep for 25 min; She reports being stiff and tired after waiting in the waiting room;    Pertinent History personal factors affecting rehab: age, history of CVA, chronic condition, arthritis, CKD stage III, impaired memory    Limitations Walking;Other (comment)  balance, RUE stiffness, coordination;    Diagnostic tests MRI shows multiple old small lacunar infarcts;    Patient Stated Goals Improve balance and endurance; improve RUE joint mobility for fine motor tasks;    Currently in Pain? No/denies          TREATMENT: Warm up on Nustep BUE/BLE level 2 x4 min (unbilled);  NMR: Instructed patient in [place] (cross over front/behind) x4 laps (15 feet) each direction with HHA with min VCs and demonstration for sequencing; Patient able to demonstrate better weight shift and pelvic rotation for better step length;  Instructed patient in electric slide dance with all steps side/side, backward/forward and step kick out to side for 90 degree turn; She required 1 HHA and max VCs for sequencing, step technique, weight shift and positioning x18 min; Patient able to negotiate turn well but hesitates when stepping behind with left foot; Instructed patient in step together when going to side rather than step behind to improve smoothness and reduce fall risk;   She would benefit from additional instruction with steps to achieve goal of doing electric slide for personal benefit; This dance step challenges patient's coordination, strength, balance and flexibility as well as cognition with learning new step;   Instructed patient in UE strengthening: RUE: clothespins, 2 finger  pinch, #4 green, blue and black x2 sets each;  RUE: Wrist flexion/extension 3# 2x15 with cues to avoid elbow movement and isolate wrist movement for strengthening; Wrist circles, clockwise 3# x10 with mod VCs for ROM to improve wrist flexibility;  Patient required min-moderate verbal/tactile cues for correct exercise technique.                         PT Education - 10/26/16 1607    Education provided Yes   Education Details strengthening, balance, HEP reinforced;    Person(s) Educated Patient   Methods  Explanation;Verbal cues             PT Long Term Goals - 10/11/16 1640      PT LONG TERM GOAL #1   Title Patient will be independent in home exercise program to improve strength/mobility for better functional independence with ADLs.   Time 4   Period Weeks   Status On-going     PT LONG TERM GOAL #2   Title pt will improve berg balance score by 6 pts to reduce risk of falls .   Time 4   Period Weeks   Status Partially Met     PT LONG TERM GOAL #3   Title pt will reduce 5x sit to stand time to < 15 s demosntrating improved LE strength.    Time 4   Period Weeks   Status Achieved     PT LONG TERM GOAL #4   Title Patient will increase BUE and BLE gross strength to 4+/5 as to improve functional strength for independent gait, increased standing tolerance and increased ADL ability.   Time 4   Period Weeks   Status Partially Met     PT LONG TERM GOAL #5   Title  Patient will decrease Quick DASH score by > 8 points demonstrating reduced self-reported upper extremity disability.   Time 4   Period Weeks   Status Achieved               Plan - 10/26/16 1651    Clinical Impression Statement Patient expressed increased fatigue today; She just finished pulmonary rehab and is tired. Instructed patient in crossover step and other dynamic steps as part of electric slide. Patient continues to demonstrate incoordination with difficulty continuing steps once turned 90 degrees. Instructed patient in wrist strengthening and finger strengthening to improve functional strength for ADLs and work tasks such as quilting. she would benefit from additional skilled PT intervention to improve strength, balance and mobility;    Rehab Potential Good   Clinical Impairments Affecting Rehab Potential positive indicator: good PLOF, good response from prior therapy; negative: chronic condition, co-morbidities; Patient's clinical presentation is evolving as she is experiencing an exacerbation of impaired  balance and weakness;    PT Frequency 2x / week   PT Duration 4 weeks   PT Treatment/Interventions Cryotherapy;Electrical Stimulation;Gait training;Moist Heat;Stair training;Functional mobility training;Therapeutic activities;Therapeutic exercise;Balance training;Neuromuscular re-education;Manual techniques;Patient/family education   PT Next Visit Plan work on balance/strengthening;    PT Home Exercise Plan advanced with cross over step; see patient instructions;    Consulted and Agree with Plan of Care Patient      Patient will benefit from skilled therapeutic intervention in order to improve the following deficits and impairments:  Abnormal gait, Decreased mobility, Decreased coordination, Decreased activity tolerance, Decreased endurance, Decreased strength, Impaired flexibility, Difficulty walking, Decreased safety awareness, Decreased balance  Visit Diagnosis: Unsteadiness on feet  Muscle weakness (generalized)  Unspecified lack of coordination     Problem List Patient Active Problem List   Diagnosis Date Noted  . Dyslipidemia 07/22/2016  . Mixed Alzheimer's and vascular dementia 05/25/2016  . Obesity (BMI 30.0-34.9) 05/25/2016  . Late effects of CVA (cerebrovascular accident) 05/05/2016  . Ataxia 04/20/2016  . Osteopenia 01/09/2016  . Arthritis 09/24/2015  . Airway hyperreactivity 09/24/2015  . Benign hypertension 09/24/2015  . Chronic kidney disease (CKD), stage III (moderate) 09/24/2015  . Colitis 09/24/2015  . Esophagitis, reflux 09/24/2015  . History of cardioembolic cerebrovascular accident (CVA) 09/24/2015  . Decreased potassium in the blood 09/24/2015  . History of malignant melanoma of skin 09/24/2015  . Obesity 09/24/2015  . Cerebrovascular disease 09/24/2015  . Hypercholesteremia 08/17/2015    Angela Platner PT, DPT 10/26/2016, 4:53 PM  [place] MAIN [facility] SERVICES [address] [place], [place],  [address] Phone: [phone]   Fax:  [phone]  Name: Leomia Blake MRN: [id number] Date of Birth: 1932/08/02

## 2016-10-27 ENCOUNTER — Encounter: Payer: Self-pay | Admitting: Physical Therapy

## 2016-10-27 ENCOUNTER — Ambulatory Visit: Payer: Medicare Other | Admitting: Physical Therapy

## 2016-10-27 DIAGNOSIS — R2681 Unsteadiness on feet: Secondary | ICD-10-CM | POA: Diagnosis not present

## 2016-10-27 DIAGNOSIS — R279 Unspecified lack of coordination: Secondary | ICD-10-CM

## 2016-10-27 DIAGNOSIS — M6281 Muscle weakness (generalized): Secondary | ICD-10-CM

## 2016-10-27 NOTE — Therapy (Signed)
[place] MAIN [facility] SERVICES [address] [place], [place], [address] Phone: [phone]   Fax:  [phone]  Physical Therapy Treatment  Patient Details  Name: Susan Campos MRN: [id number] Date of Birth: 27-Nov-1932 Referring Provider: Dr. Jennings Books  Encounter Date: 10/27/2016      PT End of Session - 10/27/16 1721    Visit Number 20   Number of Visits 36   Date for PT Re-Evaluation 11/08/16   Authorization Type gcode 10   Authorization Time Period 10   PT Start Time 1648   PT Stop Time 1730   PT Time Calculation (min) 42 min   Equipment Utilized During Treatment Gait belt   Activity Tolerance No increased pain;Patient tolerated treatment well   Behavior During Therapy [facility] for tasks assessed/performed      Past Medical History:  Diagnosis Date  . Arthritis    HIPS  . Asthma    HX OF  . Cancer ([place])    skin  . Orthopnea    sleeps in recliner, cannot lay flat on her back  . Stroke [facility])    CVA 2012/ MEMORY LOSS, 1999, 1 more in 2011  . Vertigo    OCCASIONAL    Past Surgical History:  Procedure Laterality Date  . CATARACT EXTRACTION Right   . CATARACT EXTRACTION W/PHACO Left 05/27/2015   Procedure: CATARACT EXTRACTION PHACO AND INTRAOCULAR LENS PLACEMENT (IOC);  Surgeon: Leandrew Koyanagi, MD;  Location: [place];  Service: Ophthalmology;  Laterality: Left;  . KNEE ARTHROSCOPY    . MELANOMA EXCISION     from chest  . MOUTH SURGERY    . REPLACEMENT TOTAL KNEE BILATERAL    . UTERINE SUSPENSION      There were no vitals filed for this visit.      Subjective Assessment - 10/27/16 1720    Subjective Patient reports doing okay; She reports having a good class this morning with quilting; She reports being too tired to try the electric slide and therefore tried mental practice;    Pertinent History personal factors affecting rehab: age, history of CVA, chronic condition, arthritis, CKD stage III,  impaired memory   Limitations Walking;Other (comment)  balance, RUE stiffness, coordination;    Diagnostic tests MRI shows multiple old small lacunar infarcts;    Patient Stated Goals Improve balance and endurance; improve RUE joint mobility for fine motor tasks;    Currently in Pain? No/denies          TREATMENT: Warm up on Nustep BUE/BLE level 2 x4 min (unbilled);  NMR: Instructed patient in electric slide dance with all steps side/side, backward/forward and step kick out to side for 90 degree turn; She required 1 HHA and max VCs for sequencing, step technique, weight shift and positioning x81mn; Patient able to negotiate turn well but hesitates when stepping to side to continue with side stepping. She requires cues for coordination with multiple steps and to improve sequencing for better progression through dance. She would benefit from additional instruction with steps to achieve goal of doing electric slide for personal benefit; This dance step challenges patient's coordination, strength, balance and flexibility as well as cognition with learning new step;   Instructed patient in UE strengthening: RUE: Wrist flexion/extension 3# 2x15 with cues to avoid elbow movement and isolate wrist movement for strengthening; Wrist circles, clockwise/counterclockwise 3# x10 with mod VCs for ROM to improve wrist flexibility;  RUE velcro turns: Small key pinch, large key pinch, small dowel,  large dowel x1 each with cues to improve supination for better wrist mobility;   Patient required min-moderate verbal/tactile cues for correct exercise technique.                           PT Education - 11/21/2016 1721    Education provided Yes   Education Details electric slide, dynamic balance, strengthening;    Person(s) Educated Patient   Methods Explanation;Verbal cues   Comprehension Verbalized understanding;Returned demonstration;Verbal cues required              PT Long Term Goals - 10/11/16 1640      PT LONG TERM GOAL #1   Title Patient will be independent in home exercise program to improve strength/mobility for better functional independence with ADLs.   Time 4   Period Weeks   Status On-going     PT LONG TERM GOAL #2   Title pt will improve berg balance score by 6 pts to reduce risk of falls .   Time 4   Period Weeks   Status Partially Met     PT LONG TERM GOAL #3   Title pt will reduce 5x sit to stand time to < 15 s demosntrating improved LE strength.    Time 4   Period Weeks   Status Achieved     PT LONG TERM GOAL #4   Title Patient will increase BUE and BLE gross strength to 4+/5 as to improve functional strength for independent gait, increased standing tolerance and increased ADL ability.   Time 4   Period Weeks   Status Partially Met     PT LONG TERM GOAL #5   Title  Patient will decrease Quick DASH score by > 8 points demonstrating reduced self-reported upper extremity disability.   Time 4   Period Weeks   Status Achieved               Plan - November 21, 2016 1724    Clinical Impression Statement Patient demonstrates better fluidity with dynamic steps when stepping out and together versus backwards crossover. She continues to fatigue with prolonged standing. Patient instructed in RUE ROM and strengthening exercise. She would benefit from additional skilled PT intervention to improve ROM/strength and improve gait safety and dynamic balance;    Rehab Potential Good   Clinical Impairments Affecting Rehab Potential positive indicator: good PLOF, good response from prior therapy; negative: chronic condition, co-morbidities; Patient's clinical presentation is evolving as she is experiencing an exacerbation of impaired balance and weakness;    PT Frequency 2x / week   PT Duration 4 weeks   PT Treatment/Interventions Cryotherapy;Electrical Stimulation;Gait training;Moist Heat;Stair training;Functional mobility training;Therapeutic  activities;Therapeutic exercise;Balance training;Neuromuscular re-education;Manual techniques;Patient/family education   PT Next Visit Plan work on balance/strengthening;    [place] continue as given;    Consulted and Agree with Plan of Care Patient      Patient will benefit from skilled therapeutic intervention in order to improve the following deficits and impairments:  Abnormal gait, Decreased mobility, Decreased coordination, Decreased activity tolerance, Decreased endurance, Decreased strength, Impaired flexibility, Difficulty walking, Decreased safety awareness, Decreased balance  Visit Diagnosis: Unsteadiness on feet  Muscle weakness (generalized)  Unspecified lack of coordination       G-Codes - 21-Nov-2016 1727    Functional Assessment Tool Used (Outpatient Only) Clinical judgement, strength/balance;    Functional Limitation Mobility: Walking and moving around   Mobility: Walking and Moving Around Current Status [phone])  At least 20 percent but less than 40 percent impaired, limited or restricted   Mobility: Walking and Moving Around Goal Status [phone]) At least 1 percent but less than 20 percent impaired, limited or restricted      Problem List Patient Active Problem List   Diagnosis Date Noted  . Dyslipidemia 07/22/2016  . Mixed Alzheimer's and vascular dementia 05/25/2016  . Obesity (BMI 30.0-34.9) 05/25/2016  . Late effects of CVA (cerebrovascular accident) 05/05/2016  . Ataxia 04/20/2016  . Osteopenia 01/09/2016  . Arthritis 09/24/2015  . Airway hyperreactivity 09/24/2015  . Benign hypertension 09/24/2015  . Chronic kidney disease (CKD), stage III (moderate) 09/24/2015  . Colitis 09/24/2015  . Esophagitis, reflux 09/24/2015  . History of cardioembolic cerebrovascular accident (CVA) 09/24/2015  . Decreased potassium in the blood 09/24/2015  . History of malignant melanoma of skin 09/24/2015  . Obesity 09/24/2015  . Cerebrovascular disease  09/24/2015  . Hypercholesteremia 08/17/2015    Trotter,Margaret PT, DPT 10/27/2016, 6:38 PM  [place] MAIN [facility] SERVICES [address] [place], [place], [address] Phone: [phone]   Fax:  [phone]  Name: Marypat Kimmet MRN: [id number] Date of Birth: 1932-10-10

## 2016-11-01 ENCOUNTER — Encounter: Payer: Self-pay | Admitting: Physical Therapy

## 2016-11-01 ENCOUNTER — Ambulatory Visit: Payer: Medicare Other | Attending: Neurology | Admitting: Physical Therapy

## 2016-11-01 DIAGNOSIS — R2681 Unsteadiness on feet: Secondary | ICD-10-CM | POA: Diagnosis present

## 2016-11-01 DIAGNOSIS — M6281 Muscle weakness (generalized): Secondary | ICD-10-CM | POA: Insufficient documentation

## 2016-11-01 DIAGNOSIS — R279 Unspecified lack of coordination: Secondary | ICD-10-CM | POA: Insufficient documentation

## 2016-11-01 NOTE — Therapy (Signed)
[place] MAIN [facility] SERVICES [address] [place], [place], [address] Phone: [phone]   Fax:  [phone]  Physical Therapy Treatment  Patient Details  Name: Susan Campos MRN: [id number] Date of Birth: 1933-02-02 Referring Provider: Dr. Jennings Books  Encounter Date: 11/01/2016      PT End of Session - 11/01/16 1738    Visit Number 21   Number of Visits 36   Date for PT Re-Evaluation 11/08/16   Authorization Type gcode 1   Authorization Time Period 10   PT Start Time 2836   PT Stop Time 1800   PT Time Calculation (min) 45 min   Activity Tolerance No increased pain;Patient tolerated treatment well   Behavior During Therapy [facility] for tasks assessed/performed      Past Medical History:  Diagnosis Date  . Arthritis    HIPS  . Asthma    HX OF  . Cancer ([place])    skin  . Orthopnea    sleeps in recliner, cannot lay flat on her back  . Stroke [facility])    CVA 2012/ MEMORY LOSS, 1999, 1 more in 2011  . Vertigo    OCCASIONAL    Past Surgical History:  Procedure Laterality Date  . CATARACT EXTRACTION Right   . CATARACT EXTRACTION W/PHACO Left 05/27/2015   Procedure: CATARACT EXTRACTION PHACO AND INTRAOCULAR LENS PLACEMENT (IOC);  Surgeon: Leandrew Koyanagi, MD;  Location: [place];  Service: Ophthalmology;  Laterality: Left;  . KNEE ARTHROSCOPY    . MELANOMA EXCISION     from chest  . MOUTH SURGERY    . REPLACEMENT TOTAL KNEE BILATERAL    . UTERINE SUSPENSION      There were no vitals filed for this visit.      Subjective Assessment - 11/01/16 1722    Subjective Patient reports doing well; She is a little tired; She has been busy doing yardwork and moving around; She reports having more muscles in her legs and is pleased;    Pertinent History personal factors affecting rehab: age, history of CVA, chronic condition, arthritis, CKD stage III, impaired memory   Limitations Walking;Other (comment)  balance,  RUE stiffness, coordination;    Diagnostic tests MRI shows multiple old small lacunar infarcts;    Patient Stated Goals Improve balance and endurance; improve RUE joint mobility for fine motor tasks;    Currently in Pain? No/denies         TREATMENT: Warm up on Nustep BUE/BLE level 2 x4 min (unbilled);  NMR: Instructed patient in electric slide dance with all steps side/side, backward/forward and step kick out to side for 90 degree turn; She required NO HHA and max VCs for sequencing, step technique, weight shift and positioning x69mn; Patient able to negotiate turn well but hesitates when stepping to side to continue with side stepping. She was able to progress today with better fluidity to right side after turn/transition but then hesitated to left side; She would benefit from additional instruction with steps to achieve goal of doing electric slide for personal benefit; This dance step challenges patient's coordination, strength, balance and flexibility as well as cognition with learning new step;   Standing: Green tband around both legs: Hip abduction x15 bilaterally; Hip extension x15 bilaterally; Side stepping x10 feet x2 laps each direction Patient required min-moderate verbal/tactile cues for correct exercise technique including cues to increase ROM for better strengthening;   Instructed patient in UE strengthening: RUE velcro turns: Small key pinch, large key  pinch, small dowel, large dowel x1 each with cues to improve supination for better wrist mobility;  RUE pegs (orange, green, yellow, red) #4 each with 25# gripper to facilitate UE strengthening; BUE shuffling cards x3 with cues for hand placement to improve card movement;   Patient required min-moderate verbal/tactile cues for correct exercise technique. She reports increased fatigue at end of treatment session;                          PT Education - 11/01/16 1723    Education provided Yes    Education Details electric slide, balance, strengthening;    Person(s) Educated Patient   Methods Explanation;Verbal cues   Comprehension Verbalized understanding;Returned demonstration;Verbal cues required             PT Long Term Goals - 10/11/16 1640      PT LONG TERM GOAL #1   Title Patient will be independent in home exercise program to improve strength/mobility for better functional independence with ADLs.   Time 4   Period Weeks   Status On-going     PT LONG TERM GOAL #2   Title pt will improve berg balance score by 6 pts to reduce risk of falls .   Time 4   Period Weeks   Status Partially Met     PT LONG TERM GOAL #3   Title pt will reduce 5x sit to stand time to < 15 s demosntrating improved LE strength.    Time 4   Period Weeks   Status Achieved     PT LONG TERM GOAL #4   Title Patient will increase BUE and BLE gross strength to 4+/5 as to improve functional strength for independent gait, increased standing tolerance and increased ADL ability.   Time 4   Period Weeks   Status Partially Met     PT LONG TERM GOAL #5   Title  Patient will decrease Quick DASH score by > 8 points demonstrating reduced self-reported upper extremity disability.   Time 4   Period Weeks   Status Achieved               Plan - 11/01/16 1738    Clinical Impression Statement Patient instructed in electric slide dynamic activity; She was able to demonstrate better fluidity and coordination with transitions. She was instructed in LE and UE strengthening; She requires min VCs for correct exercise technique; She would benefit from additional skilled PT intervention to improve strength, balance and gait safety;    Rehab Potential Good   Clinical Impairments Affecting Rehab Potential positive indicator: good PLOF, good response from prior therapy; negative: chronic condition, co-morbidities; Patient's clinical presentation is evolving as she is experiencing an exacerbation of impaired  balance and weakness;    PT Frequency 2x / week   PT Duration 4 weeks   PT Treatment/Interventions Cryotherapy;Electrical Stimulation;Gait training;Moist Heat;Stair training;Functional mobility training;Therapeutic activities;Therapeutic exercise;Balance training;Neuromuscular re-education;Manual techniques;Patient/family education   PT Next Visit Plan work on balance/strengthening;    [place] continue as given;    Consulted and Agree with Plan of Care Patient      Patient will benefit from skilled therapeutic intervention in order to improve the following deficits and impairments:  Abnormal gait, Decreased mobility, Decreased coordination, Decreased activity tolerance, Decreased endurance, Decreased strength, Impaired flexibility, Difficulty walking, Decreased safety awareness, Decreased balance  Visit Diagnosis: Unsteadiness on feet  Muscle weakness (generalized)  Unspecified lack of coordination  Problem List Patient Active Problem List   Diagnosis Date Noted  . Dyslipidemia 07/22/2016  . Mixed Alzheimer's and vascular dementia 05/25/2016  . Obesity (BMI 30.0-34.9) 05/25/2016  . Late effects of CVA (cerebrovascular accident) 05/05/2016  . Ataxia 04/20/2016  . Osteopenia 01/09/2016  . Arthritis 09/24/2015  . Airway hyperreactivity 09/24/2015  . Benign hypertension 09/24/2015  . Chronic kidney disease (CKD), stage III (moderate) 09/24/2015  . Colitis 09/24/2015  . Esophagitis, reflux 09/24/2015  . History of cardioembolic cerebrovascular accident (CVA) 09/24/2015  . Decreased potassium in the blood 09/24/2015  . History of malignant melanoma of skin 09/24/2015  . Obesity 09/24/2015  . Cerebrovascular disease 09/24/2015  . Hypercholesteremia 08/17/2015    Maudine Kluesner PT, DPT 11/01/2016, 5:59 PM  [place] MAIN [facility] SERVICES [address] [place], [place], [address] Phone: [phone]   Fax:   [phone]  Name: Susan Campos MRN: [id number] Date of Birth: 15-Nov-1932

## 2016-11-02 ENCOUNTER — Encounter: Payer: Self-pay | Admitting: Physical Therapy

## 2016-11-02 ENCOUNTER — Ambulatory Visit: Payer: Medicare Other | Admitting: Physical Therapy

## 2016-11-02 DIAGNOSIS — R279 Unspecified lack of coordination: Secondary | ICD-10-CM

## 2016-11-02 DIAGNOSIS — M6281 Muscle weakness (generalized): Secondary | ICD-10-CM

## 2016-11-02 DIAGNOSIS — R2681 Unsteadiness on feet: Secondary | ICD-10-CM | POA: Diagnosis not present

## 2016-11-02 NOTE — Therapy (Signed)
[place] MAIN [facility] SERVICES [address] [place], [place], [address] Phone: [phone]   Fax:  [phone]  Physical Therapy Treatment  Patient Details  Name: Susan Campos MRN: [id number] Date of Birth: April 29, 1933 Referring Provider: Dr. Jennings Books  Encounter Date: 11/02/2016      PT End of Session - 11/02/16 1654    Visit Number 22   Number of Visits 36   Date for PT Re-Evaluation 11/08/16   Authorization Type gcode 2   Authorization Time Period 10   PT Start Time 1600   PT Stop Time 1643   PT Time Calculation (min) 43 min   Activity Tolerance No increased pain;Patient tolerated treatment well   Behavior During Therapy [facility] for tasks assessed/performed      Past Medical History:  Diagnosis Date  . Arthritis    HIPS  . Asthma    HX OF  . Cancer ([place])    skin  . Orthopnea    sleeps in recliner, cannot lay flat on her back  . Stroke [facility])    CVA 2012/ MEMORY LOSS, 1999, 1 more in 2011  . Vertigo    OCCASIONAL    Past Surgical History:  Procedure Laterality Date  . CATARACT EXTRACTION Right   . CATARACT EXTRACTION W/PHACO Left 05/27/2015   Procedure: CATARACT EXTRACTION PHACO AND INTRAOCULAR LENS PLACEMENT (IOC);  Surgeon: Leandrew Koyanagi, MD;  Location: [place];  Service: Ophthalmology;  Laterality: Left;  . KNEE ARTHROSCOPY    . MELANOMA EXCISION     from chest  . MOUTH SURGERY    . REPLACEMENT TOTAL KNEE BILATERAL    . UTERINE SUSPENSION      There were no vitals filed for this visit.      Subjective Assessment - 11/02/16 1653    Subjective Patient reports that she is doing well; She went to exercise class just before therapy so she is a little tired; Denies any pain;    Pertinent History personal factors affecting rehab: age, history of CVA, chronic condition, arthritis, CKD stage III, impaired memory   Limitations Walking;Other (comment)  balance, RUE stiffness, coordination;     Diagnostic tests MRI shows multiple old small lacunar infarcts;    Patient Stated Goals Improve balance and endurance; improve RUE joint mobility for fine motor tasks;    Currently in Pain? No/denies          TREATMENT: Warm up on Nustep BUE/BLE level 2 x4 min (unbilled);  NMR: Instructed patient in electric slide dance with all steps side/side, backward/forward and step kick out to side for 90 degree turn; She required NO HHA and mod VCs for sequencing, step technique, weight shift and positioning x63mn; Patient able to negotiate turn well but hesitates when stepping to side to continue with side stepping. She was able to progress today with better fluidity to right side after turn/transition but then hesitated to left side; She was instructed with music to increase tempo and increase cardiovascular challenge; She would benefit from additional instruction with steps to achieve goal of doing electric slide for personal benefit; This dance step challenges patient's coordination, strength, balance and flexibility as well as cognition with learning new step;   Patient expressed excitement and satisfaction over learning dynamic steps;  Strengthening: Sit<>Stand from chair x10 unsupported with cues for forward weight shift; Patient re-educated in HEP with cues to keep doing tband exercise and putty exercise for hand to improve strength;  PT Education - 11/02/16 1653    Education provided Yes   Education Details electric slide, HEP reinforced, strengthening;    Person(s) Educated Patient   Methods Explanation;Verbal cues   Comprehension Verbalized understanding;Returned demonstration;Verbal cues required             PT Long Term Goals - 10/11/16 1640      PT LONG TERM GOAL #1   Title Patient will be independent in home exercise program to improve strength/mobility for better functional independence with ADLs.   Time 4   Period Weeks    Status On-going     PT LONG TERM GOAL #2   Title pt will improve berg balance score by 6 pts to reduce risk of falls .   Time 4   Period Weeks   Status Partially Met     PT LONG TERM GOAL #3   Title pt will reduce 5x sit to stand time to < 15 s demosntrating improved LE strength.    Time 4   Period Weeks   Status Achieved     PT LONG TERM GOAL #4   Title Patient will increase BUE and BLE gross strength to 4+/5 as to improve functional strength for independent gait, increased standing tolerance and increased ADL ability.   Time 4   Period Weeks   Status Partially Met     PT LONG TERM GOAL #5   Title  Patient will decrease Quick DASH score by > 8 points demonstrating reduced self-reported upper extremity disability.   Time 4   Period Weeks   Status Achieved               Plan - 11/02/16 1654    Clinical Impression Statement Patient instructed in electric slide dynamic balance/coordination exercise. Instructed patient to music for increased tempo and cardiovascular exercise. She fatigues after 3-4 min requiring seated rest break with increased shortness of breath; Patient able to demonstrate better fluidity of movement, better coordination and step progression. She reports increased fatigue after treatment session; She would benefit from additional skilled PT intervention to improve strength, balance and gait safety;    Rehab Potential Good   Clinical Impairments Affecting Rehab Potential positive indicator: good PLOF, good response from prior therapy; negative: chronic condition, co-morbidities; Patient's clinical presentation is evolving as she is experiencing an exacerbation of impaired balance and weakness;    PT Frequency 2x / week   PT Duration 4 weeks   PT Treatment/Interventions Cryotherapy;Electrical Stimulation;Gait training;Moist Heat;Stair training;Functional mobility training;Therapeutic activities;Therapeutic exercise;Balance training;Neuromuscular  re-education;Manual techniques;Patient/family education   PT Next Visit Plan work on balance/strengthening;    [place] continue as given;    Consulted and Agree with Plan of Care Patient      Patient will benefit from skilled therapeutic intervention in order to improve the following deficits and impairments:  Abnormal gait, Decreased mobility, Decreased coordination, Decreased activity tolerance, Decreased endurance, Decreased strength, Impaired flexibility, Difficulty walking, Decreased safety awareness, Decreased balance  Visit Diagnosis: Unsteadiness on feet  Muscle weakness (generalized)  Unspecified lack of coordination     Problem List Patient Active Problem List   Diagnosis Date Noted  . Dyslipidemia 07/22/2016  . Mixed Alzheimer's and vascular dementia 05/25/2016  . Obesity (BMI 30.0-34.9) 05/25/2016  . Late effects of CVA (cerebrovascular accident) 05/05/2016  . Ataxia 04/20/2016  . Osteopenia 01/09/2016  . Arthritis 09/24/2015  . Airway hyperreactivity 09/24/2015  . Benign hypertension 09/24/2015  . Chronic kidney disease (CKD), stage III (moderate)  09/24/2015  . Colitis 09/24/2015  . Esophagitis, reflux 09/24/2015  . History of cardioembolic cerebrovascular accident (CVA) 09/24/2015  . Decreased potassium in the blood 09/24/2015  . History of malignant melanoma of skin 09/24/2015  . Obesity 09/24/2015  . Cerebrovascular disease 09/24/2015  . Hypercholesteremia 08/17/2015    Aydn Ferrara PT, DPT 11/02/2016, 4:56 PM  [place] MAIN [facility] SERVICES [address] [place], [place], [address] Phone: [phone]   Fax:  [phone]  Name: Susan Campos MRN: [id number] Date of Birth: 1932/11/25

## 2016-11-07 ENCOUNTER — Encounter: Payer: Self-pay | Admitting: Physical Therapy

## 2016-11-07 ENCOUNTER — Ambulatory Visit: Payer: Medicare Other | Admitting: Physical Therapy

## 2016-11-07 DIAGNOSIS — R2681 Unsteadiness on feet: Secondary | ICD-10-CM

## 2016-11-07 DIAGNOSIS — R279 Unspecified lack of coordination: Secondary | ICD-10-CM

## 2016-11-07 DIAGNOSIS — M6281 Muscle weakness (generalized): Secondary | ICD-10-CM

## 2016-11-07 NOTE — Therapy (Signed)
[place] MAIN [facility] SERVICES [address] [place], [place], [address] Phone: [phone]   Fax:  [phone]  Physical Therapy Treatment/Discharge Summary  Patient Details  Name: Susan Campos MRN: [id number] Date of Birth: 09-15-1932 Referring Provider: Dr. Jennings Books  Encounter Date: 11/07/2016      PT End of Session - 11/07/16 1358    Visit Number 23   Number of Visits 36   Date for PT Re-Evaluation 11/08/16   Authorization Type gcode 3   Authorization Time Period 10   PT Start Time 1345   PT Stop Time 1430   PT Time Calculation (min) 45 min   Activity Tolerance No increased pain;Patient tolerated treatment well   Behavior During Therapy [facility] for tasks assessed/performed      Past Medical History:  Diagnosis Date  . Arthritis    HIPS  . Asthma    HX OF  . Cancer ([place])    skin  . Orthopnea    sleeps in recliner, cannot lay flat on her back  . Stroke [facility])    CVA 2012/ MEMORY LOSS, 1999, 1 more in 2011  . Vertigo    OCCASIONAL    Past Surgical History:  Procedure Laterality Date  . CATARACT EXTRACTION Right   . CATARACT EXTRACTION W/PHACO Left 05/27/2015   Procedure: CATARACT EXTRACTION PHACO AND INTRAOCULAR LENS PLACEMENT (IOC);  Surgeon: Leandrew Koyanagi, MD;  Location: [place];  Service: Ophthalmology;  Laterality: Left;  . KNEE ARTHROSCOPY    . MELANOMA EXCISION     from chest  . MOUTH SURGERY    . REPLACEMENT TOTAL KNEE BILATERAL    . UTERINE SUSPENSION      There were no vitals filed for this visit.      Subjective Assessment - 11/07/16 1356    Subjective Patient reports doing well; She reports walking in church Sunday without her cane; she did have some soreness in her forearm over the weekend but states that it is better today; She reports being pleased with her muscle growth;    Pertinent History personal factors affecting rehab: age, history of CVA, chronic condition, arthritis,  CKD stage III, impaired memory   Limitations Walking;Other (comment)  balance, RUE stiffness, coordination;    Diagnostic tests MRI shows multiple old small lacunar infarcts;    Patient Stated Goals Improve balance and endurance; improve RUE joint mobility for fine motor tasks;    Currently in Pain? No/denies            [facility] PT Assessment - 11/07/16 0001      Observation/Other Assessments   Quick DASH  18% (the lower the score the less disability, improved from 06/29/16 which was 34%)     Strength   Right Hand Grip (lbs) 55  improved from 45 in Jan 2018   Left Hand Grip (lbs) 50  improved from 40 in Jan 2018     Standardized Balance Assessment   Five times sit to stand comments  9 sec without HHA (low fall risk, improved from 06/29/16 which was 18.6 sec)   10 Meter Walk 1.05 m/s without AD (community ambulator, improved from 06/29/16 which was 0.833 m/s)     Merrilee Jansky Balance Test   Sit to Stand Able to stand without using hands and stabilize independently   Standing Unsupported Able to stand safely 2 minutes   Sitting with Back Unsupported but Feet Supported on Floor or Stool Able to sit safely and securely 2 minutes  Stand to Sit Sits safely with minimal use of hands   Transfers Able to transfer safely, minor use of hands   Standing Unsupported with Eyes Closed Able to stand 10 seconds safely   Standing Ubsupported with Feet Together Able to place feet together independently and stand 1 minute safely   From Standing, Reach Forward with Outstretched Arm Can reach confidently >25 cm (10")   From Standing Position, Pick up Object from Floor Able to pick up shoe safely and easily   From Standing Position, Turn to Look Behind Over each Shoulder Looks behind from both sides and weight shifts well   Turn 360 Degrees Able to turn 360 degrees safely in 4 seconds or less   Standing Unsupported, Alternately Place Feet on Step/Stool Able to stand independently and safely and complete 8  steps in 20 seconds   Standing Unsupported, One Foot in Front Able to plae foot ahead of the other independently and hold 30 seconds   Standing on One Leg Able to lift leg independently and hold equal to or more than 3 seconds   Total Score 53   Berg comment: <50% risk for falls, improved from 08/10/16 which was 49/56       TREATMENT: PT instructed patient in quick dash, 10 meter walk, Berg Balance Assessment, etc to address goals. See above; She requires min Vcs for correct exercise technique; She was able to demonstrate significant improvement in most outcomes.   Instructed patient in electric slide dance with all steps side/side, backward/forward and step kick out to side for 90 degree turn; She required [facility] and mod VCs for sequencing, step technique, weight shift and positioning x30mn; Patient able to negotiate turn well but hesitates when stepping to side to continue with side stepping. She was able to progress today with better fluidity to right side after turn/transition but then hesitated to left side; She was instructed with music to increase tempo and increase cardiovascular challenge; She would benefit from additional instruction with steps to achieve goal of doing electric slide for personal benefit; This dance step challenges patient's coordination, strength, balance and flexibility as well as cognition with learning new step;   Patient expressed excitement and satisfaction over learning dynamic steps;                     PT Education - 11/07/16 1357    Education provided Yes   Education Details electric slide, progress towards goals;    Person(s) Educated Patient   Methods Explanation;Verbal cues   Comprehension Verbalized understanding;Returned demonstration;Verbal cues required             PT Long Term Goals - 11/07/16 1358      PT LONG TERM GOAL #1   Title Patient will be independent in home exercise program to improve strength/mobility for  better functional independence with ADLs.   Time 4   Period Weeks   Status Achieved     PT LONG TERM GOAL #2   Title pt will improve berg balance score by 6 pts to reduce risk of falls .   Time 4   Period Weeks   Status Achieved     PT LONG TERM GOAL #3   Title pt will reduce 5x sit to stand time to < 15 s demosntrating improved LE strength.    Time 4   Period Weeks   Status Achieved     PT LONG TERM GOAL #4   Title Patient will increase BUE and  BLE gross strength to 4+/5 as to improve functional strength for independent gait, increased standing tolerance and increased ADL ability.   Time 4   Period Weeks   Status Achieved     PT LONG TERM GOAL #5   Title  Patient will decrease Quick DASH score by > 8 points demonstrating reduced self-reported upper extremity disability.   Time 4   Period Weeks   Status Achieved               Plan - 11/27/2016 1620    Clinical Impression Statement Patient instructed in outcome measures to assess progress towards goals. She has made significant improvement in gait speed, transfer ability and balance. Patient reports that she is able to walk more without her SPC. She denies any recent falls and reports feeling more stable with activities. She demonstrates better dynamic balance and coordination with being able to do electric slide line dance. Patient has met most goals. she is appropriate for discharge at this time. She expressed that she will continue with HEP and with forever fit classes (supervised exercise).    Rehab Potential Good   Clinical Impairments Affecting Rehab Potential positive indicator: good PLOF, good response from prior therapy; negative: chronic condition, co-morbidities; Patient's clinical presentation is evolving as she is experiencing an exacerbation of impaired balance and weakness;    PT Frequency 2x / week   PT Duration 4 weeks   PT Treatment/Interventions Cryotherapy;Electrical Stimulation;Gait training;Moist  Heat;Stair training;Functional mobility training;Therapeutic activities;Therapeutic exercise;Balance training;Neuromuscular re-education;Manual techniques;Patient/family education   PT Next Visit Plan work on balance/strengthening;    [place] continue as given;    Consulted and Agree with Plan of Care Patient      Patient will benefit from skilled therapeutic intervention in order to improve the following deficits and impairments:  Abnormal gait, Decreased mobility, Decreased coordination, Decreased activity tolerance, Decreased endurance, Decreased strength, Impaired flexibility, Difficulty walking, Decreased safety awareness, Decreased balance  Visit Diagnosis: Unsteadiness on feet  Muscle weakness (generalized)  Unspecified lack of coordination       G-Codes - Nov 27, 2016 1622    Functional Assessment Tool Used (Outpatient Only) Clinical judgement, strength/balance;    Functional Limitation Mobility: Walking and moving around   Mobility: Walking and Moving Around Goal Status [phone]) At least 1 percent but less than 20 percent impaired, limited or restricted   Mobility: Walking and Moving Around Discharge Status [phone]) At least 1 percent but less than 20 percent impaired, limited or restricted      Problem List Patient Active Problem List   Diagnosis Date Noted  . Dyslipidemia 07/22/2016  . Mixed Alzheimer's and vascular dementia 05/25/2016  . Obesity (BMI 30.0-34.9) 05/25/2016  . Late effects of CVA (cerebrovascular accident) 05/05/2016  . Ataxia 04/20/2016  . Osteopenia 01/09/2016  . Arthritis 09/24/2015  . Airway hyperreactivity 09/24/2015  . Benign hypertension 09/24/2015  . Chronic kidney disease (CKD), stage III (moderate) 09/24/2015  . Colitis 09/24/2015  . Esophagitis, reflux 09/24/2015  . History of cardioembolic cerebrovascular accident (CVA) 09/24/2015  . Decreased potassium in the blood 09/24/2015  . History of malignant melanoma of skin  09/24/2015  . Obesity 09/24/2015  . Cerebrovascular disease 09/24/2015  . Hypercholesteremia 08/17/2015    Artemis Loyal PT, DPT 11-27-16, 4:23 PM  [place] MAIN [facility] SERVICES [address] [place], [place], [address] Phone: [phone]   Fax:  [phone]  Name: Susan Campos MRN: [id number] Date of Birth: 06/04/1933

## 2016-12-23 ENCOUNTER — Ambulatory Visit (INDEPENDENT_AMBULATORY_CARE_PROVIDER_SITE_OTHER): Payer: Medicare Other | Admitting: Family Medicine

## 2016-12-23 ENCOUNTER — Encounter: Payer: Self-pay | Admitting: Family Medicine

## 2016-12-23 VITALS — BP 172/92 | HR 79 | Temp 98.2°F | Wt 191.0 lb

## 2016-12-23 DIAGNOSIS — E7800 Pure hypercholesterolemia, unspecified: Secondary | ICD-10-CM

## 2016-12-23 DIAGNOSIS — I10 Essential (primary) hypertension: Secondary | ICD-10-CM | POA: Diagnosis not present

## 2016-12-23 DIAGNOSIS — N183 Chronic kidney disease, stage 3 unspecified: Secondary | ICD-10-CM

## 2016-12-23 DIAGNOSIS — R5383 Other fatigue: Secondary | ICD-10-CM

## 2016-12-23 NOTE — Progress Notes (Signed)
Patient: Susan Campos Female    DOB: Jun 17, 1933   83 y.o.   MRN: [id number] Visit Date: 12/23/2016  Today's Provider: Lelon Huh, MD   Chief Complaint  Patient presents with  . Hypertension    follow up  . Hyperlipidemia    follow up   Subjective:    HPI  Hypertension, follow-up:  BP Readings from Last 3 Encounters:  07/22/16 120/70  04/20/16 132/60  01/20/16 140/66    She was last seen for hypertension 5 months ago.  BP at that visit was 120/70. Management since that visit includes no changes. She reports good compliance with treatment. She is not having side effects.  She is exercising. She is adherent to low salt diet.   Outside blood pressures are normal per patient report. She is experiencing fatigue.  Patient denies chest pain, chest pressure/discomfort, claudication, dyspnea, exertional chest pressure/discomfort, irregular heart beat, lower extremity edema, near-syncope, orthopnea, palpitations, paroxysmal nocturnal dyspnea, syncope and tachypnea.   Cardiovascular risk factors include advanced age (older than 75 for men, 39 for women), dyslipidemia and hypertension.  Use of agents associated with hypertension: NSAIDS.     Weight trend: decreasing steadily Wt Readings from Last 3 Encounters:  07/22/16 198 lb (89.8 kg)  04/20/16 196 lb (88.9 kg)  01/20/16 196 lb (88.9 kg)    Current diet: well balanced  ------------------------------------------------------------------------  Lipid/Cholesterol, Follow-up:   Last seen for this5 months ago.  Management changes since that visit include increasing Simvastatin to 20mg  daily. . Last Lipid Panel:    Component Value Date/Time   CHOL 194 07/22/2016 1211   TRIG 193 (H) 07/22/2016 1211   HDL 40 07/22/2016 1211   CHOLHDL 4.9 (H) 07/22/2016 1211   LDLCALC 115 (H) 07/22/2016 1211    Risk factors for vascular disease include hypercholesterolemia and hypertension  She reports good  compliance with treatment. She is not having side effects.  Current symptoms include none and have been stable. Weight trend: decreasing steadily Prior visit with dietician: no Current diet: well balanced Current exercise: stationary bike  Wt Readings from Last 3 Encounters:  07/22/16 198 lb (89.8 kg)  04/20/16 196 lb (88.9 kg)  01/20/16 196 lb (88.9 kg)    ------------------------------------------------------------------- Follow up Cerebrovascular Disease:   Patient was last seen for this problem 5 months ago and no changes were made.   She reports feeling much more fatigued than usual the last 2 weeks. Had few days of chills and aches earlier this month which have since resolved, but still feels very fatigued. Is eating well and sleeping well.     Allergies  Allergen Reactions  . Lovastatin Shortness Of Breath and Palpitations     Current Outpatient Prescriptions:  .  acetaminophen (TYLENOL ARTHRITIS PAIN) 650 MG CR tablet, Take by mouth., Disp: , Rfl:  .  albuterol (PROAIR HFA) 108 (90 Base) MCG/ACT inhaler, Inhale 2 puffs into the lungs every 6 (six) hours as needed for wheezing or shortness of breath., Disp: 18 g, Rfl: 1 .  aspirin EC 81 MG tablet, Take 81 mg by mouth daily. 9 am, Disp: , Rfl:  .  diazepam (VALIUM) 5 MG tablet, Take one tablet one hour prior to procedure, Disp: 30 tablet, Rfl: 1 .  FLUZONE HIGH-DOSE 0.5 ML SUSY, ADM 0.5ML IM UTD, Disp: , Rfl: 0 .  lisinopril (PRINIVIL,ZESTRIL) 5 MG tablet, TAKE 1 TABLET(5 MG) BY MOUTH DAILY, Disp: 30 tablet, Rfl: 12 .  MEGARED OMEGA-3 KRILL OIL  PO, Take 1 capsule by mouth daily., Disp: , Rfl:  .  memantine (NAMENDA) 5 MG tablet, Take 1 tablet by mouth 2 (two) times daily., Disp: , Rfl:  .  Multiple Vitamin (MULTIVITAMIN) capsule, Take 1 capsule by mouth daily. 9 AM, Disp: , Rfl:  .  simvastatin (ZOCOR) 20 MG tablet, Take 0.5 tablets (10 mg total) by mouth every evening., Disp: 30 tablet, Rfl: 4 .   triamterene-hydrochlorothiazide (DYAZIDE) 37.5-25 MG capsule, Take 1 each (1 capsule total) by mouth daily., Disp: 30 capsule, Rfl: 5  Review of Systems  Constitutional: Positive for fatigue. Negative for chills and fever.  HENT: Negative for congestion, ear pain, rhinorrhea, sneezing and sore throat.   Eyes: Negative.  Negative for pain and redness.  Respiratory: Negative for cough, shortness of breath and wheezing.   Cardiovascular: Negative for chest pain and leg swelling.  Gastrointestinal: Negative for abdominal pain, blood in stool, constipation, diarrhea and nausea.  Endocrine: Negative for polydipsia and polyphagia.  Genitourinary: Negative.  Negative for dysuria, flank pain, hematuria, pelvic pain, vaginal bleeding and vaginal discharge.  Musculoskeletal: Negative for arthralgias, back pain, gait problem and joint swelling.  Skin: Negative for rash.  Neurological: Negative.  Negative for dizziness, tremors, seizures, weakness, light-headedness, numbness and headaches.  Hematological: Negative for adenopathy.  Psychiatric/Behavioral: Negative.  Negative for behavioral problems, confusion and dysphoric mood. The patient is not nervous/anxious and is not hyperactive.     Social History  Substance Use Topics  . Smoking status: Former Smoker    Packs/day: 0.50    Years: 4.00    Types: Cigarettes  . Smokeless tobacco: Never Used  . Alcohol use No   Objective:   BP (!) 172/92 (BP Location: Left Arm, Patient Position: Sitting, Cuff Size: Large)   Pulse 79   Temp 98.2 F (36.8 C) (Oral)   Wt 191 lb (86.6 kg)   SpO2 95% Comment: room air  BMI 33.83 kg/m  There were no vitals filed for this visit.   Physical Exam   General Appearance:    Alert, cooperative, no distress  Eyes:    PERRL, conjunctiva/corneas clear, EOM's intact       Lungs:     Clear to auscultation bilaterally, respirations unlabored  Heart:    Regular rate and rhythm  Neurologic:   Awake, alert, oriented x  3. No apparent focal neurological           defect.      EKG: NSR.     Assessment & Plan:     1. Benign hypertension BP up today. Consider increase lisinopril after reviewing labs.  - EKG 12-Lead  2. Chronic kidney disease (CKD), stage III (moderate)   3. Hypercholesteremia She is tolerating simvastatin well with no adverse effects.   - Lipid panel - CK (Creatine Kinase)  4. Other fatigue  - CBC - Comprehensive metabolic panel - T4 AND TSH - CK (Creatine Kinase)       Lelon Huh, MD  [facility]

## 2016-12-24 LAB — COMPREHENSIVE METABOLIC PANEL
ALBUMIN: 4.4 g/dL (ref 3.5–4.7)
ALT: 17 IU/L (ref 0–32)
AST: 16 IU/L (ref 0–40)
Albumin/Globulin Ratio: 1.5 (ref 1.2–2.2)
Alkaline Phosphatase: 112 IU/L (ref 39–117)
BILIRUBIN TOTAL: 0.8 mg/dL (ref 0.0–1.2)
BUN/Creatinine Ratio: 16 (ref 12–28)
BUN: 16 mg/dL (ref 8–27)
CALCIUM: 9.6 mg/dL (ref 8.7–10.3)
CHLORIDE: 104 mmol/L (ref 96–106)
CO2: 24 mmol/L (ref 18–29)
CREATININE: 1.03 mg/dL — AB (ref 0.57–1.00)
GFR, EST AFRICAN AMERICAN: 59 mL/min/{1.73_m2} — AB (ref >59)
GFR, EST NON AFRICAN AMERICAN: 51 mL/min/{1.73_m2} — AB (ref >59)
GLUCOSE: 92 mg/dL (ref 65–99)
Globulin, Total: 2.9 g/dL (ref 1.5–4.5)
Potassium: 3.9 mmol/L (ref 3.5–5.2)
Sodium: 145 mmol/L — ABNORMAL HIGH (ref 134–144)
TOTAL PROTEIN: 7.3 g/dL (ref 6.0–8.5)

## 2016-12-24 LAB — LIPID PANEL
CHOL/HDL RATIO: 5.6 ratio — AB (ref 0.0–4.4)
Cholesterol, Total: 214 mg/dL — ABNORMAL HIGH (ref 100–199)
HDL: 38 mg/dL — AB (ref >39)
LDL CALC: 135 mg/dL — AB (ref 0–99)
Triglycerides: 203 mg/dL — ABNORMAL HIGH (ref 0–149)
VLDL CHOLESTEROL CAL: 41 mg/dL — AB (ref 5–40)

## 2016-12-24 LAB — CBC
HEMOGLOBIN: 14.2 g/dL (ref 11.1–15.9)
Hematocrit: 43 % (ref 34.0–46.6)
MCH: 29.2 pg (ref 26.6–33.0)
MCHC: 33 g/dL (ref 31.5–35.7)
MCV: 88 fL (ref 79–97)
Platelets: 197 10*3/uL (ref 150–379)
RBC: 4.87 x10E6/uL (ref 3.77–5.28)
RDW: 13.6 % (ref 12.3–15.4)
WBC: 6.9 10*3/uL (ref 3.4–10.8)

## 2016-12-24 LAB — T4 AND TSH
T4 TOTAL: 8.8 ug/dL (ref 4.5–12.0)
TSH: 1.19 u[IU]/mL (ref 0.450–4.500)

## 2016-12-24 LAB — CK: CK TOTAL: 73 U/L (ref 24–173)

## 2016-12-27 ENCOUNTER — Telehealth: Payer: Self-pay

## 2016-12-27 ENCOUNTER — Other Ambulatory Visit: Payer: Self-pay | Admitting: Family Medicine

## 2016-12-27 MED ORDER — LISINOPRIL 10 MG PO TABS: ORAL_TABLET | ORAL | 3 refills | Status: DC

## 2016-12-27 NOTE — Telephone Encounter (Signed)
-----   Message from Birdie Sons, MD sent at 12/24/2016  8:28 AM EDT ----- Cholesterol is up a little bit to 214. Otherwise labs are normal. Continue current medications.  Follow up 6 months.

## 2016-12-27 NOTE — Telephone Encounter (Deleted)
-----   Message from Birdie Sons, MD sent at 12/24/2016  8:28 AM EDT ----- Cholesterol is up a little bit to 214. Otherwise labs are normal. Continue current medications.  Follow up 6 months.

## 2016-12-27 NOTE — Telephone Encounter (Signed)
Unale to reach patient at this time, home line is busy will try again at a later time. KW

## 2016-12-27 NOTE — Telephone Encounter (Signed)
Also, please advise that I sent in new prescription for higher dose of lisinopril to [facility] since her BP was elevated, she should follow up in 3 months (not 6) for follow up of blood pressure.

## 2016-12-27 NOTE — Progress Notes (Signed)
Increase lisinopril from 5 to 10 due to uncontrolled hypertension.

## 2016-12-28 NOTE — Telephone Encounter (Signed)
Unable to reach pt or leave vm. Will try and later.

## 2016-12-28 NOTE — Telephone Encounter (Signed)
Patient advised.

## 2017-01-27 ENCOUNTER — Ambulatory Visit (INDEPENDENT_AMBULATORY_CARE_PROVIDER_SITE_OTHER): Payer: Medicare Other

## 2017-01-27 VITALS — BP 132/68 | HR 72 | Temp 98.4°F | Ht 63.0 in | Wt 192.6 lb

## 2017-01-27 DIAGNOSIS — Z0000 Encounter for general adult medical examination without abnormal findings: Secondary | ICD-10-CM

## 2017-01-27 NOTE — Progress Notes (Signed)
Subjective:   Susan Campos is a 83 y.o. female who presents for Medicare Annual (Subsequent) preventive examination.  Review of Systems:  N/A  Cardiac Risk Factors include: advanced age (>68men, >96 women);dyslipidemia;hypertension;obesity (BMI >30kg/m2)     Objective:     Vitals: BP 132/68 (BP Location: Right Arm)   Pulse 72   Temp 98.4 F (36.9 C) (Oral)   Ht 5\' 3"  (1.6 m)   Wt 192 lb 9.6 oz (87.4 kg)   BMI 34.12 kg/m   Body mass index is 34.12 kg/m.   Tobacco History  Smoking Status  . Former Smoker  . Packs/day: 0.50  . Years: 4.00  . Types: Cigarettes  Smokeless Tobacco  . Never Used    Comment: 1979     Counseling given: Not Answered   Past Medical History:  Diagnosis Date  . Arthritis    HIPS  . Asthma    HX OF  . Cancer ([place])    skin  . Orthopnea    sleeps in recliner, cannot lay flat on her back  . Stroke [facility])    CVA 2012/ MEMORY LOSS, 1999, 1 more in 2011  . Vertigo    OCCASIONAL   Past Surgical History:  Procedure Laterality Date  . CATARACT EXTRACTION Right   . CATARACT EXTRACTION W/PHACO Left 05/27/2015   Procedure: CATARACT EXTRACTION PHACO AND INTRAOCULAR LENS PLACEMENT (IOC);  Surgeon: Leandrew Koyanagi, MD;  Location: [facility];  Service: Ophthalmology;  Laterality: Left;  . KNEE ARTHROSCOPY    . MELANOMA EXCISION     from chest  . MOUTH SURGERY    . REPLACEMENT TOTAL KNEE BILATERAL    . UTERINE SUSPENSION     Family History  Problem Relation Age of Onset  . Heart attack Mother   . Heart attack Father   . Heart attack Paternal Grandmother   . Skin cancer Paternal Grandmother    History  Sexual Activity  . Sexual activity: Not on file    Outpatient Encounter Prescriptions as of 01/27/2017  Medication Sig  . acetaminophen (TYLENOL ARTHRITIS PAIN) 650 MG CR tablet Take by mouth every 8 (eight) hours as needed.   Marland Kitchen albuterol (PROAIR HFA) 108 (90 Base) MCG/ACT inhaler Inhale 2 puffs into the lungs  every 6 (six) hours as needed for wheezing or shortness of breath.  Marland Kitchen aspirin EC 81 MG tablet Take 81 mg by mouth daily. 9 am  . calcium carbonate (TUMS) 500 MG chewable tablet Chew by mouth as needed.  . diazepam (VALIUM) 5 MG tablet Take one tablet one hour prior to procedure  . FLUZONE HIGH-DOSE 0.5 ML SUSY ADM 0.5ML IM UTD  . lisinopril (PRINIVIL,ZESTRIL) 10 MG tablet TAKE 1 TABLET(5 MG) BY MOUTH DAILY  . MEGARED OMEGA-3 KRILL OIL PO Take 1 capsule by mouth daily.  . memantine (NAMENDA) 5 MG tablet Take 1 tablet by mouth 2 (two) times daily.  . Multiple Vitamin (MULTIVITAMIN) capsule Take 1 capsule by mouth daily. 9 AM  . simvastatin (ZOCOR) 20 MG tablet Take 0.5 tablets (10 mg total) by mouth every evening.  . triamterene-hydrochlorothiazide (DYAZIDE) 37.5-25 MG capsule Take 1 each (1 capsule total) by mouth daily.   No facility-administered encounter medications on file as of 01/27/2017.     Activities of Daily Living In your present state of health, do you have any difficulty performing the following activities: 01/27/2017  Hearing? N  Vision? N  Difficulty concentrating or making decisions? N  Walking or climbing stairs?  N  Dressing or bathing? N  Doing errands, shopping? N  Preparing Food and eating ? N  Using the Toilet? N  In the past six months, have you accidently leaked urine? Y  Do you have problems with loss of bowel control? N  Managing your Medications? N  Managing your Finances? N  Housekeeping or managing your Housekeeping? N  Some recent data might be hidden    Patient Care Team: Birdie Sons, MD as PCP - General (Family Medicine) Leandrew Koyanagi, MD as Referring Physician (Ophthalmology) Vladimir Crofts, MD as Consulting Physician (Neurology)    Assessment:     Exercise Activities and Dietary recommendations Current Exercise Habits: Structured exercise class, Type of exercise: Other - see comments (stationary bicycle), Time (Minutes): 60,  Frequency (Times/Week): 2, Weekly Exercise (Minutes/Week): 120, Intensity: Mild, Exercise limited by: None identified  Goals    . Increase water intake          Recommend increasing water intake to 4 glasses a day.       Fall Risk Fall Risk  01/27/2017 01/20/2016 10/20/2015  Falls in the past year? No Yes Yes  Number falls in past yr: - 1 1  Injury with Fall? - No No  Risk for fall due to : - - Impaired balance/gait;History of fall(s)  Follow up - Falls evaluation completed -   Depression Screen PHQ 2/9 Scores 01/27/2017 01/27/2017 01/20/2016 10/20/2015  PHQ - 2 Score 0 0 0 1  PHQ- 9 Score 0 - - 5     Cognitive Function     6CIT Screen 01/27/2017  What Year? 0 points  What month? 0 points  What time? 0 points  Count back from 20 0 points  Months in reverse 0 points  Repeat phrase 2 points  Total Score 2    Immunization History  Administered Date(s) Administered  . Influenza, High Dose Seasonal PF 07/13/2016  . Influenza-Unspecified 05/01/2014  . Pneumococcal Conjugate-13 07/09/2014  . Td 01/13/2005  . Zoster 01/09/2012   Screening Tests Health Maintenance  Topic Date Due  . TETANUS/TDAP  08/01/2026 (Originally 01/14/2015)  . INFLUENZA VACCINE  03/01/2017  . DEXA SCAN  Completed  . PNA vac Low Risk Adult  Completed      Plan:  I have personally reviewed and addressed the Medicare Annual Wellness questionnaire and have noted the following in the patient's chart:  A. Medical and social history B. Use of alcohol, tobacco or illicit drugs  C. Current medications and supplements D. Functional ability and status E.  Nutritional status F.  Physical activity G. Advance directives H. List of other physicians I.  Hospitalizations, surgeries, and ER visits in previous 12 months J.  [place] such as hearing and vision if needed, cognitive and depression L. Referrals and appointments - none  In addition, I have reviewed and discussed with patient certain  preventive protocols, quality metrics, and best practice recommendations. A written personalized care plan for preventive services as well as general preventive health recommendations were provided to patient.  See attached scanned questionnaire for additional information.   Signed,  Fabio Neighbors, LPN Nurse Health Advisor   MD Recommendations: None, pt declined tetanus vaccine today.

## 2017-01-27 NOTE — Patient Instructions (Signed)
Susan Campos , Thank you for taking time to come for your Medicare Wellness Visit. I appreciate your ongoing commitment to your health goals. Please review the following plan we discussed and let me know if I can assist you in the future.   Screening recommendations/referrals: Colonoscopy: N/A Mammogram: completed 03/17/16, due 03/2017 Bone Density: completed 07/23/14 Recommended yearly ophthalmology/optometry visit for glaucoma screening and checkup Recommended yearly dental visit for hygiene and checkup  Vaccinations: Influenza vaccine: due 04/2017 Pneumococcal vaccine: completed series Tdap vaccine: declined Shingles vaccine: completed 01/09/12  Advanced directives: Please bring a copy of your POA (Power of [place]) and/or Living Will to your next appointment.   Conditions/risks identified: Obesity; Recommend increasing water intake to 4 glasses a day.   Next appointment: 03/28/17 @ 11:00 AM   Preventive Care 65 Years and Older, Female Preventive care refers to lifestyle choices and visits with your health care provider that can promote health and wellness. What does preventive care include?  A yearly physical exam. This is also called an annual well check.  Dental exams once or twice a year.  Routine eye exams. Ask your health care provider how often you should have your eyes checked.  Personal lifestyle choices, including:  Daily care of your teeth and gums.  Regular physical activity.  Eating a healthy diet.  Avoiding tobacco and drug use.  Limiting alcohol use.  Practicing safe sex.  Taking low-dose aspirin every day.  Taking vitamin and mineral supplements as recommended by your health care provider. What happens during an annual well check? The services and screenings done by your health care provider during your annual well check will depend on your age, overall health, lifestyle risk factors, and family history of disease. Counseling  Your health care  provider may ask you questions about your:  Alcohol use.  Tobacco use.  Drug use.  Emotional well-being.  Home and relationship well-being.  Sexual activity.  Eating habits.  History of falls.  Memory and ability to understand (cognition).  Work and work Statistician.  Reproductive health. Screening  You may have the following tests or measurements:  Height, weight, and BMI.  Blood pressure.  Lipid and cholesterol levels. These may be checked every 5 years, or more frequently if you are over 28 years old.  Skin check.  Lung cancer screening. You may have this screening every year starting at age 61 if you have a 30-pack-year history of smoking and currently smoke or have quit within the past 15 years.  Fecal occult blood test (FOBT) of the stool. You may have this test every year starting at age 52.  Flexible sigmoidoscopy or colonoscopy. You may have a sigmoidoscopy every 5 years or a colonoscopy every 10 years starting at age 20.  Hepatitis C blood test.  Hepatitis B blood test.  Sexually transmitted disease (STD) testing.  Diabetes screening. This is done by checking your blood sugar (glucose) after you have not eaten for a while (fasting). You may have this done every 1-3 years.  Bone density scan. This is done to screen for osteoporosis. You may have this done starting at age 12.  Mammogram. This may be done every 1-2 years. Talk to your health care provider about how often you should have regular mammograms. Talk with your health care provider about your test results, treatment options, and if necessary, the need for more tests. Vaccines  Your health care provider may recommend certain vaccines, such as:  Influenza vaccine. This is recommended every year.  Tetanus, diphtheria, and acellular pertussis (Tdap, Td) vaccine. You may need a Td booster every 10 years.  Zoster vaccine. You may need this after age 69.  Pneumococcal 13-valent conjugate (PCV13)  vaccine. One dose is recommended after age 46.  Pneumococcal polysaccharide (PPSV23) vaccine. One dose is recommended after age 18. Talk to your health care provider about which screenings and vaccines you need and how often you need them. This information is not intended to replace advice given to you by your health care provider. Make sure you discuss any questions you have with your health care provider. Document Released: 08/14/2015 Document Revised: 04/06/2016 Document Reviewed: 05/19/2015 Elsevier Interactive Patient Education  2017 [place] Prevention in the Home Falls can cause injuries. They can happen to people of all ages. There are many things you can do to make your home safe and to help prevent falls. What can I do on the outside of my home?  Regularly fix the edges of walkways and driveways and fix any cracks.  Remove anything that might make you trip as you walk through a door, such as a raised step or threshold.  Trim any bushes or trees on the path to your home.  Use bright outdoor lighting.  Clear any walking paths of anything that might make someone trip, such as rocks or tools.  Regularly check to see if handrails are loose or broken. Make sure that both sides of any steps have handrails.  Any raised decks and porches should have guardrails on the edges.  Have any leaves, snow, or ice cleared regularly.  Use sand or salt on walking paths during winter.  Clean up any spills in your garage right away. This includes oil or grease spills. What can I do in the bathroom?  Use night lights.  Install grab bars by the toilet and in the tub and shower. Do not use towel bars as grab bars.  Use non-skid mats or decals in the tub or shower.  If you need to sit down in the shower, use a plastic, non-slip stool.  Keep the floor dry. Clean up any water that spills on the floor as soon as it happens.  Remove soap buildup in the tub or shower  regularly.  Attach bath mats securely with double-sided non-slip rug tape.  Do not have throw rugs and other things on the floor that can make you trip. What can I do in the bedroom?  Use night lights.  Make sure that you have a light by your bed that is easy to reach.  Do not use any sheets or blankets that are too big for your bed. They should not hang down onto the floor.  Have a firm chair that has side arms. You can use this for support while you get dressed.  Do not have throw rugs and other things on the floor that can make you trip. What can I do in the kitchen?  Clean up any spills right away.  Avoid walking on wet floors.  Keep items that you use a lot in easy-to-reach places.  If you need to reach something above you, use a strong step stool that has a grab bar.  Keep electrical cords out of the way.  Do not use floor polish or wax that makes floors slippery. If you must use wax, use non-skid floor wax.  Do not have throw rugs and other things on the floor that can make you trip. What can I do  with my stairs?  Do not leave any items on the stairs.  Make sure that there are handrails on both sides of the stairs and use them. Fix handrails that are broken or loose. Make sure that handrails are as long as the stairways.  Check any carpeting to make sure that it is firmly attached to the stairs. Fix any carpet that is loose or worn.  Avoid having throw rugs at the top or bottom of the stairs. If you do have throw rugs, attach them to the floor with carpet tape.  Make sure that you have a light switch at the top of the stairs and the bottom of the stairs. If you do not have them, ask someone to add them for you. What else can I do to help prevent falls?  Wear shoes that:  Do not have high heels.  Have rubber bottoms.  Are comfortable and fit you well.  Are closed at the toe. Do not wear sandals.  If you use a stepladder:  Make sure that it is fully  opened. Do not climb a closed stepladder.  Make sure that both sides of the stepladder are locked into place.  Ask someone to hold it for you, if possible.  Clearly mark and make sure that you can see:  Any grab bars or handrails.  First and last steps.  Where the edge of each step is.  Use tools that help you move around (mobility aids) if they are needed. These include:  Canes.  Walkers.  Scooters.  Crutches.  Turn on the lights when you go into a dark area. Replace any light bulbs as soon as they burn out.  Set up your furniture so you have a clear path. Avoid moving your furniture around.  If any of your floors are uneven, fix them.  If there are any pets around you, be aware of where they are.  Review your medicines with your doctor. Some medicines can make you feel dizzy. This can increase your chance of falling. Ask your doctor what other things that you can do to help prevent falls. This information is not intended to replace advice given to you by your health care provider. Make sure you discuss any questions you have with your health care provider. Document Released: 05/14/2009 Document Revised: 12/24/2015 Document Reviewed: 08/22/2014 Elsevier Interactive Patient Education  2017 [employer].

## 2017-01-30 ENCOUNTER — Other Ambulatory Visit: Payer: Self-pay | Admitting: Family Medicine

## 2017-01-30 DIAGNOSIS — E7800 Pure hypercholesterolemia, unspecified: Secondary | ICD-10-CM

## 2017-01-30 MED ORDER — SIMVASTATIN 20 MG PO TABS: 10.0000 mg | ORAL_TABLET | Freq: Every evening | ORAL | 5 refills | Status: DC

## 2017-01-30 NOTE — Telephone Encounter (Signed)
[facility] faxed a request on the following medication. Thanks CC  simvastatin (ZOCOR) 20 MG tablet  >Take 1 tablet by mouth every evening.

## 2017-03-28 ENCOUNTER — Encounter: Payer: Self-pay | Admitting: Family Medicine

## 2017-03-28 ENCOUNTER — Ambulatory Visit (INDEPENDENT_AMBULATORY_CARE_PROVIDER_SITE_OTHER): Payer: Medicare Other | Admitting: Family Medicine

## 2017-03-28 VITALS — BP 138/66 | HR 72 | Temp 98.8°F | Resp 16 | Wt 192.0 lb

## 2017-03-28 DIAGNOSIS — I10 Essential (primary) hypertension: Secondary | ICD-10-CM | POA: Diagnosis not present

## 2017-03-28 NOTE — Progress Notes (Signed)
Patient: Susan Campos Female    DOB: [birth year]   83 y.o.   MRN: [id number] Visit Date: 03/28/2017  Today's Provider: Lelon Huh, MD   Chief Complaint  Patient presents with  . Hypertension    follow up  . Chronic Kidney Disease    follow up  . Hyperlipidemia    follow up   Subjective:    HPI  Hypertension, follow-up:  BP Readings from Last 3 Encounters:  01/27/17 132/68  12/23/16 (!) 172/92  07/22/16 120/70    She was last seen for hypertension 3 months ago.  BP at that visit was 172/92. Management since that visit includes no changes. She reports good compliance with treatment. She is not having side effects.  She is exercising. She is adherent to low salt diet.   Outside blood pressures are 140/70. She is experiencing none.  Patient denies chest pain, chest pressure/discomfort, claudication, dyspnea, exertional chest pressure/discomfort, fatigue, irregular heart beat, lower extremity edema, near-syncope, orthopnea, palpitations, paroxysmal nocturnal dyspnea, syncope and tachypnea.   Cardiovascular risk factors include advanced age (older than 41 for men, 39 for women) and hypertension.  Use of agents associated with hypertension: NSAIDS.     Weight trend: stable Wt Readings from Last 3 Encounters:  01/27/17 192 lb 9.6 oz (87.4 kg)  12/23/16 191 lb (86.6 kg)  07/22/16 198 lb (89.8 kg)    Current diet: well balanced  ------------------------------------------------------------------------ Follow up of CKD:  Patient was last seen for this problem 3 months ago and no changes were made.    Lipid/Cholesterol, Follow-up:   Last seen for this 3 months ago.  Management changes since that visit include none. . Last Lipid Panel:    Component Value Date/Time   CHOL 214 (H) 12/23/2016 1150   TRIG 203 (H) 12/23/2016 1150   HDL 38 (L) 12/23/2016 1150   CHOLHDL 5.6 (H) 12/23/2016 1150   LDLCALC 135 (H) 12/23/2016 1150    Risk factors for  vascular disease include hypercholesterolemia and hypertension  She reports good compliance with treatment. She is not having side effects.  Current symptoms include none and have been stable. Weight trend: stable Prior visit with dietician: no Current diet: well balanced Current exercise: group exercise at the hospital  Wt Readings from Last 3 Encounters:  01/27/17 192 lb 9.6 oz (87.4 kg)  12/23/16 191 lb (86.6 kg)  07/22/16 198 lb (89.8 kg)    -------------------------------------------------------------------     Allergies  Allergen Reactions  . Lovastatin Shortness Of Breath and Palpitations  . Aricept [Donepezil Hcl]     dizziness     Current Outpatient Prescriptions:  .  acetaminophen (TYLENOL ARTHRITIS PAIN) 650 MG CR tablet, Take by mouth every 8 (eight) hours as needed. , Disp: , Rfl:  .  albuterol (PROAIR HFA) 108 (90 Base) MCG/ACT inhaler, Inhale 2 puffs into the lungs every 6 (six) hours as needed for wheezing or shortness of breath., Disp: 18 g, Rfl: 1 .  aspirin EC 81 MG tablet, Take 81 mg by mouth daily. 9 am, Disp: , Rfl:  .  calcium carbonate (TUMS) 500 MG chewable tablet, Chew by mouth as needed., Disp: , Rfl:  .  lisinopril (PRINIVIL,ZESTRIL) 10 MG tablet, TAKE 1 TABLET(5 MG) BY MOUTH DAILY, Disp: 30 tablet, Rfl: 3 .  MEGARED OMEGA-3 KRILL OIL PO, Take 1 capsule by mouth daily., Disp: , Rfl:  .  memantine (NAMENDA) 5 MG tablet, Take 1 tablet by mouth 2 (two) times  daily., Disp: , Rfl:  .  Multiple Vitamin (MULTIVITAMIN) capsule, Take 1 capsule by mouth daily. 9 AM, Disp: , Rfl:  .  simvastatin (ZOCOR) 20 MG tablet, Take 0.5 tablets (10 mg total) by mouth every evening., Disp: 30 tablet, Rfl: 5 .  triamterene-hydrochlorothiazide (DYAZIDE) 37.5-25 MG capsule, Take 1 each (1 capsule total) by mouth daily., Disp: 30 capsule, Rfl: 5  Review of Systems  Constitutional: Negative for appetite change, chills, fatigue and fever.  Respiratory: Negative for chest  tightness and shortness of breath.   Cardiovascular: Negative for chest pain and palpitations.  Gastrointestinal: Negative for abdominal pain, nausea and vomiting.  Neurological: Negative for dizziness and weakness.    Social History  Substance Use Topics  . Smoking status: Former Smoker    Packs/day: 0.50    Years: 4.00    Types: Cigarettes  . Smokeless tobacco: Never Used     Comment: 1979  . Alcohol use No   Objective:   BP 138/66 (BP Location: Right Arm, Cuff Size: Large)   Pulse 72   Temp 98.8 F (37.1 C) (Oral)   Resp 16   Wt 192 lb (87.1 kg)   SpO2 96% Comment: room air  BMI 34.01 kg/m  Vitals:   03/28/17 1109 03/28/17 1112  BP: (!) 150/64 138/66  Pulse: 72   Resp: 16   Temp: 98.8 F (37.1 C)   TempSrc: Oral   SpO2: 96%   Weight: 192 lb (87.1 kg)      Physical Exam   General Appearance:    Alert, cooperative, no distress  Eyes:    PERRL, conjunctiva/corneas clear, EOM's intact       Lungs:     Clear to auscultation bilaterally, respirations unlabored  Heart:    Regular rate and rhythm  Neurologic:   Awake, alert, oriented x 3. No apparent focal neurological           defect.           Assessment & Plan:     1. Benign hypertension Well controlled.  Continue current medications.   She decline flu vaccine but plans to call back to schedule at a later time in the fall.  Return in about 6 months (around 09/28/2017).        Lelon Huh, MD  [facility]

## 2017-03-28 NOTE — Patient Instructions (Signed)
.   I recommend that you get a flu vaccine this year. Please call our office at [phone] at your earliest convenience to schedule a flu shot.    

## 2017-05-04 ENCOUNTER — Ambulatory Visit (INDEPENDENT_AMBULATORY_CARE_PROVIDER_SITE_OTHER): Payer: Medicare Other

## 2017-05-04 DIAGNOSIS — Z23 Encounter for immunization: Secondary | ICD-10-CM

## 2017-06-16 ENCOUNTER — Other Ambulatory Visit: Payer: Self-pay | Admitting: Family Medicine

## 2017-06-16 NOTE — Telephone Encounter (Signed)
[facility] faxed a refill request on the following medications:  lisinopril (PRINIVIL,ZESTRIL) 10 MG tablet   [facility]

## 2017-06-17 MED ORDER — LISINOPRIL 10 MG PO TABS: ORAL_TABLET | ORAL | 11 refills | Status: DC

## 2017-07-12 ENCOUNTER — Other Ambulatory Visit: Payer: Self-pay | Admitting: Family Medicine

## 2017-07-12 DIAGNOSIS — E7800 Pure hypercholesterolemia, unspecified: Secondary | ICD-10-CM

## 2017-07-12 MED ORDER — SIMVASTATIN 10 MG PO TABS: 10.0000 mg | ORAL_TABLET | Freq: Every day | ORAL | 3 refills | Status: DC

## 2017-07-12 NOTE — Progress Notes (Signed)
Change from 1/2 20mg  tablets to 1 10 mg tablet.

## 2017-08-31 ENCOUNTER — Ambulatory Visit (INDEPENDENT_AMBULATORY_CARE_PROVIDER_SITE_OTHER): Payer: Medicare Other | Admitting: Physician Assistant

## 2017-08-31 ENCOUNTER — Encounter: Payer: Self-pay | Admitting: Physician Assistant

## 2017-08-31 VITALS — BP 140/86 | HR 80 | Temp 98.3°F | Resp 16 | Wt 183.0 lb

## 2017-08-31 DIAGNOSIS — J069 Acute upper respiratory infection, unspecified: Secondary | ICD-10-CM | POA: Diagnosis not present

## 2017-08-31 MED ORDER — AMOXICILLIN-POT CLAVULANATE 875-125 MG PO TABS: 1.0000 | ORAL_TABLET | Freq: Two times a day (BID) | ORAL | 0 refills | Status: DC

## 2017-08-31 NOTE — Progress Notes (Signed)
Patient: Susan Campos Female    DOB: Dec 01, 1932   84 y.o.   MRN: [id number] Visit Date: 08/31/2017  Today's Provider: Mar Daring, PA-C   Chief Complaint  Patient presents with  . URI   Subjective:    HPI Upper Respiratory Infection: Patient complains of symptoms of a URI, possible sinusitis. Symptoms include left ear pain, cough and sore throat. Onset of symptoms was 4 days ago, gradually worsening since that time. She also c/o headache described as pressure, non productive cough and shortness of breath for the past 2 days .  She is drinking plenty of fluids. Evaluation to date: none. Treatment to date: none.     Allergies  Allergen Reactions  . Lovastatin Shortness Of Breath and Palpitations  . Aricept [Donepezil Hcl]     dizziness     Current Outpatient Medications:  .  acetaminophen (TYLENOL ARTHRITIS PAIN) 650 MG CR tablet, Take by mouth every 8 (eight) hours as needed. , Disp: , Rfl:  .  albuterol (PROAIR HFA) 108 (90 Base) MCG/ACT inhaler, Inhale 2 puffs into the lungs every 6 (six) hours as needed for wheezing or shortness of breath., Disp: 18 g, Rfl: 1 .  aspirin EC 81 MG tablet, Take 81 mg by mouth daily. 9 am, Disp: , Rfl:  .  calcium carbonate (TUMS) 500 MG chewable tablet, Chew by mouth as needed., Disp: , Rfl:  .  lisinopril (PRINIVIL,ZESTRIL) 10 MG tablet, TAKE 1 TABLET(5 MG) BY MOUTH DAILY, Disp: 30 tablet, Rfl: 11 .  Multiple Vitamin (MULTIVITAMIN) capsule, Take 1 capsule by mouth daily. 9 AM, Disp: , Rfl:  .  simvastatin (ZOCOR) 10 MG tablet, Take 1 tablet (10 mg total) by mouth daily., Disp: 90 tablet, Rfl: 3 .  triamterene-hydrochlorothiazide (DYAZIDE) 37.5-25 MG capsule, Take 1 each (1 capsule total) by mouth daily., Disp: 30 capsule, Rfl: 5  Review of Systems  Constitutional: Positive for activity change and chills.  HENT: Positive for congestion, ear pain, sore throat and voice change.   Respiratory: Positive for cough, shortness  of breath and wheezing.   Cardiovascular: Negative.   Gastrointestinal: Negative.   Neurological: Positive for headaches. Negative for dizziness.    Social History   Tobacco Use  . Smoking status: Former Smoker    Packs/day: 0.50    Years: 4.00    Pack years: 2.00    Types: Cigarettes  . Smokeless tobacco: Never Used  . Tobacco comment: 1979  Substance Use Topics  . Alcohol use: No   Objective:   BP 140/86 (BP Location: Left Arm, Patient Position: Sitting, Cuff Size: Large)   Pulse 80   Temp 98.3 F (36.8 C)   Resp 16   Wt 183 lb (83 kg)   SpO2 96%   BMI 32.42 kg/m  Vitals:   08/31/17 1507  BP: 140/86  Pulse: 80  Resp: 16  Temp: 98.3 F (36.8 C)  SpO2: 96%  Weight: 183 lb (83 kg)     Physical Exam  Constitutional: She appears well-developed and well-nourished. No distress.  HENT:  Head: Normocephalic and atraumatic.  Right Ear: Hearing, tympanic membrane, external ear and ear canal normal.  Left Ear: Hearing, tympanic membrane, external ear and ear canal normal.  Nose: Nose normal.  Mouth/Throat: Uvula is midline, oropharynx is clear and moist and mucous membranes are normal. No oropharyngeal exudate.  Eyes: Conjunctivae are normal. Pupils are equal, round, and reactive to light. Right eye exhibits no discharge. Left  eye exhibits no discharge. No scleral icterus.  Neck: Normal range of motion. Neck supple. No tracheal deviation present. No thyromegaly present.  Cardiovascular: Normal rate, regular rhythm and normal heart sounds. Exam reveals no gallop and no friction rub.  No murmur heard. Pulmonary/Chest: Effort normal and breath sounds normal. No stridor. No respiratory distress. She has no wheezes. She has no rales.  Lymphadenopathy:    She has no cervical adenopathy.  Skin: Skin is warm and dry. She is not diaphoretic.  Vitals reviewed.       Assessment & Plan:     1. Upper respiratory tract infection, unspecified type Worsening symptoms that have  not responded to OTC medications. Will give augmentin as below. Continue allergy medications. Stay well hydrated and get plenty of rest. Call if no symptom improvement or if symptoms worsen. - amoxicillin-clavulanate (AUGMENTIN) 875-125 MG tablet; Take 1 tablet by mouth 2 (two) times daily.  Dispense: 20 tablet; Refill: 0       Mar Daring, PA-C  [facility]

## 2017-09-28 ENCOUNTER — Encounter: Payer: Self-pay | Admitting: Family Medicine

## 2017-09-28 ENCOUNTER — Ambulatory Visit (INDEPENDENT_AMBULATORY_CARE_PROVIDER_SITE_OTHER): Payer: Medicare Other | Admitting: Family Medicine

## 2017-09-28 VITALS — BP 132/82 | HR 67 | Temp 97.7°F | Resp 18 | Wt 185.0 lb

## 2017-09-28 DIAGNOSIS — I679 Cerebrovascular disease, unspecified: Secondary | ICD-10-CM | POA: Diagnosis not present

## 2017-09-28 DIAGNOSIS — E7800 Pure hypercholesterolemia, unspecified: Secondary | ICD-10-CM | POA: Diagnosis not present

## 2017-09-28 DIAGNOSIS — I10 Essential (primary) hypertension: Secondary | ICD-10-CM

## 2017-09-28 MED ORDER — SIMVASTATIN 10 MG PO TABS: 10.0000 mg | ORAL_TABLET | Freq: Every day | ORAL | 3 refills | Status: DC

## 2017-09-28 NOTE — Progress Notes (Signed)
Patient: Susan Campos Female    DOB: April 09, 1933   84 y.o.   MRN: [id number] Visit Date: 09/28/2017  Today's Provider: Lelon Huh, MD   Chief Complaint  Patient presents with  . Follow-up  . Hypertension  . Chronic Kidney Disease  . Hyperlipidemia   Subjective:    HPI   Hypertension, follow-up:  BP Readings from Last 3 Encounters:  08/31/17 140/86  03/28/17 138/66  01/27/17 132/68    She was last seen for hypertension 6 months ago.  BP at that visit was 138/66. Management since that visit includes; no changes.She reports fair compliance with treatment. She is having side effects. Polyuria. Patient stopped taking Triameterne/ HCTZ due to it causing her to have urinary accidents. Patient has doubled up on the Lisinopril. She is exercising. She is adherent to low salt diet.   Outside blood pressures are not being checked. She is experiencing none.  Patient denies chest pain, chest pressure/discomfort, claudication, dyspnea, exertional chest pressure/discomfort, fatigue, irregular heart beat, lower extremity edema, near-syncope, orthopnea, palpitations, paroxysmal nocturnal dyspnea, syncope and tachypnea.   Cardiovascular risk factors include advanced age (older than 40 for men, 50 for women) and hypertension.  Use of agents associated with hypertension: NSAIDS.   ------------------------------------------------------------------------    Lipid/Cholesterol, Follow-up:   Last seen for this 9 months ago.  Management since that visit includes; no changes.  Last Lipid Panel:    Component Value Date/Time   CHOL 214 (H) 12/23/2016 1150   TRIG 203 (H) 12/23/2016 1150   HDL 38 (L) 12/23/2016 1150   CHOLHDL 5.6 (H) 12/23/2016 1150   LDLCALC 135 (H) 12/23/2016 1150    She reports good compliance with treatment. She is not having side effects.   Wt Readings from Last 3 Encounters:  08/31/17 183 lb (83 kg)  03/28/17 192 lb (87.1 kg)  01/27/17 192 lb  9.6 oz (87.4 kg)    ------------------------------------------------------------------------    Allergies  Allergen Reactions  . Lovastatin Shortness Of Breath and Palpitations  . Aricept [Donepezil Hcl]     dizziness     Current Outpatient Medications:  .  acetaminophen (TYLENOL ARTHRITIS PAIN) 650 MG CR tablet, Take by mouth every 8 (eight) hours as needed. , Disp: , Rfl:  .  albuterol (PROAIR HFA) 108 (90 Base) MCG/ACT inhaler, Inhale 2 puffs into the lungs every 6 (six) hours as needed for wheezing or shortness of breath., Disp: 18 g, Rfl: 1 .  aspirin EC 81 MG tablet, Take 81 mg by mouth daily. 9 am, Disp: , Rfl:  .  calcium carbonate (TUMS) 500 MG chewable tablet, Chew by mouth as needed., Disp: , Rfl:  .  lisinopril (PRINIVIL,ZESTRIL) 10 MG tablet, TAKE 1 TABLET(5 MG) BY MOUTH DAILY (Patient taking differently: Take 20 mg by mouth daily. ), Disp: 30 tablet, Rfl: 11 .  Multiple Vitamin (MULTIVITAMIN) capsule, Take 1 capsule by mouth daily. 9 AM, Disp: , Rfl:  .  simvastatin (ZOCOR) 20 MG tablet, Take 1/2 tablet (10 mg total) by mouth daily., Disp: 90 tablet, Rfl: 3 .  triamterene-hydrochlorothiazide (DYAZIDE) 37.5-25 MG capsule, Take 1 each (1 capsule total) by mouth daily. (Patient not taking: Reported on 09/28/2017), Disp: 30 capsule, Rfl: 5  Review of Systems  Constitutional: Negative for appetite change, chills, fatigue and fever.  Respiratory: Negative for chest tightness and shortness of breath.   Cardiovascular: Negative for chest pain and palpitations.  Gastrointestinal: Negative for abdominal pain, nausea and vomiting.  Neurological:  Positive for headaches. Negative for dizziness and weakness.    Social History   Tobacco Use  . Smoking status: Former Smoker    Packs/day: 0.50    Years: 4.00    Pack years: 2.00    Types: Cigarettes  . Smokeless tobacco: Never Used  . Tobacco comment: 1979  Substance Use Topics  . Alcohol use: No   Objective:   BP 132/82 (BP  Location: Right Arm, Patient Position: Sitting, Cuff Size: Large)   Pulse 67   Temp 97.7 F (36.5 C) (Oral)   Resp 18   Wt 185 lb (83.9 kg)   SpO2 95% Comment: room air  BMI 32.77 kg/m     Physical Exam   General Appearance:    Alert, cooperative, no distress  Eyes:    PERRL, conjunctiva/corneas clear, EOM's intact       Lungs:     Clear to auscultation bilaterally, respirations unlabored  Heart:    Regular rate and rhythm  Neurologic:   Awake, alert, oriented x 3. No apparent focal neurological           defect.           Assessment & Plan:     1. Hypercholesteremia Doing well with 1/2 x 20mg  simvastatin. Sent in new prescription to take one full 10mg  tablet daily.  - simvastatin (ZOCOR) 10 MG tablet; Take 1 tablet (10 mg total) by mouth daily.  Dispense: 90 tablet; Refill: 3  2. Benign hypertension Well controlled off of triamterene-hctz and on 2 lisinopril daily. Will check labs at follow up.   3. Cerebrovascular disease She admits to slow thinking process but memory is good. Recommend she not drive due to slow response times.        Lelon Huh, MD  [facility]

## 2017-09-28 NOTE — Patient Instructions (Signed)
   Please call a week before you run out of lisinopril so we call in higher dosage for you

## 2017-10-17 ENCOUNTER — Ambulatory Visit (INDEPENDENT_AMBULATORY_CARE_PROVIDER_SITE_OTHER): Payer: Medicare Other | Admitting: Family Medicine

## 2017-10-17 ENCOUNTER — Encounter: Payer: Self-pay | Admitting: Family Medicine

## 2017-10-17 VITALS — BP 130/74 | HR 75 | Temp 98.5°F | Resp 16

## 2017-10-17 DIAGNOSIS — H6692 Otitis media, unspecified, left ear: Secondary | ICD-10-CM

## 2017-10-17 DIAGNOSIS — R42 Dizziness and giddiness: Secondary | ICD-10-CM | POA: Diagnosis not present

## 2017-10-17 MED ORDER — MECLIZINE HCL 12.5 MG PO TABS: 12.5000 mg | ORAL_TABLET | Freq: Three times a day (TID) | ORAL | 0 refills | Status: DC | PRN

## 2017-10-17 MED ORDER — AMOXICILLIN 500 MG PO CAPS: 1000.0000 mg | ORAL_CAPSULE | Freq: Two times a day (BID) | ORAL | 0 refills | Status: AC

## 2017-10-17 NOTE — Addendum Note (Signed)
Addended by: Birdie Sons on: 10/17/2017 05:32 PM   Modules accepted: Orders

## 2017-10-17 NOTE — Progress Notes (Addendum)
Patient: Susan Campos Female    DOB: 1933-04-12   84 y.o.   MRN: [id number] Visit Date: 10/17/2017  Today's Provider: Lelon Huh, MD   Chief Complaint  Patient presents with  . Dizziness   Subjective:    HPI Patient has had dizziness and been off balance for 4 days.Both ears feel congested but left is worse, and her throat has been slightly sore. Patient states she fell four days ago due to dizziness, but not injure herself.     Allergies  Allergen Reactions  . Lovastatin Shortness Of Breath and Palpitations  . Aricept [Donepezil Hcl]     dizziness     Current Outpatient Medications:  .  acetaminophen (TYLENOL ARTHRITIS PAIN) 650 MG CR tablet, Take by mouth every 8 (eight) hours as needed. , Disp: , Rfl:  .  albuterol (PROAIR HFA) 108 (90 Base) MCG/ACT inhaler, Inhale 2 puffs into the lungs every 6 (six) hours as needed for wheezing or shortness of breath., Disp: 18 g, Rfl: 1 .  aspirin EC 81 MG tablet, Take 81 mg by mouth daily. 9 am, Disp: , Rfl:  .  calcium carbonate (TUMS) 500 MG chewable tablet, Chew by mouth as needed., Disp: , Rfl:  .  lisinopril (PRINIVIL,ZESTRIL) 10 MG tablet, TAKE 1 TABLETBY MOUTH DAILY (Patient taking differently: Take 20 mg by mouth daily. ), Disp: 30 tablet, Rfl: 11 .  simvastatin (ZOCOR) 10 MG tablet, Take 1 tablet (10 mg total) by mouth daily., Disp: 90 tablet, Rfl: 3 .  amoxicillin (AMOXIL) 500 MG capsule, Take 2 capsules (1,000 mg total) by mouth 2 (two) times daily for 7 days., Disp: 28 capsule, Rfl: 0 .  meclizine (ANTIVERT) 12.5 MG tablet, Take 1 tablet (12.5 mg total) by mouth 3 (three) times daily as needed for dizziness., Disp: 30 tablet, Rfl: 0 .  Multiple Vitamin (MULTIVITAMIN) capsule, Take 1 capsule by mouth daily. 9 AM, Disp: , Rfl:   Review of Systems  Constitutional: Negative for appetite change, chills, fatigue and fever.  HENT: Positive for congestion.   Respiratory: Negative for chest tightness and shortness  of breath.   Cardiovascular: Negative for chest pain and palpitations.  Gastrointestinal: Negative for abdominal pain, nausea and vomiting.  Neurological: Negative for dizziness and weakness.    Social History   Tobacco Use  . Smoking status: Former Smoker    Packs/day: 0.50    Years: 4.00    Pack years: 2.00    Types: Cigarettes  . Smokeless tobacco: Never Used  . Tobacco comment: 1979  Substance Use Topics  . Alcohol use: No   Objective:   BP 130/74 (BP Location: Right Arm, Patient Position: Sitting, Cuff Size: Large)   Pulse 75   Temp 98.5 F (36.9 C) (Oral)   Resp 16   SpO2 99%  Vitals:   10/17/17 1602  BP: 130/74  Pulse: 75  Resp: 16  Temp: 98.5 F (36.9 C)  TempSrc: Oral  SpO2: 99%     Physical Exam  General Appearance:    Alert, cooperative, no distress  HENT:   right TM normal without fluid or infection, left TM red, dull, bulging, neck without nodes, pharynx erythematous without exudate, sinuses nontender and nasal mucosa congested  Eyes:    PERRL, conjunctiva/corneas clear, EOM's intact       Lungs:     Clear to auscultation bilaterally, respirations unlabored  Heart:    Regular rate and rhythm  Neurologic:   Awake,  alert, oriented x 3. No apparent focal neurological           defect.           Assessment & Plan:     1. Otitis of left ear  - amoxicillin (AMOXIL) 500 MG capsule; Take 2 capsules (1,000 mg total) by mouth 2 (two) times daily for 7 days.  Dispense: 28 capsule; Refill: 0  2. Vertigo  - meclizine (ANTIVERT) 12.5 MG tablet; Take 1 tablet (12.5 mg total) by mouth 3 (three) times daily as needed for dizziness.  Dispense: 30 tablet; Refill: 0  Call if symptoms change or if not rapidly improving.          Lelon Huh, MD  [facility]

## 2017-10-23 ENCOUNTER — Telehealth: Payer: Self-pay | Admitting: Family Medicine

## 2017-10-23 NOTE — Telephone Encounter (Signed)
Pharmacy is calling regarding Meclizine 12.5 MG, they are needing clinical questions.  They are faxing over a form with the clinical questions. States if they do not receive information back by 1108 AM (centeral time)  The process will have to start back over.

## 2017-12-19 ENCOUNTER — Other Ambulatory Visit: Payer: Self-pay | Admitting: Family Medicine

## 2017-12-19 DIAGNOSIS — R42 Dizziness and giddiness: Secondary | ICD-10-CM

## 2017-12-20 ENCOUNTER — Other Ambulatory Visit: Payer: Self-pay | Admitting: Family Medicine

## 2017-12-20 MED ORDER — LISINOPRIL 10 MG PO TABS: 10.0000 mg | ORAL_TABLET | Freq: Every day | ORAL | 11 refills | Status: DC

## 2017-12-20 NOTE — Telephone Encounter (Signed)
[facility] faxed a refill request for the following medication. Thanks CC  lisinopril (PRINIVIL,ZESTRIL) 10 MG tablet

## 2018-01-09 ENCOUNTER — Telehealth: Payer: Self-pay | Admitting: Family Medicine

## 2018-01-09 NOTE — Telephone Encounter (Signed)
Return call to pharmacy. Renee from [facility] stated patient told her she is taking lisinopril 4 x daily. For clarification, patient is only suppose to be taking lisinopril 10 mg once qd? Please advise?

## 2018-01-09 NOTE — Telephone Encounter (Signed)
Per Dr. Caryn Section advised pt to schedule an appt. Scheduled ov for 01/10/18 at 11 am.

## 2018-01-09 NOTE — Telephone Encounter (Signed)
Her lisinopril is once a day.

## 2018-01-09 NOTE — Telephone Encounter (Signed)
[facility] called needing clarification on patients  Lisinopril 10 mg  Pt state she is suppose to take four tablets a day and they say she is suppose to take one a day.  Please call Renee at [phone] this is the [facility] health outcome pharmacist who calls patients and checks on them regarding their prescritptions and when she told them she was taking 4 pills a day they were concerned,    Please advise   Con Memos

## 2018-01-09 NOTE — Telephone Encounter (Signed)
Called patient and advised her she isn only suppose to be taking 1 lisinopril daily. While I was speaking to the patient on the phone, she stated she thinks she may have had another stroke about a month ago. Patient states her speech is slower and her left leg is not working right. Patient wanted to know what Dr. Caryn Section thinks she should do? Please advise?

## 2018-01-10 ENCOUNTER — Encounter: Payer: Self-pay | Admitting: Family Medicine

## 2018-01-10 ENCOUNTER — Ambulatory Visit (INDEPENDENT_AMBULATORY_CARE_PROVIDER_SITE_OTHER): Payer: Medicare Other | Admitting: Family Medicine

## 2018-01-10 VITALS — BP 134/84 | HR 80 | Temp 98.3°F | Resp 16 | Wt 183.0 lb

## 2018-01-10 DIAGNOSIS — I6930 Unspecified sequelae of cerebral infarction: Secondary | ICD-10-CM

## 2018-01-10 DIAGNOSIS — N183 Chronic kidney disease, stage 3 unspecified: Secondary | ICD-10-CM

## 2018-01-10 DIAGNOSIS — I10 Essential (primary) hypertension: Secondary | ICD-10-CM | POA: Diagnosis not present

## 2018-01-10 DIAGNOSIS — R4182 Altered mental status, unspecified: Secondary | ICD-10-CM | POA: Diagnosis not present

## 2018-01-10 DIAGNOSIS — E7800 Pure hypercholesterolemia, unspecified: Secondary | ICD-10-CM | POA: Diagnosis not present

## 2018-01-10 DIAGNOSIS — N3001 Acute cystitis with hematuria: Secondary | ICD-10-CM

## 2018-01-10 DIAGNOSIS — E785 Hyperlipidemia, unspecified: Secondary | ICD-10-CM

## 2018-01-10 DIAGNOSIS — G309 Alzheimer's disease, unspecified: Secondary | ICD-10-CM

## 2018-01-10 DIAGNOSIS — R358 Other polyuria: Secondary | ICD-10-CM

## 2018-01-10 DIAGNOSIS — F0150 Vascular dementia without behavioral disturbance: Secondary | ICD-10-CM

## 2018-01-10 DIAGNOSIS — Z8673 Personal history of transient ischemic attack (TIA), and cerebral infarction without residual deficits: Secondary | ICD-10-CM | POA: Diagnosis not present

## 2018-01-10 DIAGNOSIS — M8580 Other specified disorders of bone density and structure, unspecified site: Secondary | ICD-10-CM

## 2018-01-10 DIAGNOSIS — F0280 Dementia in other diseases classified elsewhere without behavioral disturbance: Secondary | ICD-10-CM

## 2018-01-10 DIAGNOSIS — R270 Ataxia, unspecified: Secondary | ICD-10-CM | POA: Diagnosis not present

## 2018-01-10 DIAGNOSIS — I6990 Unspecified sequelae of unspecified cerebrovascular disease: Secondary | ICD-10-CM

## 2018-01-10 DIAGNOSIS — R3589 Other polyuria: Secondary | ICD-10-CM

## 2018-01-10 LAB — POCT URINALYSIS DIPSTICK
Appearance: ABNORMAL
Bilirubin, UA: NEGATIVE
Glucose, UA: NEGATIVE
KETONES UA: NEGATIVE
NITRITE UA: POSITIVE
PROTEIN UA: POSITIVE — AB
SPEC GRAV UA: 1.015 (ref 1.010–1.025)
Urobilinogen, UA: 0.2 E.U./dL
pH, UA: 6.5 (ref 5.0–8.0)

## 2018-01-10 MED ORDER — CIPROFLOXACIN HCL 500 MG PO TABS: 500.0000 mg | ORAL_TABLET | Freq: Two times a day (BID) | ORAL | 0 refills | Status: AC

## 2018-01-10 NOTE — Addendum Note (Signed)
Addended by: Julieta Bellini on: 01/10/2018 01:48 PM   Modules accepted: Orders

## 2018-01-10 NOTE — Progress Notes (Signed)
Patient: Susan Campos Female    DOB: 08-28-1932   84 y.o.   MRN: [id number] Visit Date: 01/10/2018  Today's Provider: Lelon Huh, MD   No chief complaint on file.  Subjective:    HPI  Patient stated she thinks she may have had another stroke about a month ago. Patient states her speech is slower and her left leg is not working right. Patient also states she has been having issues with her memory.   She does have history of lacunar and pontine infarcts per MRI. She was seen by neurology on 10/10/2017 with some of the same complaints, but she and her husband feel her memory and thinking have gotten much slower the last several weeks.   She also reports that she has been urinating more frequently with urgency and sometimes loss of bladder control. No burning or stinging with urination.     Allergies  Allergen Reactions  . Lovastatin Shortness Of Breath and Palpitations  . Aricept [Donepezil Hcl]     dizziness     Current Outpatient Medications:  .  acetaminophen (TYLENOL ARTHRITIS PAIN) 650 MG CR tablet, Take by mouth every 8 (eight) hours as needed. , Disp: , Rfl:  .  albuterol (PROAIR HFA) 108 (90 Base) MCG/ACT inhaler, Inhale 2 puffs into the lungs every 6 (six) hours as needed for wheezing or shortness of breath., Disp: 18 g, Rfl: 1 .  aspirin EC 81 MG tablet, Take 81 mg by mouth daily. 9 am, Disp: , Rfl:  .  calcium carbonate (TUMS) 500 MG chewable tablet, Chew by mouth as needed., Disp: , Rfl:  .  lisinopril (PRINIVIL,ZESTRIL) 10 MG tablet, Take 1 tablet (10 mg total) by mouth daily., Disp: 30 tablet, Rfl: 11 .  meclizine (ANTIVERT) 12.5 MG tablet, TAKE 1 TABLET(12.5 MG) BY MOUTH THREE TIMES DAILY AS NEEDED FOR DIZZINESS, Disp: 30 tablet, Rfl: 3 .  Multiple Vitamin (MULTIVITAMIN) capsule, Take 1 capsule by mouth daily. 9 AM, Disp: , Rfl:  .  simvastatin (ZOCOR) 10 MG tablet, Take 1 tablet (10 mg total) by mouth daily., Disp: 90 tablet, Rfl: 3  Review of  Systems  Constitutional: Negative for appetite change, chills, fatigue and fever.  Respiratory: Negative for chest tightness and shortness of breath.   Cardiovascular: Negative for chest pain and palpitations.  Gastrointestinal: Negative for abdominal pain, nausea and vomiting.  Neurological: Negative for dizziness and weakness.    Social History   Tobacco Use  . Smoking status: Former Smoker    Packs/day: 0.50    Years: 4.00    Pack years: 2.00    Types: Cigarettes  . Smokeless tobacco: Never Used  . Tobacco comment: 1979  Substance Use Topics  . Alcohol use: No   Objective:   BP 134/84 (BP Location: Right Arm, Patient Position: Sitting, Cuff Size: Large)   Pulse 80   Temp 98.3 F (36.8 C) (Oral)   Resp 16   Wt 183 lb (83 kg)   SpO2 97%   BMI 32.42 kg/m     Physical Exam   General Appearance:    Alert, cooperative, no distress  Eyes:    PERRL, conjunctiva/corneas clear, EOM's intact       Lungs:     Clear to auscultation bilaterally, respirations unlabored  Heart:    Regular rate and rhythm  Neurologic:   Awake, alert, oriented x 3. Slow to rise from chair, requires assistance. Measured gait. No focal deficits.  Results for orders placed or performed in visit on 01/10/18  POCT urinalysis dipstick  Result Value Ref Range   Color, UA Yellow    Clarity, UA Cloudy    Glucose, UA Negative Negative   Bilirubin, UA Neg    Ketones, UA Neg    Spec Grav, UA 1.015 1.010 - 1.025   Blood, UA Moderate Non-Hemolyzed    pH, UA 6.5 5.0 - 8.0   Protein, UA Positive (A) Negative   Urobilinogen, UA 0.2 0.2 or 1.0 E.U./dL   Nitrite, UA Positive    Leukocytes, UA Large (3+) (A) Negative   Appearance Abnormal    Odor Abormal        Assessment & Plan:     1. Altered mental status, unspecified altered mental status type No sign of any new focal neuro deficits. Sx likely secondary to UTI as below.  - CBC - Comprehensive metabolic panel - Lipid panel - TSH - Vitamin  B12  2. Polyuria  - POCT urinalysis dipstick  3. Mixed Alzheimer's and vascular dementia Continue routine follow up neurology.   4. Benign hypertension Well controlled.  Continue current medications.    5. Osteopenia, unspecified location  - VITAMIN D 25 Hydroxy (Vit-D Deficiency, Fractures)  6. Hypercholesteremia Taking low dose simvastatin consistently.  - CBC - Comprehensive metabolic panel - Lipid panel - TSH  7. Chronic kidney disease (CKD), stage III (moderate) (HCC)  - VITAMIN D 25 Hydroxy (Vit-D Deficiency, Fractures)  8. Ataxia  - Vitamin B12  9. Late effects of CVA (cerebrovascular accident)   10. History of cardioembolic cerebrovascular accident (CVA) No sign of acute or recent focal event  11. Acute cystitis with hematuria  - ciprofloxacin (CIPRO) 500 MG tablet; Take 1 tablet (500 mg total) by mouth 2 (two) times daily for 7 days.  Dispense: 14 tablet; Refill: 0       Lelon Huh, MD  [facility]

## 2018-01-11 ENCOUNTER — Telehealth: Payer: Self-pay | Admitting: *Deleted

## 2018-01-11 DIAGNOSIS — E7800 Pure hypercholesterolemia, unspecified: Secondary | ICD-10-CM

## 2018-01-11 LAB — COMPREHENSIVE METABOLIC PANEL
ALBUMIN: 4 g/dL (ref 3.5–4.7)
ALK PHOS: 109 IU/L (ref 39–117)
ALT: 17 IU/L (ref 0–32)
AST: 17 IU/L (ref 0–40)
Albumin/Globulin Ratio: 1.4 (ref 1.2–2.2)
BUN/Creatinine Ratio: 11 — ABNORMAL LOW (ref 12–28)
BUN: 10 mg/dL (ref 8–27)
Bilirubin Total: 0.6 mg/dL (ref 0.0–1.2)
CO2: 21 mmol/L (ref 20–29)
CREATININE: 0.91 mg/dL (ref 0.57–1.00)
Calcium: 9.4 mg/dL (ref 8.7–10.3)
Chloride: 104 mmol/L (ref 96–106)
GFR calc Af Amer: 68 mL/min/{1.73_m2} (ref >59)
GFR calc non Af Amer: 59 mL/min/{1.73_m2} — ABNORMAL LOW (ref >59)
GLUCOSE: 78 mg/dL (ref 65–99)
Globulin, Total: 2.8 g/dL (ref 1.5–4.5)
Potassium: 3.9 mmol/L (ref 3.5–5.2)
SODIUM: 142 mmol/L (ref 134–144)
Total Protein: 6.8 g/dL (ref 6.0–8.5)

## 2018-01-11 LAB — CBC
HEMOGLOBIN: 13.1 g/dL (ref 11.1–15.9)
Hematocrit: 39.1 % (ref 34.0–46.6)
MCH: 29.1 pg (ref 26.6–33.0)
MCHC: 33.5 g/dL (ref 31.5–35.7)
MCV: 87 fL (ref 79–97)
PLATELETS: 199 10*3/uL (ref 150–450)
RBC: 4.5 x10E6/uL (ref 3.77–5.28)
RDW: 12.8 % (ref 12.3–15.4)
WBC: 5.8 10*3/uL (ref 3.4–10.8)

## 2018-01-11 LAB — TSH: TSH: 1.57 u[IU]/mL (ref 0.450–4.500)

## 2018-01-11 LAB — LIPID PANEL
CHOL/HDL RATIO: 5.9 ratio — AB (ref 0.0–4.4)
Cholesterol, Total: 206 mg/dL — ABNORMAL HIGH (ref 100–199)
HDL: 35 mg/dL — ABNORMAL LOW (ref >39)
LDL Calculated: 142 mg/dL — ABNORMAL HIGH (ref 0–99)
Triglycerides: 145 mg/dL (ref 0–149)
VLDL Cholesterol Cal: 29 mg/dL (ref 5–40)

## 2018-01-11 LAB — VITAMIN D 25 HYDROXY (VIT D DEFICIENCY, FRACTURES): VIT D 25 HYDROXY: 26.4 ng/mL — AB (ref 30.0–100.0)

## 2018-01-11 LAB — VITAMIN B12: VITAMIN B 12: 494 pg/mL (ref 232–1245)

## 2018-01-11 MED ORDER — SIMVASTATIN 20 MG PO TABS: 20.0000 mg | ORAL_TABLET | Freq: Every day | ORAL | 3 refills | Status: DC

## 2018-01-11 NOTE — Telephone Encounter (Signed)
-----   Message from Birdie Sons, MD sent at 01/11/2018 10:46 AM EDT ----- Cholesterol is high, otherwise labs are normal. Need to increase simvastatin to 20mg  daily, #30, rf to get cholesterol under control and to better prevent future strokes. Rest of blood tests are normal.

## 2018-01-11 NOTE — Telephone Encounter (Signed)
Patient was notified of results. Expressed understanding. Rx sent to pharmacy. 

## 2018-01-12 LAB — CULTURE, URINE COMPREHENSIVE

## 2018-01-17 ENCOUNTER — Ambulatory Visit: Payer: Self-pay | Admitting: Family Medicine

## 2018-01-17 NOTE — Progress Notes (Deleted)
       Patient: Susan Campos Female    DOB: 01/01/1933   84 y.o.   MRN: [id number] Visit Date: 01/17/2018  Today's Provider: Lelon Huh, MD   No chief complaint on file.  Subjective:    HPI  Altered mental status, unspecified altered mental status type From 01/10/2018-Sx likely secondary to UTI as below.   Acute cystitis with hematuria From 01/10/2018-given rx for ciprofloxacin (CIPRO) 500 MG tablet.    Allergies  Allergen Reactions  . Lovastatin Shortness Of Breath and Palpitations  . Aricept [Donepezil Hcl]     dizziness     Current Outpatient Medications:  .  acetaminophen (TYLENOL ARTHRITIS PAIN) 650 MG CR tablet, Take by mouth every 8 (eight) hours as needed. , Disp: , Rfl:  .  albuterol (PROAIR HFA) 108 (90 Base) MCG/ACT inhaler, Inhale 2 puffs into the lungs every 6 (six) hours as needed for wheezing or shortness of breath., Disp: 18 g, Rfl: 1 .  aspirin EC 81 MG tablet, Take 81 mg by mouth daily. 9 am, Disp: , Rfl:  .  calcium carbonate (TUMS) 500 MG chewable tablet, Chew by mouth as needed., Disp: , Rfl:  .  ciprofloxacin (CIPRO) 500 MG tablet, Take 1 tablet (500 mg total) by mouth 2 (two) times daily for 7 days., Disp: 14 tablet, Rfl: 0 .  lisinopril (PRINIVIL,ZESTRIL) 10 MG tablet, Take 1 tablet (10 mg total) by mouth daily., Disp: 30 tablet, Rfl: 11 .  meclizine (ANTIVERT) 12.5 MG tablet, TAKE 1 TABLET(12.5 MG) BY MOUTH THREE TIMES DAILY AS NEEDED FOR DIZZINESS, Disp: 30 tablet, Rfl: 3 .  Multiple Vitamin (MULTIVITAMIN) capsule, Take 1 capsule by mouth daily. 9 AM, Disp: , Rfl:  .  simvastatin (ZOCOR) 20 MG tablet, Take 1 tablet (20 mg total) by mouth at bedtime., Disp: 90 tablet, Rfl: 3  Review of Systems  Constitutional: Negative for appetite change, chills, fatigue and fever.  Respiratory: Negative for chest tightness and shortness of breath.   Cardiovascular: Negative for chest pain and palpitations.  Gastrointestinal: Negative for abdominal  pain, nausea and vomiting.  Neurological: Negative for dizziness and weakness.    Social History   Tobacco Use  . Smoking status: Former Smoker    Packs/day: 0.50    Years: 4.00    Pack years: 2.00    Types: Cigarettes  . Smokeless tobacco: Never Used  . Tobacco comment: 1979  Substance Use Topics  . Alcohol use: No   Objective:   There were no vitals taken for this visit. There were no vitals filed for this visit.   Physical Exam      Assessment & Plan:           Lelon Huh, MD  [facility]

## 2018-01-29 ENCOUNTER — Ambulatory Visit: Payer: Self-pay

## 2018-01-30 ENCOUNTER — Ambulatory Visit (INDEPENDENT_AMBULATORY_CARE_PROVIDER_SITE_OTHER): Payer: Medicare Other | Admitting: Family Medicine

## 2018-01-30 ENCOUNTER — Ambulatory Visit (INDEPENDENT_AMBULATORY_CARE_PROVIDER_SITE_OTHER): Payer: Medicare Other

## 2018-01-30 ENCOUNTER — Encounter: Payer: Self-pay | Admitting: Family Medicine

## 2018-01-30 VITALS — BP 152/74 | HR 78 | Temp 99.0°F | Ht 63.0 in | Wt 187.4 lb

## 2018-01-30 VITALS — BP 140/82

## 2018-01-30 DIAGNOSIS — R609 Edema, unspecified: Secondary | ICD-10-CM | POA: Diagnosis not present

## 2018-01-30 DIAGNOSIS — N183 Chronic kidney disease, stage 3 unspecified: Secondary | ICD-10-CM

## 2018-01-30 DIAGNOSIS — M8580 Other specified disorders of bone density and structure, unspecified site: Secondary | ICD-10-CM | POA: Diagnosis not present

## 2018-01-30 DIAGNOSIS — G309 Alzheimer's disease, unspecified: Secondary | ICD-10-CM | POA: Diagnosis not present

## 2018-01-30 DIAGNOSIS — F0150 Vascular dementia without behavioral disturbance: Secondary | ICD-10-CM

## 2018-01-30 DIAGNOSIS — Z0000 Encounter for general adult medical examination without abnormal findings: Secondary | ICD-10-CM

## 2018-01-30 DIAGNOSIS — I10 Essential (primary) hypertension: Secondary | ICD-10-CM

## 2018-01-30 DIAGNOSIS — E2839 Other primary ovarian failure: Secondary | ICD-10-CM

## 2018-01-30 DIAGNOSIS — E7800 Pure hypercholesterolemia, unspecified: Secondary | ICD-10-CM

## 2018-01-30 DIAGNOSIS — F0280 Dementia in other diseases classified elsewhere without behavioral disturbance: Secondary | ICD-10-CM

## 2018-01-30 MED ORDER — FUROSEMIDE 20 MG PO TABS: 20.0000 mg | ORAL_TABLET | Freq: Every day | ORAL | 3 refills | Status: DC | PRN

## 2018-01-30 MED ORDER — FUROSEMIDE 20 MG PO TABS: 20.0000 mg | ORAL_TABLET | Freq: Every day | ORAL | 3 refills | Status: DC

## 2018-01-30 NOTE — Patient Instructions (Signed)
Preventive Care 82 Years and Older, Female Preventive care refers to lifestyle choices and visits with your health care provider that can promote health and wellness. What does preventive care include?  A yearly physical exam. This is also called an annual well check.  Dental exams once or twice a year.  Routine eye exams. Ask your health care provider how often you should have your eyes checked.  Personal lifestyle choices, including: ? Daily care of your teeth and gums. ? Regular physical activity. ? Eating a healthy diet. ? Avoiding tobacco and drug use. ? Limiting alcohol use. ? Practicing safe sex. ? Taking low-dose aspirin every day. ? Taking vitamin and mineral supplements as recommended by your health care provider. What happens during an annual well check? The services and screenings done by your health care provider during your annual well check will depend on your age, overall health, lifestyle risk factors, and family history of disease. Counseling Your health care provider may ask you questions about your:  Alcohol use.  Tobacco use.  Drug use.  Emotional well-being.  Home and relationship well-being.  Sexual activity.  Eating habits.  History of falls.  Memory and ability to understand (cognition).  Work and work environment.  Reproductive health.  Screening You may have the following tests or measurements:  Height, weight, and BMI.  Blood pressure.  Lipid and cholesterol levels. These may be checked every 5 years, or more frequently if you are over 82 years old.  Skin check.  Lung cancer screening. You may have this screening every year starting at age 82 if you have a 30-pack-year history of smoking and currently smoke or have quit within the past 15 years.  Fecal occult blood test (FOBT) of the stool. You may have this test every year starting at age 82.  Flexible sigmoidoscopy or colonoscopy. You may have a sigmoidoscopy every 5 years or  a colonoscopy every 10 years starting at age 82.  Hepatitis C blood test.  Hepatitis B blood test.  Sexually transmitted disease (STD) testing.  Diabetes screening. This is done by checking your blood sugar (glucose) after you have not eaten for a while (fasting). You may have this done every 1-3 years.  Bone density scan. This is done to screen for osteoporosis. You may have this done starting at age 82.  Mammogram. This may be done every 1-2 years. Talk to your health care provider about how often you should have regular mammograms.  Talk with your health care provider about your test results, treatment options, and if necessary, the need for more tests. Vaccines Your health care provider may recommend certain vaccines, such as:  Influenza vaccine. This is recommended every year.  Tetanus, diphtheria, and acellular pertussis (Tdap, Td) vaccine. You may need a Td booster every 10 years.  Varicella vaccine. You may need this if you have not been vaccinated.  Zoster vaccine. You may need this after age 82.  Measles, mumps, and rubella (MMR) vaccine. You may need at least one dose of MMR if you were born in 1957 or later. You may also need a second dose.  Pneumococcal 13-valent conjugate (PCV13) vaccine. One dose is recommended after age 82.  Pneumococcal polysaccharide (PPSV23) vaccine. One dose is recommended after age 82.  Meningococcal vaccine. You may need this if you have certain conditions.  Hepatitis A vaccine. You may need this if you have certain conditions or if you travel or work in places where you may be exposed to hepatitis   A.  Hepatitis B vaccine. You may need this if you have certain conditions or if you travel or work in places where you may be exposed to hepatitis B.  Haemophilus influenzae type b (Hib) vaccine. You may need this if you have certain conditions.  Talk to your health care provider about which screenings and vaccines you need and how often you  need them. This information is not intended to replace advice given to you by your health care provider. Make sure you discuss any questions you have with your health care provider. Document Released: 08/14/2015 Document Revised: 04/06/2016 Document Reviewed: 05/19/2015 Elsevier Interactive Patient Education  2018 Elsevier Inc[employer]  

## 2018-01-30 NOTE — Progress Notes (Signed)
Subjective:   Susan Campos is a 84 y.o. female who presents for Medicare Annual (Subsequent) preventive examination.  Review of Systems:  N/A  Cardiac Risk Factors include: advanced age (>59men, >45 women);dyslipidemia;hypertension;obesity (BMI >30kg/m2)     Objective:     Vitals: BP (!) 152/74 (BP Location: Right Arm)   Pulse 78   Temp 99 F (37.2 C) (Oral)   Ht 5\' 3"  (1.6 m)   Wt 187 lb 6.4 oz (85 kg)   BMI 33.20 kg/m   Body mass index is 33.2 kg/m.  Advanced Directives 01/30/2018 01/27/2017 06/29/2016 11/12/2015 10/20/2015 05/27/2015  Does Patient Have a Medical Advance Directive? Yes Yes Yes No Yes Yes  Type of Paramedic of [place];Living will Living will;Healthcare Power of [place];Living will - Living will Living will  Does patient want to make changes to medical advance directive? - - No - Patient declined - - No - Patient declined  Copy of [place] in Chart? No - copy requested No - copy requested No - copy requested - - Yes  Would patient like information on creating a medical advance directive? - - - No - patient declined information - -    Tobacco Social History   Tobacco Use  Smoking Status Former Smoker  . Packs/day: 0.50  . Years: 4.00  . Pack years: 2.00  . Types: Cigarettes  Smokeless Tobacco Never Used  Tobacco Comment   1979     Counseling given: Not Answered Comment: 1979   Clinical Intake:  Pre-visit preparation completed: Yes  Pain : No/denies pain Pain Score: 0-No pain     Nutritional Status: BMI > 30  Obese Nutritional Risks: None Diabetes: No  How often do you need to have someone help you when you read instructions, pamphlets, or other written materials from your doctor or pharmacy?: 1 - Never  Interpreter Needed?: No  Information entered by :: [facility], LPN  Past Medical History:  Diagnosis Date  . Arthritis    HIPS  . Asthma    HX OF    . Cancer ([place])    skin  . Hypertension   . Orthopnea    sleeps in recliner, cannot lay flat on her back  . Stroke [facility])    CVA 2012/ MEMORY LOSS, 1999, 1 more in 2011  . Vertigo    OCCASIONAL   Past Surgical History:  Procedure Laterality Date  . CATARACT EXTRACTION Right   . CATARACT EXTRACTION W/PHACO Left 05/27/2015   Procedure: CATARACT EXTRACTION PHACO AND INTRAOCULAR LENS PLACEMENT (IOC);  Surgeon: Leandrew Koyanagi, MD;  Location: [facility];  Service: Ophthalmology;  Laterality: Left;  . KNEE ARTHROSCOPY    . MELANOMA EXCISION     from chest  . MOUTH SURGERY    . REPLACEMENT TOTAL KNEE BILATERAL    . UTERINE SUSPENSION     Family History  Problem Relation Age of Onset  . Heart attack Mother   . Heart attack Father   . Heart attack Paternal Grandmother   . Skin cancer Paternal Grandmother    Social History   Socioeconomic History  . Marital status: Married    Spouse name: Not on file  . Number of children: 2  . Years of education: Not on file  . Highest education level: Associate degree: occupational, Hotel manager, or vocational program  Occupational History  . Occupation: Retired  Scientific laboratory technician  . Financial resource strain: Not hard at all  .  Food insecurity:    Worry: Never true    Inability: Never true  . Transportation needs:    Medical: No    Non-medical: No  Tobacco Use  . Smoking status: Former Smoker    Packs/day: 0.50    Years: 4.00    Pack years: 2.00    Types: Cigarettes  . Smokeless tobacco: Never Used  . Tobacco comment: 1979  Substance and Sexual Activity  . Alcohol use: No  . Drug use: No  . Sexual activity: Not on file  Lifestyle  . Physical activity:    Days per week: Not on file    Minutes per session: Not on file  . Stress: Not at all  Relationships  . Social connections:    Talks on phone: Not on file    Gets together: Not on file    Attends religious service: Not on file    Active member of club or organization:  Not on file    Attends meetings of clubs or organizations: Not on file    Relationship status: Not on file  Other Topics Concern  . Not on file  Social History Narrative  . Not on file    Outpatient Encounter Medications as of 01/30/2018  Medication Sig  . acetaminophen (TYLENOL ARTHRITIS PAIN) 650 MG CR tablet Take by mouth every 8 (eight) hours as needed.   Marland Kitchen albuterol (PROAIR HFA) 108 (90 Base) MCG/ACT inhaler Inhale 2 puffs into the lungs every 6 (six) hours as needed for wheezing or shortness of breath.  Marland Kitchen aspirin EC 81 MG tablet Take 81 mg by mouth daily. 9 am  . calcium carbonate (TUMS) 500 MG chewable tablet Chew by mouth as needed.  . ciprofloxacin (CIPRO) 500 MG tablet Take 500 mg by mouth 2 (two) times daily.  Marland Kitchen lisinopril (PRINIVIL,ZESTRIL) 10 MG tablet Take 1 tablet (10 mg total) by mouth daily.  . meclizine (ANTIVERT) 12.5 MG tablet TAKE 1 TABLET(12.5 MG) BY MOUTH THREE TIMES DAILY AS NEEDED FOR DIZZINESS  . simvastatin (ZOCOR) 20 MG tablet Take 1 tablet (20 mg total) by mouth at bedtime.  . Multiple Vitamin (MULTIVITAMIN) capsule Take 1 capsule by mouth daily. 9 AM   No facility-administered encounter medications on file as of 01/30/2018.     Activities of Daily Living In your present state of health, do you have any difficulty performing the following activities: 01/30/2018  Hearing? N  Vision? N  Difficulty concentrating or making decisions? Y  Walking or climbing stairs? Y  Comment Due to balance and gait issues.   Dressing or bathing? N  Doing errands, shopping? Y  Preparing Food and eating ? Y  Comment Husband cooks.   Using the Toilet? N  In the past six months, have you accidently leaked urine? Y  Comment Wears protection daily.   Do you have problems with loss of bowel control? N  Managing your Medications? N  Managing your Finances? Y  Comment Son manages this.   Housekeeping or managing your Housekeeping? Y  Comment Husband does house work.   Some recent  data might be hidden    Patient Care Team: Birdie Sons, MD as PCP - General (Family Medicine) Leandrew Koyanagi, MD as Referring Physician (Ophthalmology) Vladimir Crofts, MD as Consulting Physician (Neurology)    Assessment:   This is a routine wellness examination for [place].  Exercise Activities and Dietary recommendations Current Exercise Habits: The patient does not participate in regular exercise at present, Exercise limited by:  orthopedic condition(s)  Goals    . Exercise 3x per week (30 min per time)     Recommend to start riding the stationary bike or going to the gym for 3 days a weeks for at least 20 minutes.        Fall Risk Fall Risk  01/30/2018 01/27/2017 01/20/2016 10/20/2015  Falls in the past year? Yes No Yes Yes  Number falls in past yr: 2 or more - 1 1  Injury with Fall? No - No No  Risk for fall due to : History of fall(s);Impaired balance/gait;Impaired mobility - - Impaired balance/gait;History of fall(s)  Follow up Falls prevention discussed - Falls evaluation completed -   Is the patient's home free of loose throw rugs in walkways, pet beds, electrical cords, etc?   yes      Grab bars in the bathroom? yes      Handrails on the stairs?   yes      Adequate lighting?   yes  Timed Get Up and Go performed: N/A  Depression Screen PHQ 2/9 Scores 01/30/2018 01/27/2017 01/27/2017 01/20/2016  PHQ - 2 Score 0 0 0 0  PHQ- 9 Score - 0 - -     Cognitive Function: Pt declined screening today.      6CIT Screen 01/27/2017  What Year? 0 points  What month? 0 points  What time? 0 points  Count back from 20 0 points  Months in reverse 0 points  Repeat phrase 2 points  Total Score 2    Immunization History  Administered Date(s) Administered  . Influenza, High Dose Seasonal PF 07/13/2016, 05/04/2017  . Influenza-Unspecified 05/01/2014  . Pneumococcal Conjugate-13 07/09/2014  . Td 01/13/2005  . Zoster 01/09/2012    Qualifies for Shingles Vaccine? Due for  Shingles vaccine. Declined my offer to administer today. Education has been provided regarding the importance of this vaccine. Pt has been advised to call her insurance company to determine her out of pocket expense. Advised she may also receive this vaccine at her local pharmacy or Health Dept. Verbalized acceptance and understanding.  Screening Tests Health Maintenance  Topic Date Due  . TETANUS/TDAP  08/01/2026 (Originally 01/14/2015)  . INFLUENZA VACCINE  03/01/2018  . DEXA SCAN  Completed  . PNA vac Low Risk Adult  Completed    Cancer Screenings: Lung: Low Dose CT Chest recommended if Age 51-80 years, 30 pack-year currently smoking OR have quit w/in 15years. Patient does not qualify. Breast:  Up to date on Mammogram? Yes   Up to date of Bone Density/Dexa? Yes Colorectal: N/A  Additional Screenings:  Hepatitis C Screening: N/A     Plan:  I have personally reviewed and addressed the Medicare Annual Wellness questionnaire and have noted the following in the patient's chart:  A. Medical and social history B. Use of alcohol, tobacco or illicit drugs  C. Current medications and supplements D. Functional ability and status E.  Nutritional status F.  Physical activity G. Advance directives H. List of other physicians I.  Hospitalizations, surgeries, and ER visits in previous 12 months J.  [place] such as hearing and vision if needed, cognitive and depression L. Referrals and appointments - none  In addition, I have reviewed and discussed with patient certain preventive protocols, quality metrics, and best practice recommendations. A written personalized care plan for preventive services as well as general preventive health recommendations were provided to patient.  See attached scanned questionnaire for additional information.   Signed,  Alyson Ingles  [facility], LPN Nurse Health Advisor   Nurse Recommendations: Pt declined the tetanus vaccine today.

## 2018-01-30 NOTE — Progress Notes (Signed)
Patient: Susan Campos, Female    DOB: [birth year], 84 y.o.   MRN: [id number] Visit Date: 01/30/2018  Today's Provider: Lelon Huh, MD   Chief Complaint  Patient presents with  . Annual Exam  . Hypertension  . Chronic Kidney Disease   Subjective:   Patient saw [facility]enzie for AWV today at 2:00 pm.      Hypertension, follow-up:  BP Readings from Last 3 Encounters:  01/30/18 140/82  01/30/18 (!) 152/74  01/10/18 134/84    She was last seen for hypertension 1 months ago.  BP at that visit was 134/84. Management since that visit includes; no changes.She reports good compliance with treatment. She is not having side effects.  She is not exercising. She is adherent to low salt diet.   Outside blood pressures are not being checked. She is experiencing lower extremity edema.  Patient denies chest pain, chest pressure/discomfort, claudication, dyspnea, exertional chest pressure/discomfort, irregular heart beat, near-syncope, orthopnea, palpitations, paroxysmal nocturnal dyspnea, syncope and tachypnea.   Cardiovascular risk factors include advanced age (older than 33 for men, 70 for women) and hypertension.  Use of agents associated with hypertension: NSAIDS.   ------------------------------------------------------------------------    Lipid/Cholesterol, Follow-up:   Last seen for this 1 months ago.  Management since that visit include; labs checked, increased simvastatin to 20 mg qd.  Last Lipid Panel:    Component Value Date/Time   CHOL 206 (H) 01/10/2018 1228   TRIG 145 01/10/2018 1228   HDL 35 (L) 01/10/2018 1228   CHOLHDL 5.9 (H) 01/10/2018 1228   LDLCALC 142 (H) 01/10/2018 1228    She reports good compliance with treatment. She is not having side effects.   Wt Readings from Last 3 Encounters:  01/30/18 187 lb 6.4 oz (85 kg)  01/10/18 183 lb (83 kg)  09/28/17 185 lb (83.9 kg)     ------------------------------------------------------------------------   Osteopenia, unspecified location From 01/10/2018-labs checked, no changes made. Last BMD was in 2015 Lab Results  Component Value Date   VD25OH 26.4 (L) 01/10/2018    Altered mental status, unspecified altered mental status type From 01/10/2018-Sx likely secondary to UTI as below. Today patient reports this problem has resolved since last visit.   Mixed Alzheimer's and vascular dementia Continue routine follow up neurology. Patient comes in today with her husband reporting that this problem is stable.   Chronic kidney disease (CKD), stage III (moderate) (HCC) From 01/10/2018-labs checked, no changes.  She has had some trouble with swelling in both ankles the last couple of weeks. No dyspnea. No change in diet. No pain in ankles. No redness or injuries know.    Review of Systems  Constitutional: Negative for chills, fatigue and fever.  HENT: Negative for congestion, ear pain, rhinorrhea, sneezing and sore throat.   Eyes: Negative.  Negative for pain and redness.  Respiratory: Negative for cough, shortness of breath and wheezing.   Cardiovascular: Positive for leg swelling. Negative for chest pain.  Gastrointestinal: Negative for abdominal pain, blood in stool, constipation, diarrhea and nausea.  Endocrine: Negative for polydipsia and polyphagia.  Genitourinary: Negative.  Negative for dysuria, flank pain, hematuria, pelvic pain, vaginal bleeding and vaginal discharge.  Musculoskeletal: Negative for arthralgias, back pain, gait problem and joint swelling.  Skin: Negative for rash.  Neurological: Negative.  Negative for dizziness, tremors, seizures, weakness, light-headedness, numbness and headaches.  Hematological: Negative for adenopathy.  Psychiatric/Behavioral: Negative.  Negative for behavioral problems, confusion and dysphoric mood. The patient is not nervous/anxious  and is not hyperactive.      Social History   Socioeconomic History  . Marital status: Married    Spouse name: Not on file  . Number of children: 2  . Years of education: Not on file  . Highest education level: Associate degree: occupational, Hotel manager, or vocational program  Occupational History  . Occupation: Retired  Scientific laboratory technician  . Financial resource strain: Not hard at all  . Food insecurity:    Worry: Never true    Inability: Never true  . Transportation needs:    Medical: No    Non-medical: No  Tobacco Use  . Smoking status: Former Smoker    Packs/day: 0.50    Years: 4.00    Pack years: 2.00    Types: Cigarettes  . Smokeless tobacco: Never Used  . Tobacco comment: 1979  Substance and Sexual Activity  . Alcohol use: No  . Drug use: No  . Sexual activity: Not on file  Lifestyle  . Physical activity:    Days per week: Not on file    Minutes per session: Not on file  . Stress: Not at all  Relationships  . Social connections:    Talks on phone: Not on file    Gets together: Not on file    Attends religious service: Not on file    Active member of club or organization: Not on file    Attends meetings of clubs or organizations: Not on file    Relationship status: Not on file  . Intimate partner violence:    Fear of current or ex partner: Not on file    Emotionally abused: Not on file    Physically abused: Not on file    Forced sexual activity: Not on file  Other Topics Concern  . Not on file  Social History Narrative  . Not on file    Past Medical History:  Diagnosis Date  . Arthritis    HIPS  . Asthma    HX OF  . Cancer ([place])    skin  . Hypertension   . Orthopnea    sleeps in recliner, cannot lay flat on her back  . Stroke [facility])    CVA 2012/ MEMORY LOSS, 1999, 1 more in 2011  . Vertigo    OCCASIONAL     Patient Active Problem List   Diagnosis Date Noted  . Mixed Alzheimer's and vascular dementia 05/25/2016  . Obesity (BMI 30.0-34.9) 05/25/2016  . Late effects of  CVA (cerebrovascular accident) 05/05/2016  . Ataxia 04/20/2016  . Osteopenia 01/09/2016  . Arthritis 09/24/2015  . Airway hyperreactivity 09/24/2015  . Benign hypertension 09/24/2015  . Chronic kidney disease (CKD), stage III (moderate) ([place]) 09/24/2015  . Colitis 09/24/2015  . Esophagitis, reflux 09/24/2015  . History of cardioembolic cerebrovascular accident (CVA) 09/24/2015  . Decreased potassium in the blood 09/24/2015  . History of malignant melanoma of skin 09/24/2015  . Obesity 09/24/2015  . Cerebrovascular disease 09/24/2015  . Hypercholesteremia 08/17/2015    Past Surgical History:  Procedure Laterality Date  . CATARACT EXTRACTION Right   . CATARACT EXTRACTION W/PHACO Left 05/27/2015   Procedure: CATARACT EXTRACTION PHACO AND INTRAOCULAR LENS PLACEMENT (IOC);  Surgeon: Leandrew Koyanagi, MD;  Location: [facility];  Service: Ophthalmology;  Laterality: Left;  . KNEE ARTHROSCOPY    . MELANOMA EXCISION     from chest  . MOUTH SURGERY    . REPLACEMENT TOTAL KNEE BILATERAL    . UTERINE SUSPENSION  Her family history includes Heart attack in her father, mother, and paternal grandmother; Skin cancer in her paternal grandmother.      Current Outpatient Medications:  .  acetaminophen (TYLENOL ARTHRITIS PAIN) 650 MG CR tablet, Take by mouth every 8 (eight) hours as needed. , Disp: , Rfl:  .  albuterol (PROAIR HFA) 108 (90 Base) MCG/ACT inhaler, Inhale 2 puffs into the lungs every 6 (six) hours as needed for wheezing or shortness of breath., Disp: 18 g, Rfl: 1 .  aspirin EC 81 MG tablet, Take 81 mg by mouth daily. 9 am, Disp: , Rfl:  .  calcium carbonate (TUMS) 500 MG chewable tablet, Chew by mouth as needed., Disp: , Rfl:  .  ciprofloxacin (CIPRO) 500 MG tablet, Take 500 mg by mouth 2 (two) times daily., Disp: , Rfl:  .  lisinopril (PRINIVIL,ZESTRIL) 10 MG tablet, Take 1 tablet (10 mg total) by mouth daily., Disp: 30 tablet, Rfl: 11 .  meclizine (ANTIVERT) 12.5  MG tablet, TAKE 1 TABLET(12.5 MG) BY MOUTH THREE TIMES DAILY AS NEEDED FOR DIZZINESS, Disp: 30 tablet, Rfl: 3 .  Multiple Vitamin (MULTIVITAMIN) capsule, Take 1 capsule by mouth daily. 9 AM, Disp: , Rfl:  .  simvastatin (ZOCOR) 20 MG tablet, Take 1 tablet (20 mg total) by mouth at bedtime., Disp: 90 tablet, Rfl: 3  Patient Care Team: Birdie Sons, MD as PCP - General (Family Medicine) Leandrew Koyanagi, MD as Referring Physician (Ophthalmology) Vladimir Crofts, MD as Consulting Physician (Neurology)     Objective:   Vitals: BP 140/82 (BP Location: Left Arm, Patient Position: Sitting, Cuff Size: Large)  Vitals:   BP 152/74 (BP Location: Right Arm)   Pulse 78   Temp 99 F (37.2 C) (Oral)   Ht 5\' 3"  (1.6 m)   Wt 187 lb 6.4 oz (85 kg)   BMI 33.20 kg/m   BSA 1.94 m        Physical Exam   General Appearance:    Alert, cooperative, no distress  Eyes:    PERRL, conjunctiva/corneas clear, EOM's intact       Lungs:     Clear to auscultation bilaterally, respirations unlabored  Heart:    Regular rate and rhythm, 2+ bilateral ankle edema.   Neurologic:   Awake, alert, oriented x 3. No apparent focal neurological           defect.        Activities of Daily Living In your present state of health, do you have any difficulty performing the following activities: 01/30/2018  Hearing? N  Vision? N  Difficulty concentrating or making decisions? Y  Walking or climbing stairs? Y  Comment Due to balance and gait issues.   Dressing or bathing? N  Doing errands, shopping? Y  Preparing Food and eating ? Y  Comment Husband cooks.   Using the Toilet? N  In the past six months, have you accidently leaked urine? Y  Comment Wears protection daily.   Do you have problems with loss of bowel control? N  Managing your Medications? N  Managing your Finances? Y  Comment Son manages this.   Housekeeping or managing your Housekeeping? Y  Comment Husband does house work.   Some recent data  might be hidden    Fall Risk Assessment Fall Risk  01/30/2018 01/27/2017 01/20/2016 10/20/2015  Falls in the past year? Yes No Yes Yes  Number falls in past yr: 2 or more - 1 1  Injury with Fall? No -  No No  Risk for fall due to : History of fall(s);Impaired balance/gait;Impaired mobility - - Impaired balance/gait;History of fall(s)  Follow up Falls prevention discussed - Falls evaluation completed -     Depression Screen PHQ 2/9 Scores 01/30/2018 01/27/2017 01/27/2017 01/20/2016  PHQ - 2 Score 0 0 0 0  PHQ- 9 Score - 0 - -      Assessment & Plan:    Annual Physical Reviewed patient's Family Medical History Reviewed and updated list of patient's medical providers Assessment of cognitive impairment was done Assessed patient's functional ability Established a written schedule for health screening [place] Completed and Reviewed  Exercise Activities and Dietary recommendations Goals    . Exercise 3x per week (30 min per time)     Recommend to start riding the stationary bike or going to the gym for 3 days a weeks for at least 20 minutes.        Immunization History  Administered Date(s) Administered  . Influenza, High Dose Seasonal PF 07/13/2016, 05/04/2017  . Influenza-Unspecified 05/01/2014  . Pneumococcal Conjugate-13 07/09/2014  . Td 01/13/2005  . Zoster 01/09/2012    Health Maintenance  Topic Date Due  . TETANUS/TDAP  08/01/2026 (Originally 01/14/2015)  . INFLUENZA VACCINE  03/01/2018  . DEXA SCAN  Completed  . PNA vac Low Risk Adult  Completed     Discussed health benefits of physical activity, and encouraged her to engage in regular exercise appropriate for her age and condition.    ------------------------------------------------------------------------------------------------------------  1. Edema, unspecified type Encouraged to keep legs elevated when  - furosemide (LASIX) 20 MG tablet; Take 1 tablet (20 mg total) by mouth daily as  needed.  Dispense: 30 tablet; Refill: 3  2. Estrogen deficiency  - DG Bone Density; Future  3. Mixed Alzheimer's and vascular dementia Stable, continue routine follow up neurology.   4. Osteopenia, unspecified location Due for BMD as above.   5. Benign hypertension Stable, continue current medications.   6. Chronic kidney disease (CKD), stage III (moderate) (HCC) Continue current BP control regiment.   7. Hypercholesteremia Doing well with increased dose of simvastatin.   She and her husband who is present state they both had pneumonia vaccine when they turned 51. However, no immunization were sent by Dr. Samuel Germany office who was her PCP at that time.   Lelon Huh, MD  [facility]

## 2018-01-30 NOTE — Patient Instructions (Signed)
Susan Campos , Thank you for taking time to come for your Medicare Wellness Visit. I appreciate your ongoing commitment to your health goals. Please review the following plan we discussed and let me know if I can assist you in the future.   Screening recommendations/referrals: Colonoscopy: N/A Mammogram: Up to date Bone Density: Up to date Recommended yearly ophthalmology/optometry visit for glaucoma screening and checkup Recommended yearly dental visit for hygiene and checkup  Vaccinations: Influenza vaccine: Up to date Pneumococcal vaccine: Up to date Tdap vaccine: Pt declines today.  Shingles vaccine: Pt declines today.     Advanced directives: Please bring a copy of your POA (Power of Attorney) and/or Living Will to your next appointment.   Conditions/risks identified: Obesity- recommend to start riding the stationary bike or going to the gym for 3 days a weeks for at least 20 minutes.   Next appointment: 2:00 PM today with Dr Caryn Section.    Preventive Care 82 Years and Older, Female Preventive care refers to lifestyle choices and visits with your health care provider that can promote health and wellness. What does preventive care include?  A yearly physical exam. This is also called an annual well check.  Dental exams once or twice a year.  Routine eye exams. Ask your health care provider how often you should have your eyes checked.  Personal lifestyle choices, including:  Daily care of your teeth and gums.  Regular physical activity.  Eating a healthy diet.  Avoiding tobacco and drug use.  Limiting alcohol use.  Practicing safe sex.  Taking low-dose aspirin every day.  Taking vitamin and mineral supplements as recommended by your health care provider. What happens during an annual well check? The services and screenings done by your health care provider during your annual well check will depend on your age, overall health, lifestyle risk factors, and family history  of disease. Counseling  Your health care provider may ask you questions about your:  Alcohol use.  Tobacco use.  Drug use.  Emotional well-being.  Home and relationship well-being.  Sexual activity.  Eating habits.  History of falls.  Memory and ability to understand (cognition).  Work and work Statistician.  Reproductive health. Screening  You may have the following tests or measurements:  Height, weight, and BMI.  Blood pressure.  Lipid and cholesterol levels. These may be checked every 5 years, or more frequently if you are over 2 years old.  Skin check.  Lung cancer screening. You may have this screening every year starting at age 23 if you have a 30-pack-year history of smoking and currently smoke or have quit within the past 15 years.  Fecal occult blood test (FOBT) of the stool. You may have this test every year starting at age 60.  Flexible sigmoidoscopy or colonoscopy. You may have a sigmoidoscopy every 5 years or a colonoscopy every 10 years starting at age 82.  Hepatitis C blood test.  Hepatitis B blood test.  Sexually transmitted disease (STD) testing.  Diabetes screening. This is done by checking your blood sugar (glucose) after you have not eaten for a while (fasting). You may have this done every 1-3 years.  Bone density scan. This is done to screen for osteoporosis. You may have this done starting at age 82.  Mammogram. This may be done every 1-2 years. Talk to your health care provider about how often you should have regular mammograms. Talk with your health care provider about your test results, treatment options, and if necessary,  the need for more tests. Vaccines  Your health care provider may recommend certain vaccines, such as:  Influenza vaccine. This is recommended every year.  Tetanus, diphtheria, and acellular pertussis (Tdap, Td) vaccine. You may need a Td booster every 10 years.  Zoster vaccine. You may need this after age  82.  Pneumococcal 13-valent conjugate (PCV13) vaccine. One dose is recommended after age 82.  Pneumococcal polysaccharide (PPSV23) vaccine. One dose is recommended after age 82. Talk to your health care provider about which screenings and vaccines you need and how often you need them. This information is not intended to replace advice given to you by your health care provider. Make sure you discuss any questions you have with your health care provider. Document Released: 08/14/2015 Document Revised: 04/06/2016 Document Reviewed: 05/19/2015 Elsevier Interactive Patient Education  2017 [place] Prevention in the Home Falls can cause injuries. They can happen to people of all ages. There are many things you can do to make your home safe and to help prevent falls. What can I do on the outside of my home?  Regularly fix the edges of walkways and driveways and fix any cracks.  Remove anything that might make you trip as you walk through a door, such as a raised step or threshold.  Trim any bushes or trees on the path to your home.  Use bright outdoor lighting.  Clear any walking paths of anything that might make someone trip, such as rocks or tools.  Regularly check to see if handrails are loose or broken. Make sure that both sides of any steps have handrails.  Any raised decks and porches should have guardrails on the edges.  Have any leaves, snow, or ice cleared regularly.  Use sand or salt on walking paths during winter.  Clean up any spills in your garage right away. This includes oil or grease spills. What can I do in the bathroom?  Use night lights.  Install grab bars by the toilet and in the tub and shower. Do not use towel bars as grab bars.  Use non-skid mats or decals in the tub or shower.  If you need to sit down in the shower, use a plastic, non-slip stool.  Keep the floor dry. Clean up any water that spills on the floor as soon as it happens.  Remove  soap buildup in the tub or shower regularly.  Attach bath mats securely with double-sided non-slip rug tape.  Do not have throw rugs and other things on the floor that can make you trip. What can I do in the bedroom?  Use night lights.  Make sure that you have a light by your bed that is easy to reach.  Do not use any sheets or blankets that are too big for your bed. They should not hang down onto the floor.  Have a firm chair that has side arms. You can use this for support while you get dressed.  Do not have throw rugs and other things on the floor that can make you trip. What can I do in the kitchen?  Clean up any spills right away.  Avoid walking on wet floors.  Keep items that you use a lot in easy-to-reach places.  If you need to reach something above you, use a strong step stool that has a grab bar.  Keep electrical cords out of the way.  Do not use floor polish or wax that makes floors slippery. If you must use  wax, use non-skid floor wax.  Do not have throw rugs and other things on the floor that can make you trip. What can I do with my stairs?  Do not leave any items on the stairs.  Make sure that there are handrails on both sides of the stairs and use them. Fix handrails that are broken or loose. Make sure that handrails are as long as the stairways.  Check any carpeting to make sure that it is firmly attached to the stairs. Fix any carpet that is loose or worn.  Avoid having throw rugs at the top or bottom of the stairs. If you do have throw rugs, attach them to the floor with carpet tape.  Make sure that you have a light switch at the top of the stairs and the bottom of the stairs. If you do not have them, ask someone to add them for you. What else can I do to help prevent falls?  Wear shoes that:  Do not have high heels.  Have rubber bottoms.  Are comfortable and fit you well.  Are closed at the toe. Do not wear sandals.  If you use a  stepladder:  Make sure that it is fully opened. Do not climb a closed stepladder.  Make sure that both sides of the stepladder are locked into place.  Ask someone to hold it for you, if possible.  Clearly mark and make sure that you can see:  Any grab bars or handrails.  First and last steps.  Where the edge of each step is.  Use tools that help you move around (mobility aids) if they are needed. These include:  Canes.  Walkers.  Scooters.  Crutches.  Turn on the lights when you go into a dark area. Replace any light bulbs as soon as they burn out.  Set up your furniture so you have a clear path. Avoid moving your furniture around.  If any of your floors are uneven, fix them.  If there are any pets around you, be aware of where they are.  Review your medicines with your doctor. Some medicines can make you feel dizzy. This can increase your chance of falling. Ask your doctor what other things that you can do to help prevent falls. This information is not intended to replace advice given to you by your health care provider. Make sure you discuss any questions you have with your health care provider. Document Released: 05/14/2009 Document Revised: 12/24/2015 Document Reviewed: 08/22/2014 Elsevier Interactive Patient Education  2017 [employer].

## 2018-02-07 ENCOUNTER — Other Ambulatory Visit: Payer: Medicare Other

## 2018-04-16 ENCOUNTER — Ambulatory Visit: Payer: Medicare Other | Admitting: Family Medicine

## 2018-04-16 ENCOUNTER — Encounter: Payer: Self-pay | Admitting: Family Medicine

## 2018-04-16 ENCOUNTER — Ambulatory Visit (INDEPENDENT_AMBULATORY_CARE_PROVIDER_SITE_OTHER): Payer: Medicare Other | Admitting: Family Medicine

## 2018-04-16 VITALS — BP 146/79 | HR 81 | Temp 98.1°F | Resp 18 | Wt 189.0 lb

## 2018-04-16 DIAGNOSIS — R319 Hematuria, unspecified: Secondary | ICD-10-CM

## 2018-04-16 DIAGNOSIS — N390 Urinary tract infection, site not specified: Secondary | ICD-10-CM

## 2018-04-16 LAB — POCT URINALYSIS DIPSTICK
Appearance: ABNORMAL
Bilirubin, UA: NEGATIVE
Glucose, UA: NEGATIVE
Nitrite, UA: POSITIVE
Odor: ABNORMAL
PH UA: 6 (ref 5.0–8.0)
Protein, UA: POSITIVE — AB
SPEC GRAV UA: 1.015 (ref 1.010–1.025)
UROBILINOGEN UA: 1 U/dL

## 2018-04-16 MED ORDER — NITROFURANTOIN MACROCRYSTAL 100 MG PO CAPS: 100.0000 mg | ORAL_CAPSULE | Freq: Two times a day (BID) | ORAL | 0 refills | Status: AC

## 2018-04-16 NOTE — Progress Notes (Deleted)
       Patient: Susan Campos Female    DOB: [birth year]   84 y.o.   MRN: [id number] Visit Date: 04/16/2018  Today's Provider: Lelon Huh, MD   No chief complaint on file.  Subjective:    HPI    Allergies  Allergen Reactions  . Lovastatin Shortness Of Breath and Palpitations  . Aricept [Donepezil Hcl]     dizziness     Current Outpatient Medications:  .  acetaminophen (TYLENOL ARTHRITIS PAIN) 650 MG CR tablet, Take by mouth every 8 (eight) hours as needed. , Disp: , Rfl:  .  albuterol (PROAIR HFA) 108 (90 Base) MCG/ACT inhaler, Inhale 2 puffs into the lungs every 6 (six) hours as needed for wheezing or shortness of breath., Disp: 18 g, Rfl: 1 .  aspirin EC 81 MG tablet, Take 81 mg by mouth daily. 9 am, Disp: , Rfl:  .  calcium carbonate (TUMS) 500 MG chewable tablet, Chew by mouth as needed., Disp: , Rfl:  .  ciprofloxacin (CIPRO) 500 MG tablet, Take 500 mg by mouth 2 (two) times daily., Disp: , Rfl:  .  furosemide (LASIX) 20 MG tablet, Take 1 tablet (20 mg total) by mouth daily as needed for edema., Disp: 30 tablet, Rfl: 3 .  lisinopril (PRINIVIL,ZESTRIL) 10 MG tablet, Take 1 tablet (10 mg total) by mouth daily., Disp: 30 tablet, Rfl: 11 .  meclizine (ANTIVERT) 12.5 MG tablet, TAKE 1 TABLET(12.5 MG) BY MOUTH THREE TIMES DAILY AS NEEDED FOR DIZZINESS, Disp: 30 tablet, Rfl: 3 .  Multiple Vitamin (MULTIVITAMIN) capsule, Take 1 capsule by mouth daily. 9 AM, Disp: , Rfl:  .  simvastatin (ZOCOR) 20 MG tablet, Take 1 tablet (20 mg total) by mouth at bedtime., Disp: 90 tablet, Rfl: 3  Review of Systems  Constitutional: Negative for appetite change, chills, fatigue and fever.  Respiratory: Negative for chest tightness and shortness of breath.   Cardiovascular: Negative for chest pain and palpitations.  Gastrointestinal: Negative for abdominal pain, nausea and vomiting.  Neurological: Negative for dizziness and weakness.    Social History   Tobacco Use  . Smoking  status: Former Smoker    Packs/day: 0.50    Years: 4.00    Pack years: 2.00    Types: Cigarettes  . Smokeless tobacco: Never Used  . Tobacco comment: 1979  Substance Use Topics  . Alcohol use: No   Objective:   There were no vitals taken for this visit. There were no vitals filed for this visit.   Physical Exam      Assessment & Plan:           Lelon Huh, MD  [facility]

## 2018-04-16 NOTE — Patient Instructions (Signed)
   Keep your left leg elevated whenever you are not walking.

## 2018-04-16 NOTE — Progress Notes (Addendum)
Patient: Susan Campos Female    DOB: [birth year]   84 y.o.   MRN: [id number] Visit Date: 04/16/2018  Today's Provider: Lelon Huh, MD   Chief Complaint  Patient presents with  . Hypertension   Subjective:    HPI  Patient states her home health nurse wanted her to come in to see her pcp. Home health nurse was having a hard time getting patient's blood pressure, due to cuff causing her pain. Last reading was 100/96. However, on further questioning, patient states it was the nurse at her neurologist office last week when her BP was recorded at 180/100.  Patient also states she is having difficulty urinating and has had fowl smelling urine. . Patient is also still having swelling in her feet and ankles.    Allergies  Allergen Reactions  . Lovastatin Shortness Of Breath and Palpitations  . Aricept [Donepezil Hcl]     dizziness     Current Outpatient Medications:  .  acetaminophen (TYLENOL ARTHRITIS PAIN) 650 MG CR tablet, Take by mouth every 8 (eight) hours as needed. , Disp: , Rfl:  .  albuterol (PROAIR HFA) 108 (90 Base) MCG/ACT inhaler, Inhale 2 puffs into the lungs every 6 (six) hours as needed for wheezing or shortness of breath., Disp: 18 g, Rfl: 1 .  aspirin EC 81 MG tablet, Take 81 mg by mouth daily. 9 am, Disp: , Rfl:  .  calcium carbonate (TUMS) 500 MG chewable tablet, Chew by mouth as needed., Disp: , Rfl:  .  furosemide (LASIX) 20 MG tablet, Take 1 tablet (20 mg total) by mouth daily as needed for edema., Disp: 30 tablet, Rfl: 3 .  lisinopril (PRINIVIL,ZESTRIL) 10 MG tablet, Take 1 tablet (10 mg total) by mouth daily., Disp: 30 tablet, Rfl: 11 .  Multiple Vitamin (MULTIVITAMIN) capsule, Take 1 capsule by mouth daily. 9 AM, Disp: , Rfl:  .  simvastatin (ZOCOR) 20 MG tablet, Take 1 tablet (20 mg total) by mouth at bedtime., Disp: 90 tablet, Rfl: 3 .  ciprofloxacin (CIPRO) 500 MG tablet, Take 500 mg by mouth 2 (two) times daily., Disp: , Rfl:  .  meclizine  (ANTIVERT) 12.5 MG tablet, TAKE 1 TABLET(12.5 MG) BY MOUTH THREE TIMES DAILY AS NEEDED FOR DIZZINESS (Patient not taking: Reported on 04/16/2018), Disp: 30 tablet, Rfl: 3  Review of Systems  Constitutional: Negative for appetite change, chills, fatigue and fever.  Respiratory: Negative for chest tightness and shortness of breath.   Cardiovascular: Negative for chest pain and palpitations.  Gastrointestinal: Negative for abdominal pain, nausea and vomiting.  Genitourinary: Positive for difficulty urinating.  Neurological: Negative for dizziness and weakness.    Social History   Tobacco Use  . Smoking status: Former Smoker    Packs/day: 0.50    Years: 4.00    Pack years: 2.00    Types: Cigarettes  . Smokeless tobacco: Never Used  . Tobacco comment: 1979  Substance Use Topics  . Alcohol use: No   Objective:   BP (!) 146/79 (BP Location: Right Arm, Patient Position: Sitting, Cuff Size: Large)   Pulse 81   Temp 98.1 F (36.7 C) (Oral)   Resp 18   Wt 189 lb (85.7 kg)   SpO2 97%   BMI 33.48 kg/m  Vitals:   04/16/18 1523  BP: (!) 146/79  Pulse: 81  Resp: 18  Temp: 98.1 F (36.7 C)  TempSrc: Oral  SpO2: 97%  Weight: 189 lb (85.7 kg)  Physical Exam   General Appearance:    Alert, cooperative, no distress  Eyes:    PERRL, conjunctiva/corneas clear, EOM's intact       Lungs:     Clear to auscultation bilaterally, respirations unlabored  Heart:    Regular rate and rhythm, 2+ pedal edema  Neurologic:   Awake, alert, oriented x 3. No apparent focal neurological           defect. Generalized weakness. Unable to stand without assistance.       Results for orders placed or performed in visit on 04/16/18  POCT urinalysis dipstick  Result Value Ref Range   Color, UA Dark Yellow    Clarity, UA Cloudy    Glucose, UA Negative Negative   Bilirubin, UA Neg    Ketones, UA Trace    Spec Grav, UA 1.015 1.010 - 1.025   Blood, UA Trace NonHemolyzed    pH, UA 6.0 5.0 - 8.0    Protein, UA Positive (A) Negative   Urobilinogen, UA 1.0 0.2 or 1.0 E.U./dL   Nitrite, UA Positive    Leukocytes, UA Moderate (2+) (A) Negative   Appearance Abnormal    Odor Abnormal        Assessment & Plan:     1. Urinary tract infection with hematuria, site unspecified  - POCT urinalysis dipstick - CULTURE, URINE COMPREHENSIVE  BP is normal in office today. Continue current medications.    Low BP likely secondary to UTI. She has significant generalized weakness which are limiting ADLs. Will need physical therapy if not improving with treatment of UTI.       Lelon Huh, MD  [facility]

## 2018-04-18 LAB — CULTURE, URINE COMPREHENSIVE

## 2018-04-19 ENCOUNTER — Telehealth: Payer: Self-pay | Admitting: *Deleted

## 2018-04-19 NOTE — Telephone Encounter (Signed)
-----   Message from Birdie Sons, MD sent at 04/18/2018  4:45 PM EDT ----- Urine culture shows infection sensitive to antibiotic that was prescribed. Symptoms should completely resolve by the time antibiotic is finished. Call back otherwise. \

## 2018-04-19 NOTE — Telephone Encounter (Signed)
No answer and no vm

## 2018-04-20 NOTE — Telephone Encounter (Signed)
No answer and no vm

## 2018-04-23 NOTE — Telephone Encounter (Signed)
Patient advised as below. Patient verbalizes understanding and is in agreement with treatment plan.  

## 2018-05-04 ENCOUNTER — Telehealth: Payer: Self-pay | Admitting: Family Medicine

## 2018-05-04 NOTE — Telephone Encounter (Signed)
Sonia Baller with [facility] is calling for verbal orders for Pt  1 week 1 time and 2 times a week for four weeks and 1 time for 1 week  CB#  [phone]  Thanks teri

## 2018-05-04 NOTE — Telephone Encounter (Signed)
That's fine

## 2018-05-04 NOTE — Telephone Encounter (Signed)
Please advise 

## 2018-05-07 NOTE — Telephone Encounter (Signed)
Sonia Baller advised.   Thanks,   -Mickel Baas

## 2018-05-23 DIAGNOSIS — G309 Alzheimer's disease, unspecified: Secondary | ICD-10-CM

## 2018-05-23 DIAGNOSIS — I10 Essential (primary) hypertension: Secondary | ICD-10-CM

## 2018-05-23 DIAGNOSIS — F0150 Vascular dementia without behavioral disturbance: Secondary | ICD-10-CM

## 2018-05-23 DIAGNOSIS — R2681 Unsteadiness on feet: Secondary | ICD-10-CM | POA: Diagnosis not present

## 2018-05-23 DIAGNOSIS — I69318 Other symptoms and signs involving cognitive functions following cerebral infarction: Secondary | ICD-10-CM | POA: Diagnosis not present

## 2018-05-23 DIAGNOSIS — I69398 Other sequelae of cerebral infarction: Secondary | ICD-10-CM

## 2018-05-24 ENCOUNTER — Telehealth: Payer: Self-pay | Admitting: Family Medicine

## 2018-05-24 NOTE — Telephone Encounter (Signed)
That's fine
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
## 2018-05-24 NOTE — Telephone Encounter (Signed)
Amy advised.  ° ° °Thanks,  ° °-Glendola Friedhoff °

## 2018-05-27 ENCOUNTER — Other Ambulatory Visit: Payer: Self-pay | Admitting: Family Medicine

## 2018-05-27 DIAGNOSIS — R609 Edema, unspecified: Secondary | ICD-10-CM

## 2018-05-28 ENCOUNTER — Other Ambulatory Visit: Payer: Self-pay | Admitting: Family Medicine

## 2018-05-28 DIAGNOSIS — R609 Edema, unspecified: Secondary | ICD-10-CM

## 2018-06-04 ENCOUNTER — Telehealth: Payer: Self-pay | Admitting: Family Medicine

## 2018-06-04 NOTE — Telephone Encounter (Signed)
Susan Campos, PT w/ [facility] [phone]  Verbal Orders:  Discharging pt later this week at next visit. Improved minimally and at max potential. Tires quickly, using walker- safety is 1st concern for pt.   Please advise.  Thanks, [employer]

## 2018-06-05 NOTE — Telephone Encounter (Signed)
Please clarify message. Is she wanting orders for more PT or other orders? Does she need follow up o.v. To re-evaluate fatigue?

## 2018-06-05 NOTE — Telephone Encounter (Signed)
I called and spoke with physical therapist Amy. She states she was just calling to give Dr. Caryn Section an update on patients progress, and to let him know that she would be discharging patient later this week. She doesn't think patient needs a follow up office visit for fatigue because patient hasn't had any medical changes. She states that patient has had some minimal improvement, but she feels that she has reached her maximum potential. Patient is very concerned about falling, so she uses a walker to ambulate which has helped.  Amy states she doesn't need any verbal orders at this time.

## 2018-08-08 ENCOUNTER — Ambulatory Visit: Payer: Self-pay | Admitting: Family Medicine

## 2019-07-29 ENCOUNTER — Other Ambulatory Visit: Payer: Self-pay | Admitting: Family Medicine

## 2019-07-29 DIAGNOSIS — R609 Edema, unspecified: Secondary | ICD-10-CM

## 2019-07-30 ENCOUNTER — Other Ambulatory Visit: Payer: Self-pay | Admitting: Family Medicine

## 2019-07-30 DIAGNOSIS — E7800 Pure hypercholesterolemia, unspecified: Secondary | ICD-10-CM

## 2019-07-30 NOTE — Telephone Encounter (Signed)
Patient has not been in since 9/19.

## 2019-07-30 NOTE — Telephone Encounter (Signed)
Requested medication (s) are due for refill today: Lisinopril yes  Requested medication (s) are on the active medication list: yes  Last refill:  04/12/2019  Future visit scheduled: no  Notes to clinic:  no valid encounter in last 6 months  Requested medication (s) are due for refill today: Furosemide yes  Requested medication (s) are on the active medication list: yes  Last refill:  04/12/2019  Future visit scheduled: no  Notes to clinic:  no valid encounter in last 6 months  Requested Prescriptions  Pending Prescriptions Disp Refills   lisinopril (ZESTRIL) 10 MG tablet [Pharmacy Med Name: LISINOPRIL 10MG  TABLETS] 30 tablet 11    Sig: TAKE 1 TABLET(10 MG) BY MOUTH DAILY      Cardiovascular:  ACE Inhibitors Failed - 07/29/2019  6:58 PM      Failed - Cr in normal range and within 180 days    Creatinine  Date Value Ref Range Status  07/30/2014 1.04 0.60 - 1.30 mg/dL Final   Creatinine, Ser  Date Value Ref Range Status  01/10/2018 0.91 0.57 - 1.00 mg/dL Final          Failed - K in normal range and within 180 days    Potassium  Date Value Ref Range Status  01/10/2018 3.9 3.5 - 5.2 mmol/L Final  07/30/2014 4.0 3.5 - 5.1 mmol/L Final          Failed - Last BP in normal range    BP Readings from Last 1 Encounters:  04/16/18 (!) 146/79          Failed - Valid encounter within last 6 months    Recent Outpatient Visits           1 year ago Urinary tract infection with hematuria, site unspecified   [facility] Birdie Sons, MD   1 year ago Edema, unspecified type   [facility] Birdie Sons, MD   1 year ago Altered mental status, unspecified altered mental status type   [facility] Birdie Sons, MD   1 year ago Otitis of left ear   [facility] Birdie Sons, MD   1 year ago [place], Donald E, MD              Passed - Patient is not  pregnant        furosemide (LASIX) 20 MG tablet [Pharmacy Med Name: FUROSEMIDE 20MG  TABLETS] 30 tablet 4    Sig: TAKE 1 TABLET(20 MG) BY MOUTH DAILY      Cardiovascular:  Diuretics - Loop Failed - 07/29/2019  6:58 PM      Failed - K in normal range and within 360 days    Potassium  Date Value Ref Range Status  01/10/2018 3.9 3.5 - 5.2 mmol/L Final  07/30/2014 4.0 3.5 - 5.1 mmol/L Final          Failed - Ca in normal range and within 360 days    Calcium  Date Value Ref Range Status  01/10/2018 9.4 8.7 - 10.3 mg/dL Final   Calcium, Total  Date Value Ref Range Status  07/30/2014 8.5 8.5 - 10.1 mg/dL Final          Failed - Na in normal range and within 360 days    Sodium  Date Value Ref Range Status  01/10/2018 142 134 - 144 mmol/L Final  07/30/2014 139 136 - 145 mmol/L Final  Failed - Cr in normal range and within 360 days    Creatinine  Date Value Ref Range Status  07/30/2014 1.04 0.60 - 1.30 mg/dL Final   Creatinine, Ser  Date Value Ref Range Status  01/10/2018 0.91 0.57 - 1.00 mg/dL Final          Failed - Last BP in normal range    BP Readings from Last 1 Encounters:  04/16/18 (!) 146/79          Failed - Valid encounter within last 6 months    Recent Outpatient Visits           1 year ago Urinary tract infection with hematuria, site unspecified   [facility] Birdie Sons, MD   1 year ago Edema, unspecified type   [facility] Birdie Sons, MD   1 year ago Altered mental status, unspecified altered mental status type   [facility] Birdie Sons, MD   1 year ago Otitis of left ear   [facility] Birdie Sons, MD   1 year ago [place], Donald E, MD

## 2019-07-30 NOTE — Telephone Encounter (Signed)
Patient has not been seen since 9/19, last lipids 6/19 and the last time this medication was ordered was 6/19

## 2019-07-30 NOTE — Telephone Encounter (Signed)
Requested medication (s) are due for refill today: no  Requested medication (s) are on the active medication list: yes  Last refill:  07/29/2019  Future visit scheduled: no  Notes to clinic:  no valid encounter within last 12 months   Requested Prescriptions  Pending Prescriptions Disp Refills   simvastatin (ZOCOR) 20 MG tablet [Pharmacy Med Name: SIMVASTATIN 20MG  TABLETS] 90 tablet 3    Sig: TAKE 1 TABLET(20 MG) BY MOUTH AT BEDTIME      Cardiovascular:  Antilipid - Statins Failed - 07/30/2019  9:17 AM      Failed - Total Cholesterol in normal range and within 360 days    Cholesterol, Total  Date Value Ref Range Status  01/10/2018 206 (H) 100 - 199 mg/dL Final          Failed - LDL in normal range and within 360 days    LDL Calculated  Date Value Ref Range Status  01/10/2018 142 (H) 0 - 99 mg/dL Final          Failed - HDL in normal range and within 360 days    HDL  Date Value Ref Range Status  01/10/2018 35 (L) >39 mg/dL Final          Failed - Triglycerides in normal range and within 360 days    Triglycerides  Date Value Ref Range Status  01/10/2018 145 0 - 149 mg/dL Final          Failed - Valid encounter within last 12 months    Recent Outpatient Visits           1 year ago Urinary tract infection with hematuria, site unspecified   [facility] Birdie Sons, MD   1 year ago Edema, unspecified type   [facility] Birdie Sons, MD   1 year ago Altered mental status, unspecified altered mental status type   [facility] Birdie Sons, MD   1 year ago Otitis of left ear   [facility] Birdie Sons, MD   1 year ago [place], MD              Passed - Patient is not pregnant

## 2019-11-04 ENCOUNTER — Other Ambulatory Visit: Payer: Self-pay | Admitting: Family Medicine

## 2019-11-04 NOTE — Telephone Encounter (Signed)
Requested medication (s) are due for refill today: yes  Requested medication (s) are on the active medication list: yes  Last refill:  07/30/19  Future visit scheduled: no  Notes to clinic:  overdue for appt- called pt but mailbox not set up to LM.   Requested Prescriptions  Pending Prescriptions Disp Refills   lisinopril (ZESTRIL) 10 MG tablet [Pharmacy Med Name: LISINOPRIL 10MG  TABLETS] 30 tablet 2    Sig: TAKE 1 TABLET(10 MG) BY MOUTH DAILY      Cardiovascular:  ACE Inhibitors Failed - 11/04/2019  1:32 PM      Failed - Cr in normal range and within 180 days    Creatinine  Date Value Ref Range Status  07/30/2014 1.04 0.60 - 1.30 mg/dL Final   Creatinine, Ser  Date Value Ref Range Status  01/10/2018 0.91 0.57 - 1.00 mg/dL Final          Failed - K in normal range and within 180 days    Potassium  Date Value Ref Range Status  01/10/2018 3.9 3.5 - 5.2 mmol/L Final  07/30/2014 4.0 3.5 - 5.1 mmol/L Final          Failed - Last BP in normal range    BP Readings from Last 1 Encounters:  04/16/18 (!) 146/79          Failed - Valid encounter within last 6 months    Recent Outpatient Visits           1 year ago Urinary tract infection with hematuria, site unspecified   [facility] Birdie Sons, MD   1 year ago Edema, unspecified type   [facility] Birdie Sons, MD   1 year ago Altered mental status, unspecified altered mental status type   [facility] Birdie Sons, MD   2 years ago Otitis of left ear   [facility] Birdie Sons, MD   2 years ago [place], MD              Passed - Patient is not pregnant

## 2019-11-05 ENCOUNTER — Other Ambulatory Visit: Payer: Self-pay | Admitting: Family Medicine

## 2019-11-05 ENCOUNTER — Telehealth: Payer: Self-pay | Admitting: *Deleted

## 2019-11-05 DIAGNOSIS — R609 Edema, unspecified: Secondary | ICD-10-CM

## 2019-11-05 NOTE — Telephone Encounter (Signed)
Requested Prescriptions  Pending Prescriptions Disp Refills  . lisinopril (ZESTRIL) 10 MG tablet [Pharmacy Med Name: LISINOPRIL 10MG  TABLETS] 30 tablet 0    Sig: TAKE 1 TABLET(10 MG) BY MOUTH DAILY     Cardiovascular:  ACE Inhibitors Failed - 11/05/2019 10:35 AM      Failed - Cr in normal range and within 180 days    Creatinine  Date Value Ref Range Status  07/30/2014 1.04 0.60 - 1.30 mg/dL Final   Creatinine, Ser  Date Value Ref Range Status  01/10/2018 0.91 0.57 - 1.00 mg/dL Final         Failed - K in normal range and within 180 days    Potassium  Date Value Ref Range Status  01/10/2018 3.9 3.5 - 5.2 mmol/L Final  07/30/2014 4.0 3.5 - 5.1 mmol/L Final         Failed - Last BP in normal range    BP Readings from Last 1 Encounters:  04/16/18 (!) 146/79         Failed - Valid encounter within last 6 months    Recent Outpatient Visits          1 year ago Urinary tract infection with hematuria, site unspecified   [facility] Birdie Sons, MD   1 year ago Edema, unspecified type   [facility] Birdie Sons, MD   1 year ago Altered mental status, unspecified altered mental status type   [facility] Birdie Sons, MD   2 years ago Otitis of left ear   [facility] Birdie Sons, MD   2 years ago [place], MD             Passed - Patient is not pregnant

## 2019-11-05 NOTE — Telephone Encounter (Signed)
Refill request for Lisinopril; last refill 07/30/2019; no valid encounter within last 6 months; no upcoming visits noted; attempted to contact pt; message states voicemail has not been set up; 30 day courtesy refill granted to cover pt until appt; will route to office for notification.

## 2019-11-05 NOTE — Telephone Encounter (Signed)
Refill request for Furosemide; last refill 07/31/2019; last visit 04/16/18; no upcoming visits noted; attempted to contact pt; message states voicemail has not been set up yet; will route to office for final disposition.

## 2019-11-05 NOTE — Telephone Encounter (Signed)
Requested medication (s) are due for refill today: no  Requested medication (s) are on the active medication list: yes  Last refill:  07/31/2019  Future visit scheduled: no  Notes to clinic: last visit 04/16/2018; attempted to contact pt   Requested Prescriptions  Pending Prescriptions Disp Refills   furosemide (LASIX) 20 MG tablet [Pharmacy Med Name: FUROSEMIDE 20MG  TABLETS] 30 tablet 2    Sig: TAKE 1 TABLET(20 MG) BY MOUTH DAILY      Cardiovascular:  Diuretics - Loop Failed - 11/05/2019  7:15 AM      Failed - K in normal range and within 360 days    Potassium  Date Value Ref Range Status  01/10/2018 3.9 3.5 - 5.2 mmol/L Final  07/30/2014 4.0 3.5 - 5.1 mmol/L Final          Failed - Ca in normal range and within 360 days    Calcium  Date Value Ref Range Status  01/10/2018 9.4 8.7 - 10.3 mg/dL Final   Calcium, Total  Date Value Ref Range Status  07/30/2014 8.5 8.5 - 10.1 mg/dL Final          Failed - Na in normal range and within 360 days    Sodium  Date Value Ref Range Status  01/10/2018 142 134 - 144 mmol/L Final  07/30/2014 139 136 - 145 mmol/L Final          Failed - Cr in normal range and within 360 days    Creatinine  Date Value Ref Range Status  07/30/2014 1.04 0.60 - 1.30 mg/dL Final   Creatinine, Ser  Date Value Ref Range Status  01/10/2018 0.91 0.57 - 1.00 mg/dL Final          Failed - Last BP in normal range    BP Readings from Last 1 Encounters:  04/16/18 (!) 146/79          Failed - Valid encounter within last 6 months    Recent Outpatient Visits           1 year ago Urinary tract infection with hematuria, site unspecified   [facility] Birdie Sons, MD   1 year ago Edema, unspecified type   [facility] Birdie Sons, MD   1 year ago Altered mental status, unspecified altered mental status type   [facility] Birdie Sons, MD   2 years ago Otitis of left ear   [facility] Birdie Sons, MD   2 years ago [place], Donald E, MD

## 2019-11-11 NOTE — Telephone Encounter (Signed)
Tried calling patient. No answer, and voice mail box not set up yet.

## 2019-11-14 NOTE — Telephone Encounter (Signed)
Message has been included with the courtesy Rx for patient to contact office for appointment

## 2019-12-25 ENCOUNTER — Other Ambulatory Visit: Payer: Self-pay | Admitting: Family Medicine

## 2019-12-25 DIAGNOSIS — E7800 Pure hypercholesterolemia, unspecified: Secondary | ICD-10-CM

## 2019-12-25 NOTE — Telephone Encounter (Signed)
Requested medication (s) are due for refill today: Yes  Requested medication (s) are on the active medication list: Yes  Last refill:  11/05/19  Future visit scheduled: No  Notes to clinic:  Attempted to reach pt. Unable to leave message to make an appointment. Voice mailbox has not been set up. Already received courtesy refill.    Requested Prescriptions  Pending Prescriptions Disp Refills   lisinopril (ZESTRIL) 10 MG tablet [Pharmacy Med Name: LISINOPRIL 10MG  TABLETS] 30 tablet 0    Sig: TAKE 1 TABLET(10 MG) BY MOUTH DAILY      Cardiovascular:  ACE Inhibitors Failed - 12/25/2019 12:38 PM      Failed - Cr in normal range and within 180 days    Creatinine  Date Value Ref Range Status  07/30/2014 1.04 0.60 - 1.30 mg/dL Final   Creatinine, Ser  Date Value Ref Range Status  01/10/2018 0.91 0.57 - 1.00 mg/dL Final          Failed - K in normal range and within 180 days    Potassium  Date Value Ref Range Status  01/10/2018 3.9 3.5 - 5.2 mmol/L Final  07/30/2014 4.0 3.5 - 5.1 mmol/L Final          Failed - Last BP in normal range    BP Readings from Last 1 Encounters:  04/16/18 (!) 146/79          Failed - Valid encounter within last 6 months    Recent Outpatient Visits           1 year ago Urinary tract infection with hematuria, site unspecified   [facility] Birdie Sons, MD   1 year ago Edema, unspecified type   [facility] Birdie Sons, MD   1 year ago Altered mental status, unspecified altered mental status type   [facility] Birdie Sons, MD   2 years ago Otitis of left ear   [facility] Birdie Sons, MD   2 years ago [place], Donald E, MD              Passed - Patient is not pregnant        simvastatin (ZOCOR) 20 MG tablet [Pharmacy Med Name: SIMVASTATIN 20MG  TABLETS] 90 tablet 2    Sig: TAKE 1 TABLET(20 MG) BY MOUTH AT BEDTIME      Cardiovascular:  Antilipid - Statins Failed - 12/25/2019 12:38 PM      Failed - Total Cholesterol in normal range and within 360 days    Cholesterol, Total  Date Value Ref Range Status  01/10/2018 206 (H) 100 - 199 mg/dL Final          Failed - LDL in normal range and within 360 days    LDL Calculated  Date Value Ref Range Status  01/10/2018 142 (H) 0 - 99 mg/dL Final          Failed - HDL in normal range and within 360 days    HDL  Date Value Ref Range Status  01/10/2018 35 (L) >39 mg/dL Final          Failed - Triglycerides in normal range and within 360 days    Triglycerides  Date Value Ref Range Status  01/10/2018 145 0 - 149 mg/dL Final          Failed - Valid encounter within last 12 months    Recent Outpatient Visits  1 year ago Urinary tract infection with hematuria, site unspecified   [facility] Birdie Sons, MD   1 year ago Edema, unspecified type   [facility] Birdie Sons, MD   1 year ago Altered mental status, unspecified altered mental status type   [facility] Birdie Sons, MD   2 years ago Otitis of left ear   [facility] Birdie Sons, MD   2 years ago [place], MD              Passed - Patient is not pregnant

## 2019-12-26 ENCOUNTER — Other Ambulatory Visit: Payer: Self-pay | Admitting: Family Medicine

## 2019-12-26 DIAGNOSIS — E7800 Pure hypercholesterolemia, unspecified: Secondary | ICD-10-CM

## 2020-01-03 ENCOUNTER — Ambulatory Visit (INDEPENDENT_AMBULATORY_CARE_PROVIDER_SITE_OTHER): Payer: Medicare PPO | Admitting: Family Medicine

## 2020-01-03 ENCOUNTER — Other Ambulatory Visit: Payer: Self-pay

## 2020-01-03 ENCOUNTER — Other Ambulatory Visit: Payer: Self-pay | Admitting: Family Medicine

## 2020-01-03 ENCOUNTER — Encounter: Payer: Self-pay | Admitting: Family Medicine

## 2020-01-03 VITALS — BP 185/61 | HR 69 | Temp 97.7°F | Ht 63.0 in | Wt 188.0 lb

## 2020-01-03 DIAGNOSIS — R2689 Other abnormalities of gait and mobility: Secondary | ICD-10-CM | POA: Insufficient documentation

## 2020-01-03 DIAGNOSIS — Z7689 Persons encountering health services in other specified circumstances: Secondary | ICD-10-CM

## 2020-01-03 DIAGNOSIS — E7800 Pure hypercholesterolemia, unspecified: Secondary | ICD-10-CM | POA: Diagnosis not present

## 2020-01-03 DIAGNOSIS — I10 Essential (primary) hypertension: Secondary | ICD-10-CM

## 2020-01-03 DIAGNOSIS — F0280 Dementia in other diseases classified elsewhere without behavioral disturbance: Secondary | ICD-10-CM

## 2020-01-03 DIAGNOSIS — J4520 Mild intermittent asthma, uncomplicated: Secondary | ICD-10-CM

## 2020-01-03 DIAGNOSIS — Z8673 Personal history of transient ischemic attack (TIA), and cerebral infarction without residual deficits: Secondary | ICD-10-CM

## 2020-01-03 DIAGNOSIS — Z23 Encounter for immunization: Secondary | ICD-10-CM

## 2020-01-03 DIAGNOSIS — R609 Edema, unspecified: Secondary | ICD-10-CM | POA: Diagnosis not present

## 2020-01-03 DIAGNOSIS — F0150 Vascular dementia without behavioral disturbance: Secondary | ICD-10-CM

## 2020-01-03 DIAGNOSIS — I6990 Unspecified sequelae of unspecified cerebrovascular disease: Secondary | ICD-10-CM

## 2020-01-03 DIAGNOSIS — G309 Alzheimer's disease, unspecified: Secondary | ICD-10-CM

## 2020-01-03 DIAGNOSIS — Z79899 Other long term (current) drug therapy: Secondary | ICD-10-CM

## 2020-01-03 DIAGNOSIS — I679 Cerebrovascular disease, unspecified: Secondary | ICD-10-CM

## 2020-01-03 MED ORDER — ALBUTEROL SULFATE HFA 108 (90 BASE) MCG/ACT IN AERS: 2.0000 | INHALATION_SPRAY | Freq: Four times a day (QID) | RESPIRATORY_TRACT | 1 refills | Status: DC | PRN

## 2020-01-03 MED ORDER — FUROSEMIDE 20 MG PO TABS: ORAL_TABLET | ORAL | 1 refills | Status: DC

## 2020-01-03 MED ORDER — SIMVASTATIN 20 MG PO TABS: 20.0000 mg | ORAL_TABLET | Freq: Every day | ORAL | 1 refills | Status: DC

## 2020-01-03 MED ORDER — LISINOPRIL 10 MG PO TABS: ORAL_TABLET | ORAL | 1 refills | Status: DC

## 2020-01-03 NOTE — Patient Instructions (Addendum)
Restart all medications today.  I have sent in a 90 day refill on all prescriptions that you have been taking.  I have put in a referral to home health physical therapy, through [facility] (the provider that you have used back in 2019).  Try to get exercise a minimum of 30 minutes per day at least 5 days per week as well as adequate water intake all while measuring blood pressure a few times per week.  This exercise can include chair exercises and can build up to this over weeks/months.  Keep a blood pressure log and bring back to clinic at your next visit.  If your readings are consistently over 130/80 to contact our office/send me a MyChart message and we will see you sooner.  Can try DASH and Mediterranean diet options, avoiding processed foods, lowering sodium intake, avoiding pork products, and eating a plant based diet for optimal health.  We will plan to see you back in 2 weeks for blood pressure follow up visit (this can be virtual with your son, [name], since it is difficult to get to the office)  You will receive a survey after today's visit either digitally by e-mail or paper by [employer]. Your experiences and feedback matter to [place].  Please respond so we know how we are doing as we provide care for you.  Call us with any questions/concerns/needs.  It is my goal to be available to you for your health concerns.  Thanks for choosing me to be a partner in your healthcare needs!  Harlin Rain, FNP-C Family Nurse Practitioner [facility] Phone: [phone]

## 2020-01-03 NOTE — Assessment & Plan Note (Signed)
Status unknown.  Recheck labs.  Continue meds without changes today.  Refills provided. Followup after labs.  

## 2020-01-03 NOTE — Assessment & Plan Note (Signed)
Son reports that patient uses albuterol inhaler approx 2x per year with seasonal changes.  Does not have current rx at home.  Plan: 1. Rx sent for albuterol inhaler to take 2 puffs every 4-6 hours as needed for wheezing or shortness of breath.

## 2020-01-03 NOTE — Assessment & Plan Note (Signed)
Uncontrolled hypertension.  BP is not at goal < 130/80.  Son, [name], is present at appointment today and reports patient has been out of medications for a little while.  Complications: Dementia, hyperlipidemia, obesity, stroke  Plan: 1. RESTART lisinopril 10mg  daily 2. Obtain labs today  3. Encouraged heart healthy diet and increasing exercise to 30 minutes most days of the week, going no more than 2 days in a row without exercise. 4. Check BP 1-2 x per day at home, keep log, and bring to clinic at next appointment. 5. Follow up 2 weeks, can be virtual, with son, [name].

## 2020-01-03 NOTE — Telephone Encounter (Signed)
Patient/Pharmacy is requesting changes to Rx written today- sent for PCP review

## 2020-01-03 NOTE — Progress Notes (Signed)
Subjective:    Patient ID: Susan Campos, female    DOB: 03-08-1933, 86 y.o.   MRN: [id number]  Susan Campos is a 86 y.o. female presenting on 01/03/2020 for Establish Care (pt concern about her inablility to walk. Pt seems to think her issues with walking have to due with her history of COVID x 6 mths ago. The pt son is concern that his mother is getting enough exercise )   HPI  Previous PCP was at [facility] with Dr. Lelon Huh.  Records will not be requested, as are available through [facility].  Past medical, family, and surgical history reviewed w/ pt.  Son, [name], is present with Ms. Ordaz today with concerns for difficulty with walking.  Two sons of Ms. Bourke assist with activities of daily living and ambulation.  Is unable to safely ambulate independently.  Had previously utilized in home physical therapy with [facility] and requesting to restart.  Reports that patient has been out of medications for > 1 month.  Does have a blood pressure cuff at home and will begin taking blood pressures daily.   Depression screen [facility] 2/9 01/03/2020 01/30/2018 01/27/2017  Decreased Interest 0 0 0  Down, Depressed, Hopeless 0 0 0  PHQ - 2 Score 0 0 0  Altered sleeping - - 0  Tired, decreased energy - - 0  Change in appetite - - 0  Feeling bad or failure about yourself  - - 0  Trouble concentrating - - 0  Moving slowly or fidgety/restless - - 0  Suicidal thoughts - - 0  PHQ-9 Score - - 0  Difficult doing work/chores - - -    Social History   Tobacco Use  . Smoking status: Former Smoker    Packs/day: 0.50    Years: 4.00    Pack years: 2.00    Types: Cigarettes  . Smokeless tobacco: Never Used  . Tobacco comment: 1979  Substance Use Topics  . Alcohol use: No  . Drug use: No    Review of Systems  Constitutional: Negative.   HENT: Negative.   Eyes: Negative.   Respiratory: Negative.   Cardiovascular: Negative.     Gastrointestinal: Negative.   Endocrine: Negative.   Genitourinary: Negative.   Musculoskeletal: Positive for gait problem. Negative for arthralgias, back pain, joint swelling, myalgias, neck pain and neck stiffness.  Skin: Negative.   Allergic/Immunologic: Negative.   Hematological: Negative.   Psychiatric/Behavioral: Negative.    Per HPI unless specifically indicated above     Objective:    BP (!) 185/61   Pulse 69   Temp 97.7 F (36.5 C) (Temporal)   Ht 5\' 3"  (1.6 m)   Wt 188 lb (85.3 kg)   BMI 33.30 kg/m   Wt Readings from Last 3 Encounters:  01/03/20 188 lb (85.3 kg)  04/16/18 189 lb (85.7 kg)  01/30/18 187 lb 6.4 oz (85 kg)    Physical Exam Vitals reviewed. Exam conducted with a chaperone present [name] (Son)).  Constitutional:      General: She is not in acute distress.    Appearance: Normal appearance. She is well-developed and well-groomed. She is obese. She is not ill-appearing or toxic-appearing.  HENT:     Head: Normocephalic.     Nose:     Comments: Lizbeth Bark is in place, covering mouth and nose  Eyes:     General: Lids are normal. Vision grossly intact.        Right eye: No  discharge.        Left eye: No discharge.     Extraocular Movements: Extraocular movements intact.     Conjunctiva/sclera: Conjunctivae normal.     Pupils: Pupils are equal, round, and reactive to light.  Cardiovascular:     Rate and Rhythm: Normal rate and regular rhythm.     Pulses: Normal pulses.          Dorsalis pedis pulses are 2+ on the right side and 2+ on the left side.     Heart sounds: Normal heart sounds. No murmur. No friction rub. No gallop.   Pulmonary:     Effort: Pulmonary effort is normal. No respiratory distress.     Breath sounds: Normal breath sounds.  Musculoskeletal:     Right lower leg: No edema.     Left lower leg: No edema.     Comments: Normal tone, 4/5 strength BUE & BLE  Feet:     Right foot:     Skin integrity: Skin integrity normal.     Left  foot:     Skin integrity: Skin integrity normal.  Skin:    General: Skin is warm and dry.     Capillary Refill: Capillary refill takes less than 2 seconds.  Neurological:     General: No focal deficit present.     Mental Status: She is alert and oriented to person, place, and time.     Cranial Nerves: No cranial nerve deficit.     Sensory: No sensory deficit.     Motor: Weakness present. No tremor, atrophy or abnormal muscle tone.     Coordination: Coordination abnormal.     Gait: Gait abnormal.     Deep Tendon Reflexes: Reflexes are normal and symmetric.  Psychiatric:        Attention and Perception: Attention and perception normal.        Mood and Affect: Mood and affect normal.        Speech: Speech normal.        Behavior: Behavior is cooperative.        Thought Content: Thought content normal.        Cognition and Memory: Cognition normal. Memory is impaired. She exhibits impaired recent memory.        Judgment: Judgment normal.    Results for orders placed or performed in visit on 04/16/18  CULTURE, URINE COMPREHENSIVE   Specimen: Urine   UR  Result Value Ref Range   Urine Culture, Comprehensive Final report (A)    Organism ID, Bacteria Escherichia coli (A)    Organism ID, Bacteria Comment    ANTIMICROBIAL SUSCEPTIBILITY Comment   POCT urinalysis dipstick  Result Value Ref Range   Color, UA Dark Yellow    Clarity, UA Cloudy    Glucose, UA Negative Negative   Bilirubin, UA Neg    Ketones, UA Trace    Spec Grav, UA 1.015 1.010 - 1.025   Blood, UA Trace NonHemolyzed    pH, UA 6.0 5.0 - 8.0   Protein, UA Positive (A) Negative   Urobilinogen, UA 1.0 0.2 or 1.0 E.U./dL   Nitrite, UA Positive    Leukocytes, UA Moderate (2+) (A) Negative   Appearance Abnormal    Odor Abnormal       Assessment & Plan:   Problem List Items Addressed This Visit      Cardiovascular and Mediastinum   Essential hypertension    Uncontrolled hypertension.  BP is not at goal < 130/80.  Son, [name], is present at appointment today and reports patient has been out of medications for a little while.  Complications: Dementia, hyperlipidemia, obesity, stroke  Plan: 1. RESTART lisinopril 10mg  daily 2. Obtain labs today  3. Encouraged heart healthy diet and increasing exercise to 30 minutes most days of the week, going no more than 2 days in a row without exercise. 4. Check BP 1-2 x per day at home, keep log, and bring to clinic at next appointment. 5. Follow up 2 weeks, can be virtual, with son, [name].       Relevant Medications   furosemide (LASIX) 20 MG tablet   lisinopril (ZESTRIL) 10 MG tablet   simvastatin (ZOCOR) 20 MG tablet   Other Relevant Orders   CBC with Differential   COMPLETE METABOLIC PANEL WITH GFR     Respiratory   Airway hyperreactivity    Son reports that patient uses albuterol inhaler approx 2x per year with seasonal changes.  Does not have current rx at home.  Plan: 1. Rx sent for albuterol inhaler to take 2 puffs every 4-6 hours as needed for wheezing or shortness of breath.       Relevant Medications   albuterol (PROAIR HFA) 108 (90 Base) MCG/ACT inhaler     Other   Hypercholesteremia    Status unknown.  Recheck labs.  Continue meds without changes today.  Refills provided. Followup after labs.       Relevant Medications   furosemide (LASIX) 20 MG tablet   lisinopril (ZESTRIL) 10 MG tablet   simvastatin (ZOCOR) 20 MG tablet   Other Relevant Orders   Lipid Profile   Impaired gait and mobility    History of CVA, had followed with [facility] through 04/12/2018 and was written for home physical therapy, had made some improvements and was discharged from home PT.  Neurology had requested a 3 month follow up visit for 07/2018, no appointment has been completed as of yet.  Has 2 sons that are full time caregivers.  Currently is using wheelchair in clinic due to impaired gait/mobility.  At home, family assists with all ambulation.   Unable to safely ambulate on own.  Had previously utilized in home physical therapy with [facility] and interested in restarting these services.  Plan: 1. Referral to home health for physical therapy placed 2. Encouraged son to contact [facility] to schedule follow up visit 3. Follow up in 4 weeks       Other Visit Diagnoses    Encounter to establish care with new doctor    -  Primary   Need for vaccination against Streptococcus pneumoniae       Relevant Orders   Pneumococcal polysaccharide vaccine 23-valent greater than or equal to 2yo subcutaneous/IM (Completed)   Edema, unspecified type       Relevant Medications   furosemide (LASIX) 20 MG tablet   Long-term use of high-risk medication       Relevant Orders   Thyroid Panel With TSH      Meds ordered this encounter  Medications  . albuterol (PROAIR HFA) 108 (90 Base) MCG/ACT inhaler    Sig: Inhale 2 puffs into the lungs every 6 (six) hours as needed for wheezing or shortness of breath.    Dispense:  18 g    Refill:  1  . furosemide (LASIX) 20 MG tablet    Sig: TAKE 1 TABLET(20 MG) BY MOUTH DAILY    Dispense:  90 tablet  Refill:  1    PATIENT NEEDS TO SCHEDULE OFFICE VISIT FOR FOLLOW UP  . lisinopril (ZESTRIL) 10 MG tablet    Sig: TAKE 1 TABLET(10 MG) BY MOUTH DAILY    Dispense:  90 tablet    Refill:  1    PATIENT NEEDS TO SCHEDULE OFFICE VISIT FOR FOLLOW UP  . simvastatin (ZOCOR) 20 MG tablet    Sig: Take 1 tablet (20 mg total) by mouth daily.    Dispense:  90 tablet    Refill:  1    PATIENT NEEDS TO SCHEDULE OFFICE VISIT FOR FOLLOW UP      Follow up plan: Return in about 2 weeks (around 01/17/2020) for HTN F/U (Can be virtual with son [name]).   Harlin Rain, [facility] Nurse Practitioner [facility] 01/03/2020, 12:32 PM

## 2020-01-03 NOTE — Assessment & Plan Note (Signed)
History of CVA, had followed with [facility] through 04/12/2018 and was written for home physical therapy, had made some improvements and was discharged from home PT.  Neurology had requested a 3 month follow up visit for 07/2018, no appointment has been completed as of yet.  Has 2 sons that are full time caregivers.  Currently is using wheelchair in clinic due to impaired gait/mobility.  At home, family assists with all ambulation.  Unable to safely ambulate on own.  Had previously utilized in home physical therapy with [facility] and interested in restarting these services.  Plan: 1. Referral to home health for physical therapy placed 2. Encouraged son to contact [facility] to schedule follow up visit 3. Follow up in 4 weeks

## 2020-01-04 LAB — COMPLETE METABOLIC PANEL WITH GFR
AG Ratio: 1.7 (calc) (ref 1.0–2.5)
ALT: 36 U/L — ABNORMAL HIGH (ref 6–29)
AST: 28 U/L (ref 10–35)
Albumin: 4.8 g/dL (ref 3.6–5.1)
Alkaline phosphatase (APISO): 60 U/L (ref 37–153)
BUN: 14 mg/dL (ref 7–25)
CO2: 23 mmol/L (ref 20–32)
Calcium: 10.1 mg/dL (ref 8.6–10.4)
Chloride: 107 mmol/L (ref 98–110)
Creat: 0.87 mg/dL (ref 0.60–0.88)
GFR, Est African American: 71 mL/min/{1.73_m2} (ref >=60)
GFR, Est Non African American: 61 mL/min/{1.73_m2} (ref >=60)
Globulin: 2.9 g/dL (calc) (ref 1.9–3.7)
Glucose, Bld: 81 mg/dL (ref 65–99)
Potassium: 4.2 mmol/L (ref 3.5–5.3)
Sodium: 140 mmol/L (ref 135–146)
Total Bilirubin: 0.3 mg/dL (ref 0.2–1.2)
Total Protein: 7.7 g/dL (ref 6.1–8.1)

## 2020-01-04 LAB — CBC WITH DIFFERENTIAL/PLATELET
Absolute Monocytes: 443 cells/uL (ref 200–950)
Basophils Absolute: 33 cells/uL (ref 0–200)
Basophils Relative: 0.4 %
Eosinophils Absolute: 123 cells/uL (ref 15–500)
Eosinophils Relative: 1.5 %
HCT: 41.4 % (ref 35.0–45.0)
Hemoglobin: 13.3 g/dL (ref 11.7–15.5)
Lymphs Abs: 2321 cells/uL (ref 850–3900)
MCH: 28.2 pg (ref 27.0–33.0)
MCHC: 32.1 g/dL (ref 32.0–36.0)
MCV: 87.9 fL (ref 80.0–100.0)
MPV: 9 fL (ref 7.5–12.5)
Monocytes Relative: 5.4 %
Neutro Abs: 5281 cells/uL (ref 1500–7800)
Neutrophils Relative %: 64.4 %
Platelets: 211 10*3/uL (ref 140–400)
RBC: 4.71 10*6/uL (ref 3.80–5.10)
RDW: 13.4 % (ref 11.0–15.0)
Total Lymphocyte: 28.3 %
WBC: 8.2 10*3/uL (ref 3.8–10.8)

## 2020-01-04 LAB — LIPID PANEL
Cholesterol: 221 mg/dL — ABNORMAL HIGH (ref <200)
HDL: 62 mg/dL (ref >=50)
LDL Cholesterol (Calc): 136 mg/dL (calc) — ABNORMAL HIGH
Non-HDL Cholesterol (Calc): 159 mg/dL (calc) — ABNORMAL HIGH (ref <130)
Total CHOL/HDL Ratio: 3.6 (calc) (ref <5.0)
Triglycerides: 124 mg/dL (ref <150)

## 2020-01-04 LAB — THYROID PANEL WITH TSH
Free Thyroxine Index: 2.2 (ref 1.4–3.8)
T3 Uptake: 27 % (ref 22–35)
T4, Total: 8 ug/dL (ref 5.1–11.9)
TSH: 1.84 mIU/L (ref 0.40–4.50)

## 2020-02-04 ENCOUNTER — Telehealth: Payer: Self-pay

## 2020-02-11 ENCOUNTER — Ambulatory Visit (INDEPENDENT_AMBULATORY_CARE_PROVIDER_SITE_OTHER): Payer: Medicare PPO | Admitting: Licensed Clinical Social Worker

## 2020-02-11 DIAGNOSIS — F0150 Vascular dementia without behavioral disturbance: Secondary | ICD-10-CM | POA: Diagnosis not present

## 2020-02-11 DIAGNOSIS — G309 Alzheimer's disease, unspecified: Secondary | ICD-10-CM | POA: Diagnosis not present

## 2020-02-11 DIAGNOSIS — N1831 Chronic kidney disease, stage 3a: Secondary | ICD-10-CM

## 2020-02-11 DIAGNOSIS — I10 Essential (primary) hypertension: Secondary | ICD-10-CM

## 2020-02-11 DIAGNOSIS — F0280 Dementia in other diseases classified elsewhere without behavioral disturbance: Secondary | ICD-10-CM

## 2020-02-11 DIAGNOSIS — M1990 Unspecified osteoarthritis, unspecified site: Secondary | ICD-10-CM

## 2020-02-11 NOTE — Chronic Care Management (AMB) (Signed)
Chronic Care Management    Clinical Social Work General Note  02/11/2020 Name: Susan Campos MRN: [id number] DOB: 1933/03/12  Susan Campos is a 86 y.o. year old female who is a primary care patient of [name], [name], [place]. The CCM was consulted to assist the patient with [name]   Susan Campos was given information about Chronic Care Management services today including:  1. CCM service includes personalized support from designated clinical staff supervised by Susan physician, including individualized plan of care and coordination with other care providers 2. 24/7 contact phone numbers for assistance for urgent and routine care needs. 3. Service will only be billed when office clinical staff spend 20 minutes or more in a month to coordinate care. 4. Only one practitioner may furnish and bill the service in a calendar month. 5. The patient may stop CCM services at any time (effective at the end of the month) by phone call to the office staff. 6. The patient will be responsible for cost sharing (co-pay) of up to 20% of the service fee (after annual deductible is met).  Patient agreed to services and verbal consent obtained.   Review of patient status, including review of consultants reports, relevant laboratory and other test results, and collaboration with appropriate care team members and the patient's provider was performed as part of comprehensive patient evaluation and provision of chronic care management services.    SDOH (Social Determinants of Health) assessments and interventions performed:  Yes    Outpatient Encounter Medications as of 02/11/2020  Medication Sig Note  . acetaminophen (TYLENOL ARTHRITIS PAIN) 650 MG CR tablet Take by mouth every 8 (eight) hours as needed.  09/24/2015: Received from: [facility]  . albuterol (VENTOLIN HFA) 108 (90 Base) MCG/ACT inhaler INHALE 2 PUFFS INTO THE LUNGS EVERY 6 HOURS AS NEEDED FOR WHEEZING OR  SHORTNESS OF BREATH   . Apoaequorin (PREVAGEN PO) Take by mouth.   Marland Kitchen aspirin EC 81 MG tablet Take 81 mg by mouth daily. 9 am   . calcium carbonate (TUMS) 500 MG chewable tablet Chew by mouth as needed.   . cholecalciferol (VITAMIN D3) 25 MCG (1000 UNIT) tablet Take 1,000 Units by mouth daily.   . furosemide (LASIX) 20 MG tablet TAKE 1 TABLET(20 MG) BY MOUTH DAILY   . lisinopril (ZESTRIL) 10 MG tablet TAKE 1 TABLET(10 MG) BY MOUTH DAILY   . Multiple Vitamin (MULTIVITAMIN) capsule Take 1 capsule by mouth daily. 9 AM   . simvastatin (ZOCOR) 20 MG tablet Take 1 tablet (20 mg total) by mouth daily.    No facility-administered encounter medications on file as of 02/11/2020.    Goals Addressed    .  SW- Susan Campos (son) "I would like more community resource education to help my Campos" (pt-stated)        CARE PLAN ENTRY (see longitudinal plan of care for additional care plan information)  Current Barriers:  . Financial constraints related to managing health care expenses . Limited social support . ADL IADL limitations . Social Isolation . Memory Deficits . Inability to perform ADL's independently . Inability to perform IADL's independently  Clinical Social Work Clinical Goal(s):  Marland Kitchen Over the next 120 days, patient/caregiver will work with SW to address concerns related to gaining additional support and community resource connection in order to maintain health and mental health appropriately  . Marland Kitchen Over the next 120 days, patient will demonstrate improved adherence to self care as evidenced by implementing healthy self-care into  Susan daily routine such as: attending all medical appointments, deep breathing exercises, taking time for self-reflection, taking medications as prescribed, drinking water and daily exercise to improve mobility.   Interventions: . Inter-disciplinary care team collaboration (see longitudinal plan of care) . Provided patient's caregiver (son Susan Campos) with information about available  socialization opportunities, Psychiatric nurse and [place].  . Emotional support provided to caregiver throughout entire session . Discussed plans with patient for ongoing care management follow up and provided patient with direct contact information for care management team . Advised patient to contact CCM LCSW if future social work needs arise. . Patient is currently receiving PT 2 to 3 times per week to help improve Susan strength and mobility . Patient's sons have relocated and moved in with their parents. Both sons work from home and are able to provide care to patient. However, patient's spouse fell and broke his hip 4 weeks ago which has added additional strain to providing care to family.  . Assisted patient/caregiver with obtaining information about health plan benefits . Provided education and assistance to client/caregiver regarding Advanced Directives. . Provided education to patient/caregiver regarding level of care options. . Provided education to patient/caregiver about Hospice and/or Palliative Care services and what these services include.  Patient Self Care Activities:  . Attends all scheduled provider appointments . Calls provider office for new concerns or questions . Lacks social connections  Initial goal documentation      Follow Up Plan: SW will follow up with patient by phone over the next 30-45 days      Eula Fried, [employer], MSW, [email or web address] Phone: [phone]

## 2020-03-19 ENCOUNTER — Ambulatory Visit (INDEPENDENT_AMBULATORY_CARE_PROVIDER_SITE_OTHER): Payer: Medicare PPO | Admitting: Licensed Clinical Social Worker

## 2020-03-19 DIAGNOSIS — M1990 Unspecified osteoarthritis, unspecified site: Secondary | ICD-10-CM | POA: Diagnosis not present

## 2020-03-19 DIAGNOSIS — I10 Essential (primary) hypertension: Secondary | ICD-10-CM | POA: Diagnosis not present

## 2020-03-19 DIAGNOSIS — N1831 Chronic kidney disease, stage 3a: Secondary | ICD-10-CM | POA: Diagnosis not present

## 2020-03-19 DIAGNOSIS — E7800 Pure hypercholesterolemia, unspecified: Secondary | ICD-10-CM

## 2020-03-19 NOTE — Chronic Care Management (AMB) (Signed)
Chronic Care Management    Clinical Social Work Follow Up Note  03/19/2020 Name: Susan Campos MRN: [id number] DOB: [birth year]  Susan Campos is a 86 y.o. year old female who is a primary care patient of Susan Campos, Susan Raider, FNP. The CCM team was consulted for assistance with Susan Campos .   Review of patient status, including review of consultants reports, other relevant assessments, and collaboration with appropriate care team members and the patient's provider was performed as part of comprehensive patient evaluation and provision of chronic care management services.    SDOH (Social Determinants of Health) assessments performed: Yes    Outpatient Encounter Medications as of 03/19/2020  Medication Sig Note  . acetaminophen (TYLENOL ARTHRITIS PAIN) 650 MG CR tablet Take by mouth every 8 (eight) hours as needed.  09/24/2015: Received from: [facility]  . albuterol (VENTOLIN HFA) 108 (90 Base) MCG/ACT inhaler INHALE 2 PUFFS INTO THE LUNGS EVERY 6 HOURS AS NEEDED FOR WHEEZING OR SHORTNESS OF BREATH   . Apoaequorin (PREVAGEN PO) Take by mouth.   Marland Kitchen aspirin EC 81 MG tablet Take 81 mg by mouth daily. 9 am   . calcium carbonate (TUMS) 500 MG chewable tablet Chew by mouth as needed.   . cholecalciferol (VITAMIN D3) 25 MCG (1000 UNIT) tablet Take 1,000 Units by mouth daily.   . furosemide (LASIX) 20 MG tablet TAKE 1 TABLET(20 MG) BY MOUTH DAILY   . lisinopril (ZESTRIL) 10 MG tablet TAKE 1 TABLET(10 MG) BY MOUTH DAILY   . Multiple Vitamin (MULTIVITAMIN) capsule Take 1 capsule by mouth daily. 9 AM   . simvastatin (ZOCOR) 20 MG tablet Take 1 tablet (20 mg total) by mouth daily.    No facility-administered encounter medications on file as of 03/19/2020.     Goals Addressed    .  COMPLETED: SW- Susan Campos (son) "I would like more community resource education to help my mom" (pt-stated)        CARE PLAN ENTRY (see longitudinal plan of care for additional care plan  information)  Current Barriers:  . Financial constraints related to managing health care expenses . Limited social support . ADL IADL limitations . Social Isolation . Memory Deficits . Inability to perform ADL's independently . Inability to perform IADL's independently  Clinical Social Work Clinical Goal(s):  Marland Kitchen Over the next 120 days, patient/caregiver will work with SW to address concerns related to gaining additional support and community resource connection in order to maintain health and mental health appropriately  . Marland Kitchen Over the next 120 days, patient will demonstrate improved adherence to self care as evidenced by implementing healthy self-care into her daily routine such as: attending all medical appointments, deep breathing exercises, taking time for self-reflection, taking medications as prescribed, drinking water and daily exercise to improve mobility.   Interventions: . Inter-disciplinary care team collaboration (see longitudinal plan of care) . Provided patient's caregiver (son Susan Campos) with information about available socialization opportunities, Psychiatric nurse and [place]. Secure email with resource brochures and education were emailed to caregiver on 03/20/19. Marland Kitchen Emotional support provided to caregiver throughout entire session . Discussed plans with patient for ongoing care management follow up and provided patient with direct contact information for care management team . Advised patient to contact CCM LCSW if future social work needs arise. . Patient is currently receiving PT 2 to 3 times per week to help improve her strength and mobility. Family report seeing improvement in patient since starting [facility].  Marland Kitchen Patient's  sons have relocated and moved in with their parents. Both sons work from home and are able to provide care to patient. However, patient's spouse fell and broke his hip 4 weeks ago which has added additional strain to providing care to family.  . Assisted  patient/caregiver with obtaining information about health plan benefits . Provided education and assistance to client/caregiver regarding Advanced Directives. . Provided education to patient/caregiver regarding level of care options. . Provided education to patient/caregiver about Hospice and/or Palliative Care services and what these services include.  Patient Self Care Activities:  . Attends all scheduled provider appointments . Calls provider office for new concerns or questions . Lacks social connections  Please see past updates related to this goal by clicking on the "Past Updates" button in the selected goal      Susan Campos, [place], MSW, [email or web address] Phone: [phone]

## 2020-09-07 ENCOUNTER — Ambulatory Visit: Payer: Self-pay | Admitting: *Deleted

## 2020-09-07 NOTE — Telephone Encounter (Signed)
Please get patient scheduled.  °

## 2020-09-07 NOTE — Telephone Encounter (Signed)
Pt's son, [name] called in.   His mother is having difficulty urinating.  She is having burning and "not making much urine" for the last 2 days. Her ankles are also swollen twice their normal size.   She did not take her water pills last week.  Chase's brother gives [name] her pills and [name] found her last week's supply of water pills where she had not taken them.  Chase restarted them and has been keeping her feet elevated but it hasn't helped her ankles.   Denies chest pain or shortness of breath.  The whole family had the flu 2 wks ago just FYI. I let Chase know there are no providers in the office today.   Someone will call him back.   He can be reached [phone].  I sent my notes to [facility].  Reason for Disposition . Age > 50 years  Answer Assessment - Initial Assessment Questions 1. SEVERITY: "How bad is the pain?"  (e.g., Scale 1-10; mild, moderate, or severe)   - MILD (1-3): complains slightly about urination hurting   - MODERATE (4-7): interferes with normal activities     - SEVERE (8-10): excruciating, unwilling or unable to urinate because of the pain      Susan Campos son calling in.  Mom with him. 2 wks ago we all had the flu.   No positive covid tests.   The last 2 days trouble urinating with very little volume. Several of her water pills were not taken last week.   I've restarted her water pills.   She's not active for the last 18 months.   She has her mental state of mind Her feet are swollen she has this with inactivety.   I raise her feet and given extra water pill but this time it's not helping.   My brother gives her her pills.   She doesn't want to get up to go to the bathroom.   She is prone to UTIs.  Her O2 98%.  Normal temp.   2. FREQUENCY: "How many times have you had painful urination today?"      Yes   Barely making urine.    Yesterday started on water but due to swollen ankles I cut her back.  Her ankles are twice their normal size. 3.  PATTERN: "Is pain present every time you urinate or just sometimes?"      Every time 4. ONSET: "When did the painful urination start?"      Last 2 days.    5. FEVER: "Do you have a fever?" If Yes, ask: "What is your temperature, how was it measured, and when did it start?"     No fever 6. PAST UTI: "Have you had a urine infection before?" If Yes, ask: "When was the last time?" and "What happened that time?"      Yes 7. CAUSE: "What do you think is causing the painful urination?"  (e.g., UTI, scratch, Herpes sore)     UTI 8. OTHER SYMPTOMS: "Do you have any other symptoms?" (e.g., flank pain, vaginal discharge, genital sores, urgency, blood in urine)     Denies chest pain.  Alert and with it.  No shortness of breath.   9. PREGNANCY: "Is there any chance you are pregnant?" "When was your last menstrual period?"     N/A  Protocols used: [place]

## 2020-09-07 NOTE — Telephone Encounter (Signed)
Needs appt here or there tomorrow

## 2020-09-08 ENCOUNTER — Other Ambulatory Visit: Payer: Self-pay

## 2020-09-08 ENCOUNTER — Encounter: Payer: Self-pay | Admitting: Family Medicine

## 2020-09-08 ENCOUNTER — Ambulatory Visit (INDEPENDENT_AMBULATORY_CARE_PROVIDER_SITE_OTHER): Payer: Medicare PPO | Admitting: Family Medicine

## 2020-09-08 VITALS — BP 144/72 | HR 71 | Temp 97.7°F | Ht 63.0 in | Wt 171.0 lb

## 2020-09-08 DIAGNOSIS — I10 Essential (primary) hypertension: Secondary | ICD-10-CM | POA: Diagnosis not present

## 2020-09-08 DIAGNOSIS — R609 Edema, unspecified: Secondary | ICD-10-CM

## 2020-09-08 DIAGNOSIS — Z23 Encounter for immunization: Secondary | ICD-10-CM | POA: Diagnosis not present

## 2020-09-08 DIAGNOSIS — N1831 Chronic kidney disease, stage 3a: Secondary | ICD-10-CM

## 2020-09-08 DIAGNOSIS — R300 Dysuria: Secondary | ICD-10-CM | POA: Insufficient documentation

## 2020-09-08 DIAGNOSIS — J4520 Mild intermittent asthma, uncomplicated: Secondary | ICD-10-CM | POA: Diagnosis not present

## 2020-09-08 DIAGNOSIS — E7800 Pure hypercholesterolemia, unspecified: Secondary | ICD-10-CM | POA: Diagnosis not present

## 2020-09-08 MED ORDER — LISINOPRIL 10 MG PO TABS: ORAL_TABLET | ORAL | 1 refills | Status: DC

## 2020-09-08 MED ORDER — SIMVASTATIN 20 MG PO TABS: 20.0000 mg | ORAL_TABLET | Freq: Every day | ORAL | 1 refills | Status: DC

## 2020-09-08 MED ORDER — FUROSEMIDE 20 MG PO TABS: ORAL_TABLET | ORAL | 1 refills | Status: DC

## 2020-09-08 MED ORDER — ALBUTEROL SULFATE HFA 108 (90 BASE) MCG/ACT IN AERS: INHALATION_SPRAY | RESPIRATORY_TRACT | 1 refills | Status: DC

## 2020-09-08 NOTE — Progress Notes (Signed)
   SUBJECTIVE:   CHIEF COMPLAINT / HPI:   Patient Active Problem List   Diagnosis Date Noted  . Dysuria 09/08/2020  . Impaired gait and mobility 01/03/2020  . Mixed Alzheimer's and vascular dementia ([place]) 05/25/2016  . Obesity (BMI 30.0-34.9) 05/25/2016  . Lacunar stroke ([place]) 05/25/2016  . Late effects of CVA (cerebrovascular accident) 05/05/2016  . Ataxia 04/20/2016  . Osteopenia 01/09/2016  . Arthritis 09/24/2015  . Airway hyperreactivity 09/24/2015  . Essential hypertension 09/24/2015  . Chronic kidney disease (CKD), stage III (moderate) ([place]) 09/24/2015  . Colitis 09/24/2015  . Esophagitis, reflux 09/24/2015  . History of cardioembolic cerebrovascular accident (CVA) 09/24/2015  . Decreased potassium in the blood 09/24/2015  . History of malignant melanoma of skin 09/24/2015  . Obesity 09/24/2015  . Cerebrovascular disease 09/24/2015  . Hypercholesteremia 08/17/2015  . Knee joint replaced by other means 01/18/2015   URINARY SYMPTOMS - dysuria and decreased UOP x4 days with LE edema. Did not take water pills last week.   Dysuria: yes Urinary frequency: no Urgency: no Small volume voids: yes Urinary incontinence: yes, not new Foul odor: no Hematuria: no Abdominal pain: no Back pain: yes Suprapubic pain/pressure: no Flank pain: no Fever:  no Vomiting: no Relief with cranberry juice: no Relief with pyridium: no Status: better the last day since drinking more Previous urinary tract infection: yes Recurrent urinary tract infection: no Sexual activity: No sexually active Vaginal discharge: no Treatments attempted: increasing fluids   Hypertension: - Medications: lisinopril 10mg , lasix 20mg   - Compliance: taking 2 pills of lasix per day for edema.  - Checking BP at home: yes, SBP 90-135 - Denies any SOB, CP, vision changes, medication SEs, or symptoms of hypotension   OBJECTIVE:   BP (!) 144/72   Pulse 71   Temp 97.7 F (36.5 C) (Oral)   Ht 5\' 3"  (1.6 m)    Wt 171 lb (77.6 kg)   SpO2 96%   BMI 30.29 kg/m   Gen: elderly, in NAD Card: RRR, no murmur Lungs: CTAB Abd: soft, NTND, +BS. No CVA tenderness.  Bedside US with small amount of urine in bladder. Bladder wall with normal thickness.  ASSESSMENT/PLAN:   Dysuria Unable to provide urine sample today, will send home with collection cup and check UA and culture once collected and treat as indicated. Will check BMP given increase in lasix this past week. Emergency precautions reviewed.  Hypercholesteremia Tolerating statin, refill provided.  Chronic kidney disease (CKD), stage III (moderate) Recheck labs today.  Essential hypertension At goal for age, no changes made today. Will recheck labs today. F/u with PCP in 3 months.     Myles Gip, DO

## 2020-09-08 NOTE — Assessment & Plan Note (Signed)
Recheck labs today. 

## 2020-09-08 NOTE — Assessment & Plan Note (Signed)
Unable to provide urine sample today, will send home with collection cup and check UA and culture once collected and treat as indicated. Will check BMP given increase in lasix this past week. Emergency precautions reviewed.

## 2020-09-08 NOTE — Assessment & Plan Note (Signed)
Tolerating statin, refill provided.

## 2020-09-08 NOTE — Patient Instructions (Signed)
It was great to see you!  Our plans for today: - We sent refills for your medications to the pharmacy. - Collect your urine in the cup provided and bring back to the clinic after collection.  - Follow up with your regular doctor in 3 months.  We are checking some labs today, we will release these results to your MyChart.  Take care and seek immediate care sooner if you develop any concerns.   Dr. Ky Barban

## 2020-09-08 NOTE — Telephone Encounter (Signed)
Gari Crown, pt's son, scheduled her this afternoon with Dr Ky Barban

## 2020-09-08 NOTE — Assessment & Plan Note (Signed)
At goal for age, no changes made today. Will recheck labs today. F/u with PCP in 3 months.

## 2020-09-09 LAB — BASIC METABOLIC PANEL
BUN/Creatinine Ratio: 16 (ref 12–28)
BUN: 16 mg/dL (ref 8–27)
CO2: 25 mmol/L (ref 20–29)
Calcium: 9.4 mg/dL (ref 8.7–10.3)
Chloride: 107 mmol/L — ABNORMAL HIGH (ref 96–106)
Creatinine, Ser: 0.98 mg/dL (ref 0.57–1.00)
GFR calc Af Amer: 61 mL/min/{1.73_m2} (ref >59)
GFR calc non Af Amer: 53 mL/min/{1.73_m2} — ABNORMAL LOW (ref >59)
Glucose: 83 mg/dL (ref 65–99)
Potassium: 3.8 mmol/L (ref 3.5–5.2)
Sodium: 145 mmol/L — ABNORMAL HIGH (ref 134–144)

## 2020-09-15 ENCOUNTER — Other Ambulatory Visit: Payer: Medicare PPO

## 2020-09-15 DIAGNOSIS — R8281 Pyuria: Secondary | ICD-10-CM

## 2020-09-15 LAB — URINALYSIS, ROUTINE W REFLEX MICROSCOPIC
Bilirubin, UA: NEGATIVE
Glucose, UA: NEGATIVE
Ketones, UA: NEGATIVE
Nitrite, UA: POSITIVE — AB
Protein,UA: NEGATIVE
RBC, UA: NEGATIVE
Specific Gravity, UA: 1.02 (ref 1.005–1.030)
Urobilinogen, Ur: 1 mg/dL (ref 0.2–1.0)
pH, UA: 7 (ref 5.0–7.5)

## 2020-09-15 LAB — MICROSCOPIC EXAMINATION: WBC, UA: >30 /hpf — AB (ref 0–5)

## 2020-09-15 NOTE — Addendum Note (Signed)
Addended byRudie Meyer on: 09/15/2020 02:36 PM   Modules accepted: Orders

## 2020-09-17 LAB — URINE CULTURE

## 2021-02-23 ENCOUNTER — Other Ambulatory Visit: Payer: Self-pay | Admitting: Family Medicine

## 2021-02-23 DIAGNOSIS — J4520 Mild intermittent asthma, uncomplicated: Secondary | ICD-10-CM

## 2021-02-23 NOTE — Telephone Encounter (Signed)
Requested medications are due for refill today.  yes  Requested medications are on the active medications list.  yes  Last refill. 09/08/2020  Future visit scheduled.   no  Notes to clinic.  Pt is listed as a pt of [facility].

## 2021-02-24 NOTE — Telephone Encounter (Signed)
It looks like she is a patient at [facility]

## 2021-03-08 ENCOUNTER — Other Ambulatory Visit: Payer: Self-pay | Admitting: Family Medicine

## 2021-03-08 DIAGNOSIS — R609 Edema, unspecified: Secondary | ICD-10-CM

## 2021-03-08 DIAGNOSIS — I10 Essential (primary) hypertension: Secondary | ICD-10-CM

## 2021-03-08 DIAGNOSIS — E7800 Pure hypercholesterolemia, unspecified: Secondary | ICD-10-CM

## 2021-03-09 NOTE — Telephone Encounter (Signed)
Requested medications are due for refill today yes  Requested medications are on the active medication list yes  Last refill 4/27  Last visit 09/2020  Future visit scheduled no  Notes to clinic Has Malfi, NP listed as PCP, no upcoming appt scheduled with a provider, please assess.

## 2021-04-04 ENCOUNTER — Telehealth: Payer: Self-pay | Admitting: *Deleted

## 2021-04-04 DIAGNOSIS — Z0000 Encounter for general adult medical examination without abnormal findings: Secondary | ICD-10-CM

## 2021-04-04 NOTE — Patient Instructions (Signed)
Health Maintenance, Female Adopting a healthy lifestyle and getting preventive care are important in promoting health and wellness. Ask your health care provider about: The right schedule for you to have regular tests and exams. Things you can do on your own to prevent diseases and keep yourself healthy. What should I know about diet, weight, and exercise? Eat a healthy diet  Eat a diet that includes plenty of vegetables, fruits, low-fat dairy products, and lean protein. Do not eat a lot of foods that are high in solid fats, added sugars, or sodium. Maintain a healthy weight Body mass index (BMI) is used to identify weight problems. It estimates body fat based on height and weight. Your health care provider can help determine your BMI and help you achieve or maintain a healthy weight. Get regular exercise Get regular exercise. This is one of the most important things you can do for your health. Most adults should: Exercise for at least 150 minutes each week. The exercise should increase your heart rate and make you sweat (moderate-intensity exercise). Do strengthening exercises at least twice a week. This is in addition to the moderate-intensity exercise. Spend less time sitting. Even light physical activity can be beneficial. Watch cholesterol and blood lipids Have your blood tested for lipids and cholesterol at 85 years of age, then have this test every 5 years. Have your cholesterol levels checked more often if: Your lipid or cholesterol levels are high. You are older than 85 years of age. You are at high risk for heart disease. What should I know about cancer screening? Depending on your health history and family history, you may need to have cancer screening at various ages. This may include screening for: Breast cancer. Cervical cancer. Colorectal cancer. Skin cancer. Lung cancer. What should I know about heart disease, diabetes, and high blood pressure? Blood pressure and heart  disease High blood pressure causes heart disease and increases the risk of stroke. This is more likely to develop in people who have high blood pressure readings, are of African descent, or are overweight. Have your blood pressure checked: Every 3-5 years if you are 18-39 years of age. Every year if you are 40 years old or older. Diabetes Have regular diabetes screenings. This checks your fasting blood sugar level. Have the screening done: Once every three years after age 40 if you are at a normal weight and have a low risk for diabetes. More often and at a younger age if you are overweight or have a high risk for diabetes. What should I know about preventing infection? Hepatitis B If you have a higher risk for hepatitis B, you should be screened for this virus. Talk with your health care provider to find out if you are at risk for hepatitis B infection. Hepatitis C Testing is recommended for: Everyone born from 1945 through 1965. Anyone with known risk factors for hepatitis C. Sexually transmitted infections (STIs) Get screened for STIs, including gonorrhea and chlamydia, if: You are sexually active and are younger than 85 years of age. You are older than 85 years of age and your health care provider tells you that you are at risk for this type of infection. Your sexual activity has changed since you were last screened, and you are at increased risk for chlamydia or gonorrhea. Ask your health care provider if you are at risk. Ask your health care provider about whether you are at high risk for HIV. Your health care provider may recommend a prescription medicine   to help prevent HIV infection. If you choose to take medicine to prevent HIV, you should first get tested for HIV. You should then be tested every 3 months for as long as you are taking the medicine. Pregnancy If you are about to stop having your period (premenopausal) and you may become pregnant, seek counseling before you get  pregnant. Take 400 to 800 micrograms (mcg) of folic acid every day if you become pregnant. Ask for birth control (contraception) if you want to prevent pregnancy. Osteoporosis and menopause Osteoporosis is a disease in which the bones lose minerals and strength with aging. This can result in bone fractures. If you are 65 years old or older, or if you are at risk for osteoporosis and fractures, ask your health care provider if you should: Be screened for bone loss. Take a calcium or vitamin D supplement to lower your risk of fractures. Be given hormone replacement therapy (HRT) to treat symptoms of menopause. Follow these instructions at home: Lifestyle Do not use any products that contain nicotine or tobacco, such as cigarettes, e-cigarettes, and chewing tobacco. If you need help quitting, ask your health care provider. Do not use street drugs. Do not share needles. Ask your health care provider for help if you need support or information about quitting drugs. Alcohol use Do not drink alcohol if: Your health care provider tells you not to drink. You are pregnant, may be pregnant, or are planning to become pregnant. If you drink alcohol: Limit how much you use to 0-1 drink a day. Limit intake if you are breastfeeding. Be aware of how much alcohol is in your drink. In the [place] one drink equals one 12 oz bottle of beer (355 mL), one 5 oz glass of wine (148 mL), or one 1 oz glass of hard liquor (44 mL). General instructions Schedule regular health, dental, and eye exams. Stay current with your vaccines. Tell your health care provider if: You often feel depressed. You have ever been abused or do not feel safe at home. Summary Adopting a healthy lifestyle and getting preventive care are important in promoting health and wellness. Follow your health care provider's instructions about healthy diet, exercising, and getting tested or screened for diseases. Follow your health care provider's  instructions on monitoring your cholesterol and blood pressure. This information is not intended to replace advice given to you by your health care provider. Make sure you discuss any questions you have with your health care provider. Document Revised: 09/25/2020 Document Reviewed: 07/11/2018 Elsevier Patient Education  2022 Elsevier Inc.  

## 2021-04-04 NOTE — Telephone Encounter (Signed)
error 

## 2021-04-28 ENCOUNTER — Telehealth: Payer: Medicare PPO

## 2021-04-28 NOTE — Telephone Encounter (Signed)
Copied from [place] [id number]. Topic: Appointment Scheduling - Scheduling Inquiry for Clinic >> Apr 27, 2021  4:50 PM Greggory Keen D wrote: Reason for CRM: Pt's son called asking to schedule an appt.  This patient has been the [facility], then changed to [facility] at [facility] then seen Ebony Hail at [place].  He said she was about out of all her medications and he would like for her to see some one at [facility].  CB#  [phone]   Please call and schedule appointment as requested.

## 2021-04-29 ENCOUNTER — Other Ambulatory Visit: Payer: Self-pay

## 2021-04-29 ENCOUNTER — Ambulatory Visit: Payer: Medicare PPO | Admitting: Internal Medicine

## 2021-04-29 ENCOUNTER — Encounter: Payer: Self-pay | Admitting: Internal Medicine

## 2021-04-29 VITALS — BP 168/75 | HR 80 | Temp 97.3°F | Resp 17 | Ht 63.0 in | Wt 172.0 lb

## 2021-04-29 DIAGNOSIS — E7800 Pure hypercholesterolemia, unspecified: Secondary | ICD-10-CM

## 2021-04-29 DIAGNOSIS — R609 Edema, unspecified: Secondary | ICD-10-CM

## 2021-04-29 DIAGNOSIS — M8589 Other specified disorders of bone density and structure, multiple sites: Secondary | ICD-10-CM

## 2021-04-29 DIAGNOSIS — N1831 Chronic kidney disease, stage 3a: Secondary | ICD-10-CM

## 2021-04-29 DIAGNOSIS — F0150 Vascular dementia without behavioral disturbance: Secondary | ICD-10-CM

## 2021-04-29 DIAGNOSIS — K2100 Gastro-esophageal reflux disease with esophagitis, without bleeding: Secondary | ICD-10-CM

## 2021-04-29 DIAGNOSIS — Z23 Encounter for immunization: Secondary | ICD-10-CM | POA: Diagnosis not present

## 2021-04-29 DIAGNOSIS — F0280 Dementia in other diseases classified elsewhere without behavioral disturbance: Secondary | ICD-10-CM

## 2021-04-29 DIAGNOSIS — G309 Alzheimer's disease, unspecified: Secondary | ICD-10-CM

## 2021-04-29 DIAGNOSIS — M1990 Unspecified osteoarthritis, unspecified site: Secondary | ICD-10-CM | POA: Diagnosis not present

## 2021-04-29 DIAGNOSIS — I10 Essential (primary) hypertension: Secondary | ICD-10-CM | POA: Diagnosis not present

## 2021-04-29 DIAGNOSIS — Z8673 Personal history of transient ischemic attack (TIA), and cerebral infarction without residual deficits: Secondary | ICD-10-CM

## 2021-04-29 MED ORDER — LISINOPRIL 10 MG PO TABS: ORAL_TABLET | ORAL | 1 refills | Status: DC

## 2021-04-29 MED ORDER — FUROSEMIDE 20 MG PO TABS: ORAL_TABLET | ORAL | 1 refills | Status: DC

## 2021-04-29 MED ORDER — SIMVASTATIN 20 MG PO TABS: 20.0000 mg | ORAL_TABLET | Freq: Every day | ORAL | 1 refills | Status: DC

## 2021-04-29 NOTE — Progress Notes (Signed)
Subjective:    Patient ID: Susan Campos, female    DOB: Mar 25, 1933, 87 y.o.   MRN: [id number]  HPI  Patient presents the clinic today for follow-up of chronic conditions.  She is establishing care with me today, transferring care from [facility], NP.  HTN: Her BP today is 140/91.  She is taking Lisinopril and Furosemide as prescribed.  ECG from 11/2016 reviewed.  HLD with History of Stroke: Her last LDL was 136, triglycerides 124, 12/2019.  She denies myalgias on Simvastatin.  She is taking Aspirin daily.  She tries to consume a low-fat diet.  GERD: She is not sure what triggers this. She is not taking any medication for this.  There is no upper GI on file.  OA: Mainly in her knees.  She is taking Tylenol as needed with good relief of symptoms.  Osteopenia: She is taking Calcium and Vitamin D OTC.  She does not get much weightbearing exercise.  Bone density from 07/2014 reviewed.  CKD 3: Her last creatinine was 0.98, GFR 53, 09/2020.  She is on Lisinopril for renal protection.  She does not follow with nephrology.  Alzheimer's: Mild cognitive and functional needs.  She is not taking any medications for this.  MRI brain from 04/2016 reviewed.  Review of Systems     Past Medical History:  Diagnosis Date   Arthritis    HIPS   Asthma    HX OF   Cancer ([place])    skin   Hypertension    Orthopnea    sleeps in recliner, cannot lay flat on her back   Stroke [facility])    CVA 2012/ MEMORY LOSS, 1999, 1 more in 2011   Vertigo    OCCASIONAL    Current Outpatient Medications  Medication Sig Dispense Refill   acetaminophen (TYLENOL) 650 MG CR tablet Take by mouth every 8 (eight) hours as needed.      albuterol (VENTOLIN HFA) 108 (90 Base) MCG/ACT inhaler INHALE 2 PUFFS INTO THE LUNGS EVERY 6 HOURS AS NEEDED FOR WHEEZING OR SHORTNESS OF BREATH 54 g 1   Apoaequorin (PREVAGEN PO) Take by mouth.     aspirin EC 81 MG tablet Take 81 mg by mouth daily. 9 am     calcium carbonate (TUMS  - DOSED IN MG ELEMENTAL CALCIUM) 500 MG chewable tablet Chew by mouth as needed.     cholecalciferol (VITAMIN D3) 25 MCG (1000 UNIT) tablet Take 1,000 Units by mouth daily.     furosemide (LASIX) 20 MG tablet TAKE 1 TABLET(20 MG) BY MOUTH DAILY 90 tablet 1   lisinopril (ZESTRIL) 10 MG tablet TAKE 1 TABLET(10 MG) BY MOUTH DAILY 90 tablet 1   Multiple Vitamin (MULTIVITAMIN) capsule Take 1 capsule by mouth daily. 9 AM     simvastatin (ZOCOR) 20 MG tablet Take 1 tablet (20 mg total) by mouth daily. 90 tablet 1   No current facility-administered medications for this visit.    Allergies  Allergen Reactions   Lovastatin Shortness Of Breath and Palpitations   Aricept [Donepezil Hcl]     dizziness    Family History  Problem Relation Age of Onset   Heart attack Mother    Heart attack Father    Heart attack Paternal Grandmother    Skin cancer Paternal Grandmother     Social History   Socioeconomic History   Marital status: Married    Spouse name: Not on file   Number of children: 2   Years of education:  Not on file   Highest education level: Associate degree: occupational, Hotel manager, or vocational program  Occupational History   Occupation: Retired  Tobacco Use   Smoking status: Former    Packs/day: 0.50    Years: 4.00    Pack years: 2.00    Types: Cigarettes   Smokeless tobacco: Never   Tobacco comments:    1979  Vaping Use   Vaping Use: Never used  Substance and Sexual Activity   Alcohol use: No   Drug use: No   Sexual activity: Not on file  Other Topics Concern   Not on file  Social History Narrative   Not on file   Social Determinants of Health   Financial Resource Strain: Not on file  Food Insecurity: Not on file  Transportation Needs: Not on file  Physical Activity: Not on file  Stress: Not on file  Social Connections: Not on file  Intimate Partner Violence: Not on file     Constitutional: Denies fever, malaise, fatigue, headache or abrupt weight changes.   HEENT: Denies eye pain, eye redness, ear pain, ringing in the ears, wax buildup, runny nose, nasal congestion, bloody nose, or sore throat. Respiratory: Denies difficulty breathing, shortness of breath, cough or sputum production.   Cardiovascular: Denies chest pain, chest tightness, palpitations or swelling in the hands or feet.  Gastrointestinal: Patient reports intermittent reflux.  Denies abdominal pain, bloating, constipation, diarrhea or blood in the stool.  GU: Denies urgency, frequency, pain with urination, burning sensation, blood in urine, odor or discharge. Musculoskeletal: Patient reports joint pain.  Denies decrease in range of motion, difficulty with gait, muscle pain or joint swelling.  Skin: Denies redness, rashes, lesions or ulcercations.  Neurological: Patient reports difficulty with memory.  Denies dizziness, difficulty with speech or problems with balance and coordination.  Psych: Denies anxiety, depression, SI/HI.  No other specific complaints in a complete review of systems (except as listed in HPI above).  Objective:   Physical Exam  BP (!) 168/75 (BP Location: Right Arm, Patient Position: Sitting, Cuff Size: Large)   Pulse 80   Temp (!) 97.3 F (36.3 C) (Temporal)   Resp 17   Ht 5\' 3"  (1.6 m)   Wt 172 lb (78 kg)   SpO2 99%   BMI 30.47 kg/m   Wt Readings from Last 3 Encounters:  09/08/20 171 lb (77.6 kg)  01/03/20 188 lb (85.3 kg)  04/16/18 189 lb (85.7 kg)    General: Appears their stated age, obese, in NAD. Skin: Extremely dry skin noted. HEENT: Head: normal shape and size;  Cardiovascular: Normal rate and rhythm. S1,S2 noted.  No murmur, rubs or gallops noted. 1+ nonpitting BLE edema. No carotid bruits noted. Pulmonary/Chest: Normal effort and positive vesicular breath sounds. No respiratory distress. No wheezes, rales or ronchi noted.  Abdomen: Soft and nontender. Normal bowel sounds. Musculoskeletal: Gait slow and steady with use of rolling  walker. Neurological: Pleasently confused. Psychiatric: Mood and affect normal.     BMET    Component Value Date/Time   NA 145 (H) 09/08/2020 1620   NA 139 07/30/2014 0633   K 3.8 09/08/2020 1620   K 4.0 07/30/2014 0633   CL 107 (H) 09/08/2020 1620   CL 104 07/30/2014 0633   CO2 25 09/08/2020 1620   CO2 30 07/30/2014 0633   GLUCOSE 83 09/08/2020 1620   GLUCOSE 81 01/03/2020 1130   GLUCOSE 101 (H) 07/30/2014 0633   BUN 16 09/08/2020 1620   BUN 8 07/30/2014 1025  CREATININE 0.98 09/08/2020 1620   CREATININE 0.87 01/03/2020 1130   CALCIUM 9.4 09/08/2020 1620   CALCIUM 8.5 07/30/2014 0633   GFRNONAA 53 (L) 09/08/2020 1620   GFRNONAA 61 01/03/2020 1130   GFRAA 61 09/08/2020 1620   GFRAA 71 01/03/2020 1130    Lipid Panel     Component Value Date/Time   CHOL 221 (H) 01/03/2020 1130   CHOL 206 (H) 01/10/2018 1228   TRIG 124 01/03/2020 1130   HDL 62 01/03/2020 1130   HDL 35 (L) 01/10/2018 1228   CHOLHDL 3.6 01/03/2020 1130   LDLCALC 136 (H) 01/03/2020 1130    CBC    Component Value Date/Time   WBC 8.2 01/03/2020 1130   RBC 4.71 01/03/2020 1130   HGB 13.3 01/03/2020 1130   HGB 13.1 01/10/2018 1228   HCT 41.4 01/03/2020 1130   HCT 39.1 01/10/2018 1228   PLT 211 01/03/2020 1130   PLT 199 01/10/2018 1228   MCV 87.9 01/03/2020 1130   MCV 87 01/10/2018 1228   MCV 89 07/16/2014 0951   MCH 28.2 01/03/2020 1130   MCHC 32.1 01/03/2020 1130   RDW 13.4 01/03/2020 1130   RDW 12.8 01/10/2018 1228   RDW 13.3 07/16/2014 0951   LYMPHSABS 2,321 01/03/2020 1130   LYMPHSABS 1.9 10/20/2015 1152   EOSABS 123 01/03/2020 1130   EOSABS 0.1 10/20/2015 1152   BASOSABS 33 01/03/2020 1130   BASOSABS 0.0 10/20/2015 1152    Hgb A1C No results found for: HGBA1C          Assessment & Plan:    Webb Silversmith, NP This visit occurred during the SARS-CoV-2 public health emergency.  Safety protocols were in place, including screening questions prior to the visit, additional usage  of staff PPE, and extensive cleaning of exam room while observing appropriate contact time as indicated for disinfecting solutions.

## 2021-04-30 ENCOUNTER — Other Ambulatory Visit: Payer: Self-pay

## 2021-04-30 ENCOUNTER — Encounter: Payer: Self-pay | Admitting: Internal Medicine

## 2021-04-30 LAB — COMPREHENSIVE METABOLIC PANEL
AG Ratio: 1.7 (calc) (ref 1.0–2.5)
ALT: 8 U/L (ref 6–29)
AST: 11 U/L (ref 10–35)
Albumin: 4.5 g/dL (ref 3.6–5.1)
Alkaline phosphatase (APISO): 80 U/L (ref 37–153)
BUN: 16 mg/dL (ref 7–25)
CO2: 31 mmol/L (ref 20–32)
Calcium: 9.6 mg/dL (ref 8.6–10.4)
Chloride: 106 mmol/L (ref 98–110)
Creat: 0.85 mg/dL (ref 0.60–0.95)
Globulin: 2.7 g/dL (calc) (ref 1.9–3.7)
Glucose, Bld: 88 mg/dL (ref 65–99)
Potassium: 3.6 mmol/L (ref 3.5–5.3)
Sodium: 146 mmol/L (ref 135–146)
Total Bilirubin: 0.5 mg/dL (ref 0.2–1.2)
Total Protein: 7.2 g/dL (ref 6.1–8.1)

## 2021-04-30 LAB — LIPID PANEL
Cholesterol: 141 mg/dL (ref <200)
HDL: 40 mg/dL — ABNORMAL LOW (ref >=50)
LDL Cholesterol (Calc): 67 mg/dL (calc)
Non-HDL Cholesterol (Calc): 101 mg/dL (calc) (ref <130)
Total CHOL/HDL Ratio: 3.5 (calc) (ref <5.0)
Triglycerides: 244 mg/dL — ABNORMAL HIGH (ref <150)

## 2021-04-30 NOTE — Assessment & Plan Note (Signed)
Continue Tylenol as needed 

## 2021-04-30 NOTE — Assessment & Plan Note (Signed)
C-Met today Continue Lisinopril for renal protection

## 2021-04-30 NOTE — Assessment & Plan Note (Signed)
CMET and lipid profile today Continue Simvastatin Encouraged low fat diet

## 2021-04-30 NOTE — Assessment & Plan Note (Signed)
Continue Calcium and Vitamin D

## 2021-04-30 NOTE — Assessment & Plan Note (Signed)
Mild cognitive and functional needs Continue daily aspirin

## 2021-04-30 NOTE — Assessment & Plan Note (Signed)
Continue Omeprazole ?

## 2021-04-30 NOTE — Assessment & Plan Note (Signed)
Mainly cognitive Continue Simvastatin and ASA

## 2021-04-30 NOTE — Assessment & Plan Note (Signed)
Elevated but manual repeat improved Continue Lisinopril and Furosemide Reinforced DASH diet

## 2021-04-30 NOTE — Patient Instructions (Signed)
Heart-Healthy Eating Plan Heart-healthy meal planning includes: Eating less unhealthy fats. Eating more healthy fats. Making other changes in your diet. Talk with your doctor or a diet specialist (dietitian) to create an eating plan that is right for you. What is my plan? Your doctor may recommend an eating plan that includes: Total fat: ______% or less of total calories a day. Saturated fat: ______% or less of total calories a day. Cholesterol: less than _________mg a day. What are tips for following this plan? Cooking Avoid frying your food. Try to bake, boil, grill, or broil it instead. You can also reduce fat by: Removing the skin from poultry. Removing all visible fats from meats. Steaming vegetables in water or broth. Meal planning  At meals, divide your plate into four equal parts: Fill one-half of your plate with vegetables and green salads. Fill one-fourth of your plate with whole grains. Fill one-fourth of your plate with lean protein foods. Eat 4-5 servings of vegetables per day. A serving of vegetables is: 1 cup of raw or cooked vegetables. 2 cups of raw leafy greens. Eat 4-5 servings of fruit per day. A serving of fruit is: 1 medium whole fruit.  cup of dried fruit.  cup of fresh, frozen, or canned fruit.  cup of 100% fruit juice. Eat more foods that have soluble fiber. These are apples, broccoli, carrots, beans, peas, and barley. Try to get 20-30 g of fiber per day. Eat 4-5 servings of nuts, legumes, and seeds per week: 1 serving of dried beans or legumes equals  cup after being cooked. 1 serving of nuts is  cup. 1 serving of seeds equals 1 tablespoon. General information Eat more home-cooked food. Eat less restaurant, buffet, and fast food. Limit or avoid alcohol. Limit foods that are high in starch and sugar. Avoid fried foods. Lose weight if you are overweight. Keep track of how much salt (sodium) you eat. This is important if you have high blood  pressure. Ask your doctor to tell you more about this. Try to add vegetarian meals each week. Fats Choose healthy fats. These include olive oil and canola oil, flaxseeds, walnuts, almonds, and seeds. Eat more omega-3 fats. These include salmon, mackerel, sardines, tuna, flaxseed oil, and ground flaxseeds. Try to eat fish at least 2 times each week. Check food labels. Avoid foods with trans fats or high amounts of saturated fat. Limit saturated fats. These are often found in animal products, such as meats, butter, and cream. These are also found in plant foods, such as palm oil, palm kernel oil, and coconut oil. Avoid foods with partially hydrogenated oils in them. These have trans fats. Examples are stick margarine, some tub margarines, cookies, crackers, and other baked goods. What foods can I eat? Fruits All fresh, canned (in natural juice), or frozen fruits. Vegetables Fresh or frozen vegetables (raw, steamed, roasted, or grilled). Green salads. Grains Most grains. Choose whole wheat and whole grains most of the time. Rice and pasta, including brown rice and pastas made with whole wheat. Meats and other proteins Lean, well-trimmed beef, veal, pork, and lamb. Chicken and turkey without skin. All fish and shellfish. Wild duck, rabbit, pheasant, and venison. Egg whites or low-cholesterol egg substitutes. Dried beans, peas, lentils, and tofu. Seeds and most nuts. Dairy Low-fat or nonfat cheeses, including ricotta and mozzarella. Skim or 1% milk that is liquid, powdered, or evaporated. Buttermilk that is made with low-fat milk. Nonfat or low-fat yogurt. Fats and oils Non-hydrogenated (trans-free) margarines. Vegetable oils, including   soybean, sesame, sunflower, olive, peanut, safflower, corn, canola, and cottonseed. Salad dressings or mayonnaise made with a vegetable oil. Beverages Mineral water. Coffee and tea. Diet carbonated beverages. Sweets and desserts Sherbet, gelatin, and fruit ice.  Small amounts of dark chocolate. Limit all sweets and desserts. Seasonings and condiments All seasonings and condiments. The items listed above may not be a complete list of foods and drinks you can eat. Contact a dietitian for more options. What foods should I avoid? Fruits Canned fruit in heavy syrup. Fruit in cream or butter sauce. Fried fruit. Limit coconut. Vegetables Vegetables cooked in cheese, cream, or butter sauce. Fried vegetables. Grains Breads that are made with saturated or trans fats, oils, or whole milk. Croissants. Sweet rolls. Donuts. High-fat crackers, such as cheese crackers. Meats and other proteins Fatty meats, such as hot dogs, ribs, sausage, bacon, rib-eye roast or steak. High-fat deli meats, such as salami and bologna. Caviar. Domestic duck and goose. Organ meats, such as liver. Dairy Cream, sour cream, cream cheese, and creamed cottage cheese. Whole-milk cheeses. Whole or 2% milk that is liquid, evaporated, or condensed. Whole buttermilk. Cream sauce or high-fat cheese sauce. Yogurt that is made from whole milk. Fats and oils Meat fat, or shortening. Cocoa butter, hydrogenated oils, palm oil, coconut oil, palm kernel oil. Solid fats and shortenings, including bacon fat, salt pork, lard, and butter. Nondairy cream substitutes. Salad dressings with cheese or sour cream. Beverages Regular sodas and juice drinks with added sugar. Sweets and desserts Frosting. Pudding. Cookies. Cakes. Pies. Milk chocolate or white chocolate. Buttered syrups. Full-fat ice cream or ice cream drinks. The items listed above may not be a complete list of foods and drinks to avoid. Contact a dietitian for more information. Summary Heart-healthy meal planning includes eating less unhealthy fats, eating more healthy fats, and making other changes in your diet. Eat a balanced diet. This includes fruits and vegetables, low-fat or nonfat dairy, lean protein, nuts and legumes, whole grains, and  heart-healthy oils and fats. This information is not intended to replace advice given to you by your health care provider. Make sure you discuss any questions you have with your health care provider. Document Revised: 11/26/2020 Document Reviewed: 11/26/2020 Elsevier Patient Education  2022 Elsevier Inc.  

## 2021-05-04 ENCOUNTER — Telehealth: Payer: Self-pay

## 2021-05-04 NOTE — Telephone Encounter (Signed)
Attempted to contact the patient son again, no answer.

## 2021-12-28 ENCOUNTER — Ambulatory Visit (INDEPENDENT_AMBULATORY_CARE_PROVIDER_SITE_OTHER): Payer: Medicare PPO | Admitting: Family Medicine

## 2021-12-28 ENCOUNTER — Ambulatory Visit: Payer: Self-pay

## 2021-12-28 ENCOUNTER — Encounter: Payer: Self-pay | Admitting: Family Medicine

## 2021-12-28 VITALS — Ht 63.0 in | Wt 172.0 lb

## 2021-12-28 DIAGNOSIS — N3001 Acute cystitis with hematuria: Secondary | ICD-10-CM

## 2021-12-28 DIAGNOSIS — R319 Hematuria, unspecified: Secondary | ICD-10-CM | POA: Diagnosis not present

## 2021-12-28 DIAGNOSIS — N390 Urinary tract infection, site not specified: Secondary | ICD-10-CM | POA: Diagnosis not present

## 2021-12-28 LAB — POCT URINALYSIS DIPSTICK
Bilirubin, UA: NEGATIVE
Glucose, UA: NEGATIVE
Ketones, UA: NEGATIVE
Nitrite, UA: POSITIVE
Protein, UA: POSITIVE — AB
Spec Grav, UA: 1.025 (ref 1.010–1.025)
Urobilinogen, UA: 0.2 E.U./dL
pH, UA: 5 (ref 5.0–8.0)

## 2021-12-28 MED ORDER — CEPHALEXIN 500 MG PO CAPS: 500.0000 mg | ORAL_CAPSULE | Freq: Three times a day (TID) | ORAL | 0 refills | Status: DC

## 2021-12-28 NOTE — Patient Instructions (Addendum)
1. You have a Urinary Tract Infection - this is very common, your symptoms are reassuring and you should get better within 1 week on the antibiotics - Start Keflex '500mg'$  3 times daily for next 7 days, complete entire course, even if feeling better - We sent urine for a culture, we will call you within next few days if we need to change antibiotics - Please drink plenty of fluids, improve hydration over next 1 week  If symptoms worsening, developing nausea / vomiting, worsening back pain, fevers / chills / sweats, then please return for re-evaluation sooner.  If you take AZO OTC - limit this to 2-3 days MAX to avoid affecting kidneys  D-Mannose is a natural supplement that can actually help bind to urinary bacteria and reduce their effectiveness it can help prevent UTI from forming, and may reduce some symptoms. It likely cannot cure an active UTI but it is worth a try and good to prevent them with. Try '500mg'$  twice a day at a full dose if you want, or check package instructions for more info    Please schedule a Follow-up Appointment to: Return if symptoms worsen or fail to improve.  If you have any other questions or concerns, please feel free to call the office or send a message through [facility]. You may also schedule an earlier appointment if necessary.  Additionally, you may be receiving a survey about your experience at our office within a few days to 1 week by e-mail or mail. We value your feedback.  Nobie Putnam, DO [place]

## 2021-12-28 NOTE — Telephone Encounter (Signed)
     Chief Complaint: Urinary pain, burning Symptoms: Above Frequency: Sunday Pertinent Negatives: Patient denies fever Disposition: '[]'$ ED /'[]'$ Urgent Care (no appt availability in office) / '[x]'$ Appointment(In office/virtual)/ '[]'$  [facility]/ '[]'$ Home Care/ '[]'$ Refused Recommended Disposition /'[]'$ [facility]/ '[]'$  Follow-up with PCP Additional Notes: Instructed to come this morning and give a urine sample per Apolonio Schneiders in the practice. Verbalizes understanding.  Reason for Disposition  Urinating more frequently than usual (i.e., frequency)  Answer Assessment - Initial Assessment Questions 1. SYMPTOM: "What's the main symptom you're concerned about?" (e.g., frequency, incontinence)     Pain, burning 2. ONSET: "When did the  symptoms  start?"     Sunday 3. PAIN: "Is there any pain?" If Yes, ask: "How bad is it?" (Scale: 1-10; mild, moderate, severe)     Moderate 4. CAUSE: "What do you think is causing the symptoms?"     Maybe UTI 5. OTHER SYMPTOMS: "Do you have any other symptoms?" (e.g., fever, flank pain, blood in urine, pain with urination)     pAIN 6. PREGNANCY: "Is there any chance you are pregnant?" "When was your last menstrual period?"     No  Protocols used: Urinary Symptoms-A-AH

## 2021-12-28 NOTE — Progress Notes (Signed)
Virtual Visit via Telephone The purpose of this virtual visit is to provide medical care while limiting exposure to the novel coronavirus (COVID19) for both patient and office staff.  Consent was obtained for phone visit:  Yes.   Answered questions that patient had about telehealth interaction:  Yes.   I discussed the limitations, risks, security and privacy concerns of performing an evaluation and management service by telephone. I also discussed with the patient that there may be a patient responsible charge related to this service. The patient expressed understanding and agreed to proceed.  Patient Location: Home Provider Location: [facility] (Office)  Participants in virtual visit: - Patient: Susan Campos. Susan Campos (son). - CMA: Orinda Kenner, CMA - Provider: Dr Parks Ranger  ---------------------------------------------------------------------- Chief Complaint  Patient presents with   Urinary Tract Infection   Patient presents for a same day appointment.  PCP Webb Silversmith, FNP   S: Reviewed CMA documentation. I have called patient and gathered additional HPI as follows:  UTI Reports that symptoms started 2-3 days with dysuria concerning for UTI, reduced urine output. - Tried OTC Cranberry some relief of burning. Last UTI 10 month ago No antibiotic allergies  Denies any fevers, chills, sweats, body ache, cough, shortness of breath, sinus pain or pressure, headache, abdominal pain, diarrhea  Past Medical History:  Diagnosis Date   Arthritis    HIPS   Asthma    HX OF   Cancer ([place])    skin   Hypertension    Orthopnea    sleeps in recliner, cannot lay flat on her back   Stroke [facility])    CVA 2012/ MEMORY LOSS, 1999, 1 more in 2011   Vertigo    OCCASIONAL   Social History   Tobacco Use   Smoking status: Former    Packs/day: 0.50    Years: 4.00    Pack years: 2.00    Types: Cigarettes   Smokeless tobacco: Never   Tobacco comments:     1979  Vaping Use   Vaping Use: Never used  Substance Use Topics   Alcohol use: No   Drug use: No    Current Outpatient Medications:    acetaminophen (TYLENOL) 650 MG CR tablet, Take by mouth every 8 (eight) hours as needed. , Disp: , Rfl:    albuterol (VENTOLIN HFA) 108 (90 Base) MCG/ACT inhaler, INHALE 2 PUFFS INTO THE LUNGS EVERY 6 HOURS AS NEEDED FOR WHEEZING OR SHORTNESS OF BREATH, Disp: 54 g, Rfl: 1   aspirin EC 81 MG tablet, Take 81 mg by mouth daily. 9 am, Disp: , Rfl:    calcium carbonate (TUMS - DOSED IN MG ELEMENTAL CALCIUM) 500 MG chewable tablet, Chew by mouth as needed., Disp: , Rfl:    cephALEXin (KEFLEX) 500 MG capsule, Take 1 capsule (500 mg total) by mouth 3 (three) times daily. For 7 days, Disp: 21 capsule, Rfl: 0   cholecalciferol (VITAMIN D3) 25 MCG (1000 UNIT) tablet, Take 1,000 Units by mouth daily., Disp: , Rfl:    furosemide (LASIX) 20 MG tablet, TAKE 1 TABLET(20 MG) BY MOUTH DAILY, Disp: 90 tablet, Rfl: 1   lisinopril (ZESTRIL) 10 MG tablet, TAKE 1 TABLET(10 MG) BY MOUTH DAILY, Disp: 90 tablet, Rfl: 1   Multiple Vitamin (MULTIVITAMIN) capsule, Take 1 capsule by mouth daily. 9 AM, Disp: , Rfl:    simvastatin (ZOCOR) 20 MG tablet, Take 1 tablet (20 mg total) by mouth daily., Disp: 90 tablet, Rfl: 1  01/03/2020   10:46 AM 01/30/2018    1:18 PM 01/27/2017    9:40 AM  Depression screen PHQ 2/9  Decreased Interest 0 0 0  Down, Depressed, Hopeless 0 0 0  PHQ - 2 Score 0 0 0  Altered sleeping   0  Tired, decreased energy   0  Change in appetite   0  Feeling bad or failure about yourself    0  Trouble concentrating   0  Moving slowly or fidgety/restless   0  Suicidal thoughts   0  PHQ-9 Score   0        View : No data to display.          -------------------------------------------------------------------------- O: No physical exam performed due to remote telephone encounter.  Lab results reviewed.  Recent Results (from the past 2160 hour(s))  POCT  Urinalysis Dipstick     Status: Abnormal   Collection Time: 12/28/21  4:34 PM  Result Value Ref Range   Color, UA Yellow    Clarity, UA Cloudy    Glucose, UA Negative Negative   Bilirubin, UA Negative    Ketones, UA Negative    Spec Grav, UA 1.025 1.010 - 1.025   Blood, UA Trace    pH, UA 5.0 5.0 - 8.0   Protein, UA Positive (A) Negative   Urobilinogen, UA 0.2 0.2 or 1.0 E.U./dL   Nitrite, UA Positive    Leukocytes, UA Trace (A) Negative   Appearance     Odor      -------------------------------------------------------------------------- A&P:  Problem List Items Addressed This Visit   None Visit Diagnoses     Acute cystitis with hematuria    -  Primary   Relevant Medications   cephALEXin (KEFLEX) 500 MG capsule   Other Relevant Orders   POCT Urinalysis Dipstick (Completed)   Urine Culture      Clinically consistent with UTI and confirmed on UA. No recent UTIs or abx courses.  No concern for pyelo today (no systemic symptoms, neg fever, back pain, n/v).  Plan: 1. UA / micro - pos nitrite, pos leuks, pos blood 2. Ordered Urine culture 3. Keflex '500mg'$  TID x 7 days 4. Improve PO hydration 5. RTC if no improvement 1-2 weeks, red flags given to return sooner   Meds ordered this encounter  Medications   cephALEXin (KEFLEX) 500 MG capsule    Sig: Take 1 capsule (500 mg total) by mouth 3 (three) times daily. For 7 days    Dispense:  21 capsule    Refill:  0    Follow-up: PRN  Patient verbalizes understanding with the above medical recommendations including the limitation of remote medical advice.  Specific follow-up and call-back criteria were given for patient to follow-up or seek medical care more urgently if needed.   - Time spent in direct consultation with patient on phone: 7 minutes   Nobie Putnam, [facility] 12/28/2021, 4:33 PM

## 2021-12-29 LAB — URINE CULTURE
MICRO NUMBER:: 13461151
SPECIMEN QUALITY:: ADEQUATE

## 2022-04-27 ENCOUNTER — Other Ambulatory Visit: Payer: Self-pay | Admitting: Internal Medicine

## 2022-04-27 DIAGNOSIS — I10 Essential (primary) hypertension: Secondary | ICD-10-CM

## 2022-04-27 NOTE — Telephone Encounter (Signed)
Requested medication (s) are due for refill today: yes  Requested medication (s) are on the active medication list: yes  Last refill:  04/29/21 #90/1  Future visit scheduled: yes 05/03/22  Notes to clinic:  pt is due for appt and updated labs. Called and scheduled appt for 05/03/22     Requested Prescriptions  Pending Prescriptions Disp Refills   lisinopril (ZESTRIL) 10 MG tablet [Pharmacy Med Name: LISINOPRIL '10MG'$  TABLETS] 90 tablet 1    Sig: TAKE 1 TABLET(10 MG) BY MOUTH DAILY     Cardiovascular:  ACE Inhibitors Failed - 04/27/2022  7:02 AM      Failed - Cr in normal range and within 180 days    Creat  Date Value Ref Range Status  04/29/2021 0.85 0.60 - 0.95 mg/dL Final         Failed - K in normal range and within 180 days    Potassium  Date Value Ref Range Status  04/29/2021 3.6 3.5 - 5.3 mmol/L Final  07/30/2014 4.0 3.5 - 5.1 mmol/L Final         Failed - Last BP in normal range    BP Readings from Last 1 Encounters:  04/29/21 (!) 168/75         Failed - Valid encounter within last 6 months    Recent Outpatient Visits           4 months ago Acute cystitis with hematuria   [facility] Olin Hauser, DO   12 months ago [facility], Coralie Keens, NP   1 year ago Flu vaccine need   [facility] Myles Gip, DO   2 years ago Encounter to establish care with new doctor   [facility], [name], FNP   4 years ago Urinary tract infection with hematuria, site unspecified   [facility] Birdie Sons, MD       Future Appointments             In 6 days Baity, Coralie Keens, NP [facility], [place] - Patient is not pregnant

## 2022-05-03 ENCOUNTER — Encounter: Payer: Self-pay | Admitting: Internal Medicine

## 2022-05-03 ENCOUNTER — Ambulatory Visit: Payer: Medicare PPO | Admitting: Internal Medicine

## 2022-05-03 VITALS — BP 138/86 | HR 80 | Temp 96.9°F

## 2022-05-03 DIAGNOSIS — I10 Essential (primary) hypertension: Secondary | ICD-10-CM

## 2022-05-03 DIAGNOSIS — Z8673 Personal history of transient ischemic attack (TIA), and cerebral infarction without residual deficits: Secondary | ICD-10-CM

## 2022-05-03 DIAGNOSIS — M8589 Other specified disorders of bone density and structure, multiple sites: Secondary | ICD-10-CM

## 2022-05-03 DIAGNOSIS — Z23 Encounter for immunization: Secondary | ICD-10-CM

## 2022-05-03 DIAGNOSIS — E7800 Pure hypercholesterolemia, unspecified: Secondary | ICD-10-CM | POA: Diagnosis not present

## 2022-05-03 DIAGNOSIS — R609 Edema, unspecified: Secondary | ICD-10-CM | POA: Diagnosis not present

## 2022-05-03 DIAGNOSIS — G309 Alzheimer's disease, unspecified: Secondary | ICD-10-CM

## 2022-05-03 DIAGNOSIS — K2100 Gastro-esophageal reflux disease with esophagitis, without bleeding: Secondary | ICD-10-CM

## 2022-05-03 DIAGNOSIS — F0150 Vascular dementia without behavioral disturbance: Secondary | ICD-10-CM

## 2022-05-03 DIAGNOSIS — F0280 Dementia in other diseases classified elsewhere without behavioral disturbance: Secondary | ICD-10-CM

## 2022-05-03 DIAGNOSIS — M1990 Unspecified osteoarthritis, unspecified site: Secondary | ICD-10-CM

## 2022-05-03 MED ORDER — FUROSEMIDE 20 MG PO TABS: ORAL_TABLET | ORAL | 1 refills | Status: DC

## 2022-05-03 MED ORDER — LISINOPRIL 10 MG PO TABS: ORAL_TABLET | ORAL | 1 refills | Status: DC

## 2022-05-03 MED ORDER — SIMVASTATIN 20 MG PO TABS: 20.0000 mg | ORAL_TABLET | Freq: Every day | ORAL | 1 refills | Status: DC

## 2022-05-03 NOTE — Assessment & Plan Note (Signed)
Continue calcium and vitamin D 

## 2022-05-03 NOTE — Assessment & Plan Note (Signed)
C-Met and lipid profile today Encouraged her to consume a low-fat diet Can any simvastatin and aspirin

## 2022-05-03 NOTE — Assessment & Plan Note (Signed)
Currently not an issue off meds We will monitor 

## 2022-05-03 NOTE — Patient Instructions (Signed)

## 2022-05-03 NOTE — Assessment & Plan Note (Signed)
She is not a surgical candidate Continue Tylenol OTC as needed

## 2022-05-03 NOTE — Progress Notes (Signed)
Subjective:    Patient ID: Susan Campos, female    DOB: 09-07-1932, 88 y.o.   MRN: [id number]  HPI  Patient presents to clinic today for follow-up of chronic conditions.  HTN: Her BP today is 138/86.  She is taking Lisinopril and Furosemide as prescribed.  ECG from 11/2016 reviewed.  HLD with History of Stroke: Her last LDL was, 67 triglycerides 244, 04/2021.  She denies myalgias on Simvastatin.  She is taking Aspirin as well.  She tries to consume low-fat diet.  GERD: She is not sure what triggers this.  She is not taking any medications for this.  There is no upper GI on file.  OA: Mainly in her knees.  She takes Tylenol as needed with good relief of symptoms.  She does not follow with orthopedics.  Osteopenia: She is taking Calcium and Vitamin D OTC.  She does not get weightbearing exercise.  Bone density from 07/2014 reviewed.  Alzheimer's: Mild cognitive and stable functional needs.  She is not taking any medications for this.  MRI brain from 04/2016 reviewed.  She does not follow with neurology.  Review of Systems     Past Medical History:  Diagnosis Date   Arthritis    HIPS   Asthma    HX OF   Cancer ([place])    skin   Hypertension    Orthopnea    sleeps in recliner, cannot lay flat on her back   Stroke [facility])    CVA 2012/ MEMORY LOSS, 1999, 1 more in 2011   Vertigo    OCCASIONAL    Current Outpatient Medications  Medication Sig Dispense Refill   acetaminophen (TYLENOL) 650 MG CR tablet Take by mouth every 8 (eight) hours as needed.      albuterol (VENTOLIN HFA) 108 (90 Base) MCG/ACT inhaler INHALE 2 PUFFS INTO THE LUNGS EVERY 6 HOURS AS NEEDED FOR WHEEZING OR SHORTNESS OF BREATH 54 g 1   aspirin EC 81 MG tablet Take 81 mg by mouth daily. 9 am     calcium carbonate (TUMS - DOSED IN MG ELEMENTAL CALCIUM) 500 MG chewable tablet Chew by mouth as needed.     cephALEXin (KEFLEX) 500 MG capsule Take 1 capsule (500 mg total) by mouth 3 (three) times daily. For 7  days 21 capsule 0   cholecalciferol (VITAMIN D3) 25 MCG (1000 UNIT) tablet Take 1,000 Units by mouth daily.     furosemide (LASIX) 20 MG tablet TAKE 1 TABLET(20 MG) BY MOUTH DAILY 90 tablet 1   lisinopril (ZESTRIL) 10 MG tablet TAKE 1 TABLET(10 MG) BY MOUTH DAILY 90 tablet 0   Multiple Vitamin (MULTIVITAMIN) capsule Take 1 capsule by mouth daily. 9 AM     simvastatin (ZOCOR) 20 MG tablet Take 1 tablet (20 mg total) by mouth daily. 90 tablet 1   No current facility-administered medications for this visit.    Allergies  Allergen Reactions   Lovastatin Shortness Of Breath and Palpitations   Aricept [Donepezil Hcl]     dizziness    Family History  Problem Relation Age of Onset   Heart attack Mother    Heart attack Father    Heart attack Paternal Grandmother    Skin cancer Paternal Grandmother     Social History   Socioeconomic History   Marital status: Married    Spouse name: Not on file   Number of children: 2   Years of education: Not on file   Highest education level: Associate degree: occupational,  technical, or vocational program  Occupational History   Occupation: Retired  Tobacco Use   Smoking status: Former    Packs/day: 0.50    Years: 4.00    Total pack years: 2.00    Types: Cigarettes   Smokeless tobacco: Never   Tobacco comments:    1979  Vaping Use   Vaping Use: Never used  Substance and Sexual Activity   Alcohol use: No   Drug use: No   Sexual activity: Not on file  Other Topics Concern   Not on file  Social History Narrative   Not on file   Social Determinants of Health   Financial Resource Strain: Low Risk  (01/30/2018)   Overall Financial Resource Strain (CARDIA)    Difficulty of Paying Living Expenses: Not hard at all  Food Insecurity: No Food Insecurity (01/30/2018)   Hunger Vital Sign    Worried About Running Out of Food in the Last Year: Never true    Ran Out of Food in the Last Year: Never true  Transportation Needs: No Transportation Needs  (01/30/2018)   PRAPARE - Hydrologist (Medical): No    Lack of Transportation (Non-Medical): No  Physical Activity: Not on file  Stress: No Stress Concern Present (01/30/2018)   [place]    Feeling of Stress : Not at all  Social Connections: Not on file  Intimate Partner Violence: Not on file     Constitutional: Denies fever, malaise, fatigue, headache or abrupt weight changes.  HEENT: Denies eye pain, eye redness, ear pain, ringing in the ears, wax buildup, runny nose, nasal congestion, bloody nose, or sore throat. Respiratory: Denies difficulty breathing, shortness of breath, cough or sputum production.   Cardiovascular: Denies chest pain, chest tightness, palpitations or swelling in the hands or feet.  Gastrointestinal: Denies abdominal pain, bloating, constipation, diarrhea or blood in the stool.  GU: Denies urgency, frequency, pain with urination, burning sensation, blood in urine, odor or discharge. Musculoskeletal: Patient reports joint pain in knees.  Denies decrease in range of motion, difficulty with gait, muscle pain or joint swelling.  Skin: Denies redness, rashes, lesions or ulcercations.  Neurological: Patient reports difficulty with memory.  Denies dizziness, difficulty with speech or problems with balance and coordination.  Psych: Denies anxiety, depression, SI/HI.  No other specific complaints in a complete review of systems (except as listed in HPI above).  Objective:   Physical Exam  BP 138/86 (BP Location: Left Arm, Patient Position: Sitting, Cuff Size: Normal)   Pulse 80   Temp (!) 96.9 F (36.1 C) (Temporal)   SpO2 99%   Wt Readings from Last 3 Encounters:  12/28/21 172 lb (78 kg)  04/29/21 172 lb (78 kg)  09/08/20 171 lb (77.6 kg)    General: Appears her stated age, chronically ill appearing in NAD. Skin: Warm, dry and intact.  HEENT: Head: normal shape and  size; Eyes: sclera white, no icterus, conjunctiva pink, PERRLA and EOMs intact;  Cardiovascular: Normal rate and rhythm. S1,S2 noted.  No murmur, rubs or gallops noted. 1+ nonpitting BLE edema. No carotid bruits noted. Pulmonary/Chest: Normal effort and positive vesicular breath sounds. No respiratory distress. No wheezes, rales or ronchi noted.  Abdomen: Normal bowel sounds.  Musculoskeletal: Gait slow but steady with use of rolling walker. Neurological: Alert. Difficulty with recall. Repeats herself frequently. Psychiatric: Mood and affect normal. Behavior is normal. Judgment and thought content normal.    BMET  Component Value Date/Time   NA 146 04/29/2021 1527   NA 145 (H) 09/08/2020 1620   NA 139 07/30/2014 0633   K 3.6 04/29/2021 1527   K 4.0 07/30/2014 0633   CL 106 04/29/2021 1527   CL 104 07/30/2014 0633   CO2 31 04/29/2021 1527   CO2 30 07/30/2014 0633   GLUCOSE 88 04/29/2021 1527   GLUCOSE 101 (H) 07/30/2014 0633   BUN 16 04/29/2021 1527   BUN 16 09/08/2020 1620   BUN 8 07/30/2014 0633   CREATININE 0.85 04/29/2021 1527   CALCIUM 9.6 04/29/2021 1527   CALCIUM 8.5 07/30/2014 0633   GFRNONAA 53 (L) 09/08/2020 1620   GFRNONAA 61 01/03/2020 1130   GFRAA 61 09/08/2020 1620   GFRAA 71 01/03/2020 1130    Lipid Panel     Component Value Date/Time   CHOL 141 04/29/2021 1527   CHOL 206 (H) 01/10/2018 1228   TRIG 244 (H) 04/29/2021 1527   HDL 40 (L) 04/29/2021 1527   HDL 35 (L) 01/10/2018 1228   CHOLHDL 3.5 04/29/2021 1527   LDLCALC 67 04/29/2021 1527    CBC    Component Value Date/Time   WBC 8.2 01/03/2020 1130   RBC 4.71 01/03/2020 1130   HGB 13.3 01/03/2020 1130   HGB 13.1 01/10/2018 1228   HCT 41.4 01/03/2020 1130   HCT 39.1 01/10/2018 1228   PLT 211 01/03/2020 1130   PLT 199 01/10/2018 1228   MCV 87.9 01/03/2020 1130   MCV 87 01/10/2018 1228   MCV 89 07/16/2014 0951   MCH 28.2 01/03/2020 1130   MCHC 32.1 01/03/2020 1130   RDW 13.4 01/03/2020 1130    RDW 12.8 01/10/2018 1228   RDW 13.3 07/16/2014 0951   LYMPHSABS 2,321 01/03/2020 1130   LYMPHSABS 1.9 10/20/2015 1152   EOSABS 123 01/03/2020 1130   EOSABS 0.1 10/20/2015 1152   BASOSABS 33 01/03/2020 1130   BASOSABS 0.0 10/20/2015 1152    Hgb A1C No results found for: "HGBA1C"         Assessment & Plan:     RTC in 6 months for annual exam Webb Silversmith, NP

## 2022-05-03 NOTE — Assessment & Plan Note (Signed)
Controlled on lisinopril and furosemide, refilled today Reinforced DASH diet C-Met today

## 2022-05-03 NOTE — Assessment & Plan Note (Signed)
Stable off meds She is currently being cared for at home by her sons

## 2022-05-03 NOTE — Assessment & Plan Note (Addendum)
C-Met and lipid profile today Encouraged her to consume low-fat diet Continue simvastatin and aspirin

## 2022-05-04 LAB — COMPLETE METABOLIC PANEL WITH GFR
AG Ratio: 1.6 (calc) (ref 1.0–2.5)
ALT: 8 U/L (ref 6–29)
AST: 10 U/L (ref 10–35)
Albumin: 4.2 g/dL (ref 3.6–5.1)
Alkaline phosphatase (APISO): 81 U/L (ref 37–153)
BUN: 16 mg/dL (ref 7–25)
CO2: 28 mmol/L (ref 20–32)
Calcium: 9.4 mg/dL (ref 8.6–10.4)
Chloride: 110 mmol/L (ref 98–110)
Creat: 0.9 mg/dL (ref 0.60–0.95)
Globulin: 2.6 g/dL (calc) (ref 1.9–3.7)
Glucose, Bld: 76 mg/dL (ref 65–99)
Potassium: 3.8 mmol/L (ref 3.5–5.3)
Sodium: 145 mmol/L (ref 135–146)
Total Bilirubin: 0.5 mg/dL (ref 0.2–1.2)
Total Protein: 6.8 g/dL (ref 6.1–8.1)
eGFR: 62 mL/min/{1.73_m2} (ref >=60)

## 2022-05-04 LAB — CBC
HCT: 38.3 % (ref 35.0–45.0)
Hemoglobin: 13.1 g/dL (ref 11.7–15.5)
MCH: 30.6 pg (ref 27.0–33.0)
MCHC: 34.2 g/dL (ref 32.0–36.0)
MCV: 89.5 fL (ref 80.0–100.0)
MPV: 11.6 fL (ref 7.5–12.5)
Platelets: 142 10*3/uL (ref 140–400)
RBC: 4.28 10*6/uL (ref 3.80–5.10)
RDW: 12 % (ref 11.0–15.0)
WBC: 5.3 10*3/uL (ref 3.8–10.8)

## 2022-05-04 LAB — LIPID PANEL
Cholesterol: 151 mg/dL (ref <200)
HDL: 37 mg/dL — ABNORMAL LOW (ref >=50)
LDL Cholesterol (Calc): 81 mg/dL (calc)
Non-HDL Cholesterol (Calc): 114 mg/dL (calc) (ref <130)
Total CHOL/HDL Ratio: 4.1 (calc) (ref <5.0)
Triglycerides: 254 mg/dL — ABNORMAL HIGH (ref <150)

## 2022-05-27 ENCOUNTER — Ambulatory Visit: Payer: Medicare PPO

## 2022-06-10 ENCOUNTER — Ambulatory Visit (INDEPENDENT_AMBULATORY_CARE_PROVIDER_SITE_OTHER): Payer: Medicare PPO

## 2022-06-10 VITALS — Ht 63.0 in | Wt 172.0 lb

## 2022-06-10 DIAGNOSIS — Z0000 Encounter for general adult medical examination without abnormal findings: Secondary | ICD-10-CM | POA: Diagnosis not present

## 2022-06-10 NOTE — Patient Instructions (Signed)
Susan Campos , Thank you for taking time to come for your Medicare Wellness Visit. I appreciate your ongoing commitment to your health goals. Please review the following plan we discussed and let me know if I can assist you in the future.   Screening recommendations/referrals: Colonoscopy: aged out Mammogram: aged out Bone Density: aged out Recommended yearly ophthalmology/optometry visit for glaucoma screening and checkup Recommended yearly dental visit for hygiene and checkup  Vaccinations: Influenza vaccine: 05/03/22 Pneumococcal vaccine: 01/03/20 Tdap vaccine: 01/13/05 Shingles vaccine: Zostavax 01/09/12   Covid-19:n/d  Advanced directives: no  Conditions/risks identified: none  Next appointment: Follow up in one year for your annual wellness visit 06/16/23 @ 11am by phone   Preventive Care 86 Years and Older, Female Preventive care refers to lifestyle choices and visits with your health care provider that can promote health and wellness. What does preventive care include? A yearly physical exam. This is also called an annual well check. Dental exams once or twice a year. Routine eye exams. Ask your health care provider how often you should have your eyes checked. Personal lifestyle choices, including: Daily care of your teeth and gums. Regular physical activity. Eating a healthy diet. Avoiding tobacco and drug use. Limiting alcohol use. Practicing safe sex. Taking low-dose aspirin every day. Taking vitamin and mineral supplements as recommended by your health care provider. What happens during an annual well check? The services and screenings done by your health care provider during your annual well check will depend on your age, overall health, lifestyle risk factors, and family history of disease. Counseling  Your health care provider may ask you questions about your: Alcohol use. Tobacco use. Drug use. Emotional well-being. Home and relationship well-being. Sexual  activity. Eating habits. History of falls. Memory and ability to understand (cognition). Work and work Statistician. Reproductive health. Screening  You may have the following tests or measurements: Height, weight, and BMI. Blood pressure. Lipid and cholesterol levels. These may be checked every 5 years, or more frequently if you are over 86 years old. Skin check. Lung cancer screening. You may have this screening every year starting at age 86 if you have a 30-pack-year history of smoking and currently smoke or have quit within the past 15 years. Fecal occult blood test (FOBT) of the stool. You may have this test every year starting at age 86. Flexible sigmoidoscopy or colonoscopy. You may have a sigmoidoscopy every 5 years or a colonoscopy every 10 years starting at age 86. Hepatitis C blood test. Hepatitis B blood test. Sexually transmitted disease (STD) testing. Diabetes screening. This is done by checking your blood sugar (glucose) after you have not eaten for a while (fasting). You may have this done every 1-3 years. Bone density scan. This is done to screen for osteoporosis. You may have this done starting at age 86. Mammogram. This may be done every 1-2 years. Talk to your health care provider about how often you should have regular mammograms. Talk with your health care provider about your test results, treatment options, and if necessary, the need for more tests. Vaccines  Your health care provider may recommend certain vaccines, such as: Influenza vaccine. This is recommended every year. Tetanus, diphtheria, and acellular pertussis (Tdap, Td) vaccine. You may need a Td booster every 10 years. Zoster vaccine. You may need this after age 86. Pneumococcal 13-valent conjugate (PCV13) vaccine. One dose is recommended after age 86. Pneumococcal polysaccharide (PPSV23) vaccine. One dose is recommended after age 86. Talk to your health  care provider about which screenings and vaccines  you need and how often you need them. This information is not intended to replace advice given to you by your health care provider. Make sure you discuss any questions you have with your health care provider. Document Released: 08/14/2015 Document Revised: 04/06/2016 Document Reviewed: 05/19/2015 Elsevier Interactive Patient Education  2017 [employer] Prevention in the Home Falls can cause injuries. They can happen to people of all ages. There are many things you can do to make your home safe and to help prevent falls. What can I do on the outside of my home? Regularly fix the edges of walkways and driveways and fix any cracks. Remove anything that might make you trip as you walk through a door, such as a raised step or threshold. Trim any bushes or trees on the path to your home. Use bright outdoor lighting. Clear any walking paths of anything that might make someone trip, such as rocks or tools. Regularly check to see if handrails are loose or broken. Make sure that both sides of any steps have handrails. Any raised decks and porches should have guardrails on the edges. Have any leaves, snow, or ice cleared regularly. Use sand or salt on walking paths during winter. Clean up any spills in your garage right away. This includes oil or grease spills. What can I do in the bathroom? Use night lights. Install grab bars by the toilet and in the tub and shower. Do not use towel bars as grab bars. Use non-skid mats or decals in the tub or shower. If you need to sit down in the shower, use a plastic, non-slip stool. Keep the floor dry. Clean up any water that spills on the floor as soon as it happens. Remove soap buildup in the tub or shower regularly. Attach bath mats securely with double-sided non-slip rug tape. Do not have throw rugs and other things on the floor that can make you trip. What can I do in the bedroom? Use night lights. Make sure that you have a light by your bed that  is easy to reach. Do not use any sheets or blankets that are too big for your bed. They should not hang down onto the floor. Have a firm chair that has side arms. You can use this for support while you get dressed. Do not have throw rugs and other things on the floor that can make you trip. What can I do in the kitchen? Clean up any spills right away. Avoid walking on wet floors. Keep items that you use a lot in easy-to-reach places. If you need to reach something above you, use a strong step stool that has a grab bar. Keep electrical cords out of the way. Do not use floor polish or wax that makes floors slippery. If you must use wax, use non-skid floor wax. Do not have throw rugs and other things on the floor that can make you trip. What can I do with my stairs? Do not leave any items on the stairs. Make sure that there are handrails on both sides of the stairs and use them. Fix handrails that are broken or loose. Make sure that handrails are as long as the stairways. Check any carpeting to make sure that it is firmly attached to the stairs. Fix any carpet that is loose or worn. Avoid having throw rugs at the top or bottom of the stairs. If you do have throw rugs, attach them to  the floor with carpet tape. Make sure that you have a light switch at the top of the stairs and the bottom of the stairs. If you do not have them, ask someone to add them for you. What else can I do to help prevent falls? Wear shoes that: Do not have high heels. Have rubber bottoms. Are comfortable and fit you well. Are closed at the toe. Do not wear sandals. If you use a stepladder: Make sure that it is fully opened. Do not climb a closed stepladder. Make sure that both sides of the stepladder are locked into place. Ask someone to hold it for you, if possible. Clearly mark and make sure that you can see: Any grab bars or handrails. First and last steps. Where the edge of each step is. Use tools that help you  move around (mobility aids) if they are needed. These include: Canes. Walkers. Scooters. Crutches. Turn on the lights when you go into a dark area. Replace any light bulbs as soon as they burn out. Set up your furniture so you have a clear path. Avoid moving your furniture around. If any of your floors are uneven, fix them. If there are any pets around you, be aware of where they are. Review your medicines with your doctor. Some medicines can make you feel dizzy. This can increase your chance of falling. Ask your doctor what other things that you can do to help prevent falls. This information is not intended to replace advice given to you by your health care provider. Make sure you discuss any questions you have with your health care provider. Document Released: 05/14/2009 Document Revised: 12/24/2015 Document Reviewed: 08/22/2014 Elsevier Interactive Patient Education  2017 [employer].

## 2022-06-10 NOTE — Progress Notes (Signed)
Virtual Visit via Telephone Note  I connected with  Delories Heinz on 06/10/22 at  1:30 PM EST by telephone and verified that I am speaking with the correct person using two identifiers.  Location: Patient: home Provider: [facility] Persons participating in the virtual visit: [place]   I discussed the limitations, risks, security and privacy concerns of performing an evaluation and management service by telephone and the availability of in person appointments. The patient expressed understanding and agreed to proceed.  Interactive audio and video telecommunications were attempted between this nurse and patient, however failed, due to patient having technical difficulties OR patient did not have access to video capability.  We continued and completed visit with audio only.  Some vital signs may be absent or patient reported.   Dionisio David, LPN  Subjective:   Jennesis Ramaswamy is a 88 y.o. female who presents for Medicare Annual (Subsequent) preventive examination.  Review of Systems     Cardiac Risk Factors include: advanced age (>31mn, >>35women);dyslipidemia;hypertension;obesity (BMI >30kg/m2)     Objective:    There were no vitals filed for this visit. There is no height or weight on file to calculate BMI.     06/10/2022    1:29 PM 01/30/2018    1:17 PM 01/27/2017    9:36 AM 06/29/2016   10:37 AM 11/12/2015    8:53 AM 10/20/2015   10:53 AM 05/27/2015    8:47 AM  Advanced Directives  Does Patient Have a Medical Advance Directive? No Yes Yes Yes No Yes Yes  Type of ACorporate treasurerof AGridleyLiving will Living will;Healthcare Power of [place] will  Living will Living will  Does patient want to make changes to medical advance directive?    No - Patient declined   No - Patient declined  Copy of HFort [place] Chart?  No - copy requested No - copy requested No - copy  requested   Yes  Would patient like information on creating a medical advance directive? No - Patient declined    No - patient declined information      Current Medications (verified) Outpatient Encounter Medications as of 06/10/2022  Medication Sig   acetaminophen (TYLENOL) 650 MG CR tablet Take by mouth every 8 (eight) hours as needed.    albuterol (VENTOLIN HFA) 108 (90 Base) MCG/ACT inhaler INHALE 2 PUFFS INTO THE LUNGS EVERY 6 HOURS AS NEEDED FOR WHEEZING OR SHORTNESS OF BREATH   aspirin EC 81 MG tablet Take 81 mg by mouth daily. 9 am   calcium carbonate (TUMS - DOSED IN MG ELEMENTAL CALCIUM) 500 MG chewable tablet Chew by mouth as needed.   cholecalciferol (VITAMIN D3) 25 MCG (1000 UNIT) tablet Take 1,000 Units by mouth daily.   furosemide (LASIX) 20 MG tablet TAKE 1 TABLET(20 MG) BY MOUTH DAILY   lisinopril (ZESTRIL) 10 MG tablet TAKE 1 TABLET(10 MG) BY MOUTH DAILY   Multiple Vitamin (MULTIVITAMIN) capsule Take 1 capsule by mouth daily. 9 AM   simvastatin (ZOCOR) 20 MG tablet Take 1 tablet (20 mg total) by mouth daily.   No facility-administered encounter medications on file as of 06/10/2022.    Allergies (verified) Lovastatin and Aricept [donepezil hcl]   History: Past Medical History:  Diagnosis Date   Arthritis    HIPS   Asthma    HX OF   Cancer (HSequim    skin   Hypertension    Orthopnea  sleeps in recliner, cannot lay flat on her back   Stroke [facility])    CVA 2012/ MEMORY LOSS, 1999, 1 more in 2011   Vertigo    OCCASIONAL   Past Surgical History:  Procedure Laterality Date   CATARACT EXTRACTION Right    CATARACT EXTRACTION W/PHACO Left 05/27/2015   Procedure: CATARACT EXTRACTION PHACO AND INTRAOCULAR LENS PLACEMENT ([place]);  Surgeon: Leandrew Koyanagi, MD;  Location: [place];  Service: Ophthalmology;  Laterality: Left;   KNEE ARTHROSCOPY     MELANOMA EXCISION     from chest   MOUTH SURGERY     REPLACEMENT TOTAL KNEE BILATERAL     UTERINE  SUSPENSION     Family History  Problem Relation Age of Onset   Heart attack Mother    Heart attack Father    Heart attack Paternal Grandmother    Skin cancer Paternal Grandmother    Social History   Socioeconomic History   Marital status: Married    Spouse name: Not on file   Number of children: 2   Years of education: Not on file   Highest education level: Associate degree: occupational, Hotel manager, or vocational program  Occupational History   Occupation: Retired  Tobacco Use   Smoking status: Former    Packs/day: 0.50    Years: 4.00    Total pack years: 2.00    Types: Cigarettes   Smokeless tobacco: Never   Tobacco comments:    1979  Vaping Use   Vaping Use: Never used  Substance and Sexual Activity   Alcohol use: No   Drug use: No   Sexual activity: Not on file  Other Topics Concern   Not on file  Social History Narrative   Not on file   Social Determinants of Health   Financial Resource Strain: Low Risk  (06/10/2022)   Overall Financial Resource Strain (CARDIA)    Difficulty of Paying Living Expenses: Not hard at all  Food Insecurity: No Food Insecurity (06/10/2022)   Hunger Vital Sign    Worried About Running Out of Food in the Last Year: Never true    [place] in the Last Year: Never true  Transportation Needs: No Transportation Needs (06/10/2022)   PRAPARE - Hydrologist (Medical): No    Lack of Transportation (Non-Medical): No  Physical Activity: Insufficiently Active (06/10/2022)   Exercise Vital Sign    Days of Exercise per Week: 2 days    Minutes of Exercise per Session: 20 min  Stress: No Stress Concern Present (06/10/2022)   [place]    Feeling of Stress : Only a little  Social Connections: Moderately Isolated (06/10/2022)   Social Connection and Isolation Panel [NHANES]    Frequency of Communication with Friends and Family: Once a week     Frequency of Social Gatherings with Friends and Family: Three times a week    Attends Religious Services: Never    Active Member of Clubs or Organizations: No    Attends Archivist Meetings: Never    Marital Status: Married    Tobacco Counseling Counseling given: Not Answered Tobacco comments: 1979   Clinical Intake:  Pre-visit preparation completed: Yes  Pain : No/denies pain     Diabetes: No  How often do you need to have someone help you when you read instructions, pamphlets, or other written materials from your doctor or pharmacy?: 1 - Never  Diabetic?no Interpreter Needed?:  No  Information entered by :: Kirke Shaggy, LPN   Activities of Daily Living    06/10/2022    1:29 PM  In your present state of health, do you have any difficulty performing the following activities:  Hearing? 0  Vision? 0  Difficulty concentrating or making decisions? 1  Walking or climbing stairs? 1  Dressing or bathing? 1  Doing errands, shopping? 1  Preparing Food and eating ? N  Using the Toilet? Y  In the past six months, have you accidently leaked urine? N  Do you have problems with loss of bowel control? N  Managing your Medications? Y  Managing your Finances? Y  Housekeeping or managing your Housekeeping? Y    Patient Care Team: Jearld Fenton, NP as PCP - General (Internal Medicine) Leandrew Koyanagi, MD as Referring Physician (Ophthalmology) Vladimir Crofts, MD as Consulting Physician (Neurology) Greg Cutter, LCSW as Social Worker (Licensed Clinical Social Worker) Melene Plan, RMA  Indicate any recent Medical Services you may have received from other than [facility] providers in the past year (date may be approximate).     Assessment:   This is a routine wellness examination for [place].  Hearing/Vision screen Hearing Screening - Comments:: No aids Vision Screening - Comments:: Readers-    Dietary issues and exercise activities discussed: Current  Exercise Habits: Home exercise routine, Type of exercise: walking, Time (Minutes): 20, Frequency (Times/Week): 2, Weekly Exercise (Minutes/Week): 40, Intensity: Mild   Goals Addressed             This Visit's Progress    DIET - EAT MORE FRUITS AND VEGETABLES         Depression Screen    06/10/2022    1:26 PM 05/03/2022    3:16 PM 01/03/2020   10:46 AM 01/30/2018    1:18 PM 01/27/2017    9:40 AM 01/27/2017    9:37 AM 01/20/2016   10:13 AM  PHQ 2/9 Scores  PHQ - 2 Score 1  0 0 0 0 0  PHQ- 9 Score 3    0    Exception Documentation  Other- indicate reason in comment box       Not completed  dementia         Fall Risk    06/10/2022    1:29 PM 01/03/2020   10:45 AM 01/30/2018    1:18 PM 01/27/2017    9:37 AM 01/20/2016   10:13 AM  [place] in the past year? 0 0 Yes No Yes  Number falls in past yr: 0 0 2 or more  1  Injury with Fall? 0  No  No  Risk for fall due to : No Fall Risks No Fall Risks History of fall(s);Impaired balance/gait;Impaired mobility    Follow up Falls prevention discussed;Falls evaluation completed  Falls prevention discussed  Falls evaluation completed    FALL RISK PREVENTION PERTAINING TO THE HOME:  Any stairs in or around the home? No  If so, are there any without handrails? No  Home free of loose throw rugs in walkways, pet beds, electrical cords, etc? Yes  Adequate lighting in your home to reduce risk of falls? Yes   ASSISTIVE DEVICES UTILIZED TO PREVENT FALLS:  Life alert? No  Use of a cane, walker or w/c? Yes  Grab bars in the bathroom? Yes  Shower chair or bench in shower? Yes  Elevated toilet seat or a handicapped toilet? Yes    Cognitive Function:unable to  complete PT is A & O         01/27/2017    9:41 AM  6CIT Screen  What Year? 0 points  What month? 0 points  What time? 0 points  Count back from 20 0 points  Months in reverse 0 points  Repeat phrase 2 points  Total Score 2 points    Immunizations Immunization History   Administered Date(s) Administered   Fluad Quad(high Dose 65+) 04/29/2021, 05/03/2022   Influenza, High Dose Seasonal PF 07/13/2016, 05/04/2017   Influenza,inj,Quad PF,6+ Mos 09/08/2020   Influenza-Unspecified 05/01/2014   Pneumococcal Conjugate-13 07/09/2014   Pneumococcal Polysaccharide-23 01/03/2020   Td 01/13/2005   Zoster, Live 01/09/2012    TDAP status: Due, Education has been provided regarding the importance of this vaccine. Advised may receive this vaccine at local pharmacy or Health Dept. Aware to provide a copy of the vaccination record if obtained from local pharmacy or Health Dept. Verbalized acceptance and understanding.  Flu Vaccine status: Up to date  Pneumococcal vaccine status: Up to date  Covid-19 vaccine status: Declined, Education has been provided regarding the importance of this vaccine but patient still declined. Advised may receive this vaccine at local pharmacy or Health Dept.or vaccine clinic. Aware to provide a copy of the vaccination record if obtained from local pharmacy or Health Dept. Verbalized acceptance and understanding.  Qualifies for Shingles Vaccine? Yes   Zostavax completed Yes   Shingrix Completed?: No.    Education has been provided regarding the importance of this vaccine. Patient has been advised to call insurance company to determine out of pocket expense if they have not yet received this vaccine. Advised may also receive vaccine at local pharmacy or Health Dept. Verbalized acceptance and understanding.  Screening Tests Health Maintenance  Topic Date Due   COVID-19 Vaccine (1) Never done   Zoster Vaccines- Shingrix (1 of 2) Never done   DEXA SCAN  07/23/2017   TETANUS/TDAP  08/01/2026 (Originally 01/14/2015)   Medicare Annual Wellness (AWV)  06/11/2023   Pneumonia Vaccine 7+ Years old  Completed   INFLUENZA VACCINE  Completed   HPV VACCINES  Aged Out    Health Maintenance  Health Maintenance Due  Topic Date Due   COVID-19 Vaccine  (1) Never done   Zoster Vaccines- Shingrix (1 of 2) Never done   DEXA SCAN  07/23/2017    Colorectal cancer screening: No longer required.   Mammogram status: No longer required due to age.    Lung Cancer Screening: (Low Dose CT Chest recommended if Age 19-80 years, 30 pack-year currently smoking OR have quit w/in 15years.) does not qualify.    Additional Screening:  Hepatitis C Screening: does not qualify; Completed no  Vision Screening: Recommended annual ophthalmology exams for early detection of glaucoma and other disorders of the eye. Is the patient up to date with their annual eye exam?  No  Who is the provider or what is the name of the office in which the patient attends annual eye exams? No one If pt is not established with a provider, would they like to be referred to a provider to establish care? No .   Dental Screening: Recommended annual dental exams for proper oral hygiene  Community Resource Referral / Chronic Care Management: CRR required this visit?  No   CCM required this visit?  No      Plan:     I have personally reviewed and noted the following in the patient's chart:   Medical  and social history Use of alcohol, tobacco or illicit drugs  Current medications and supplements including opioid prescriptions. Patient is not currently taking opioid prescriptions. Functional ability and status Nutritional status Physical activity Advanced directives List of other physicians Hospitalizations, surgeries, and ER visits in previous 12 months Vitals Screenings to include cognitive, depression, and falls Referrals and appointments  In addition, I have reviewed and discussed with patient certain preventive protocols, quality metrics, and best practice recommendations. A written personalized care plan for preventive services as well as general preventive health recommendations were provided to patient.     Dionisio David, LPN   06/84/0335   Nurse Notes:  none

## 2022-08-18 ENCOUNTER — Encounter: Payer: Self-pay | Admitting: Family Medicine

## 2022-08-18 ENCOUNTER — Ambulatory Visit (INDEPENDENT_AMBULATORY_CARE_PROVIDER_SITE_OTHER): Payer: Medicare PPO | Admitting: Family Medicine

## 2022-08-18 VITALS — BP 138/70 | HR 88 | Ht 63.0 in | Wt 172.0 lb

## 2022-08-18 DIAGNOSIS — N760 Acute vaginitis: Secondary | ICD-10-CM

## 2022-08-18 DIAGNOSIS — B3731 Acute candidiasis of vulva and vagina: Secondary | ICD-10-CM

## 2022-08-18 DIAGNOSIS — F330 Major depressive disorder, recurrent, mild: Secondary | ICD-10-CM | POA: Diagnosis not present

## 2022-08-18 MED ORDER — FLUCONAZOLE 150 MG PO TABS: ORAL_TABLET | ORAL | 0 refills | Status: DC

## 2022-08-18 MED ORDER — SERTRALINE HCL 50 MG PO TABS: 50.0000 mg | ORAL_TABLET | Freq: Every day | ORAL | 2 refills | Status: DC

## 2022-08-18 NOTE — Patient Instructions (Addendum)
Thank you for coming to the office today.  Start yeast medication take Diflucan one pill then skip 1 day then take 2nd pill on Day 3  Start anti depression medication '50mg'$  daily for 1 month and continue longer if helpful.  Please schedule a Follow-up Appointment to: Return in about 4 weeks (around 09/15/2022) for 4 week follow up with Rollene Fare for depression, vaginitis/bladder issues.  If you have any other questions or concerns, please feel free to call the office or send a message through [place]. You may also schedule an earlier appointment if necessary.  Additionally, you may be receiving a survey about your experience at our office within a few days to 1 week by e-mail or mail. We value your feedback.  Nobie Putnam, DO [place]

## 2022-08-18 NOTE — Progress Notes (Signed)
Subjective:    Patient ID: Susan Campos, female    DOB: [birth year], 89 y.o.   MRN: [id number]  Susan Campos is a 89 y.o. female presenting on 08/18/2022 for Vaginitis  Patient presents for a same day appointment.  PCP Webb Silversmith, FNP   HPI  Primary caregiver son is here today. He has difficulty hearing.  Yeast Vaginitis Reports last week she was seen at Urgent Care walk in for UTI vs Yeast infection. She did not improve, unsure which treatment she was given. She did not take a pill for it, seems to have used topical. But history is unclear. She does not endorse dysuria or hematuria. She does admit to urinary dysfunction, that seems chronic with some overactive bladder symptoms and seems to have some overflow incontinence.  Major depression, recurrent mild She has history of Alzheimers Dementia, and she had expressed sadness and sad mood at times. Difficulty in providing this history but she reports crying and caregiver acknowledges crying spells. She has not been on anti depressant or medication for her mood recently.       08/18/2022   11:29 AM 06/10/2022    1:26 PM 01/03/2020   10:46 AM  Depression screen PHQ 2/9  Decreased Interest 1 0 0  Down, Depressed, Hopeless 1 1 0  PHQ - 2 Score 2 1 0  Altered sleeping 0 1   Tired, decreased energy 1 1   Change in appetite 0 0   Feeling bad or failure about yourself  0 0   Trouble concentrating 0 0   Moving slowly or fidgety/restless 0 0   Suicidal thoughts 0 0   PHQ-9 Score 3 3   Difficult doing work/chores Not difficult at all Not difficult at all     Social History   Tobacco Use   Smoking status: Former    Packs/day: 0.50    Years: 4.00    Total pack years: 2.00    Types: Cigarettes   Smokeless tobacco: Never   Tobacco comments:    1979  Vaping Use   Vaping Use: Never used  Substance Use Topics   Alcohol use: No   Drug use: No    Review of Systems Per HPI unless specifically indicated  above     Objective:    BP 138/70   Pulse 88   Ht '5\' 3"'$  (1.6 m)   Wt 172 lb (78 kg)   SpO2 98%   BMI 30.47 kg/m   Wt Readings from Last 3 Encounters:  08/18/22 172 lb (78 kg)  06/10/22 172 lb (78 kg)  12/28/21 172 lb (78 kg)    Physical Exam Vitals and nursing note reviewed.  Constitutional:      General: She is not in acute distress.    Appearance: Normal appearance. She is well-developed. She is not diaphoretic.     Comments: Well-appearing, comfortable, cooperative  HENT:     Head: Normocephalic and atraumatic.  Eyes:     General:        Right eye: No discharge.        Left eye: No discharge.     Conjunctiva/sclera: Conjunctivae normal.  Cardiovascular:     Rate and Rhythm: Normal rate.  Pulmonary:     Effort: Pulmonary effort is normal.  Skin:    General: Skin is warm and dry.     Findings: No erythema or rash.  Neurological:     Mental Status: She is alert and oriented to person, place,  and time.  Psychiatric:        Behavior: Behavior normal.        Thought Content: Thought content normal.     Comments: Normal speech and thoughts. Not always participating in history as well but she is cooperative.    Results for orders placed or performed in visit on 05/03/22  CBC  Result Value Ref Range   WBC 5.3 3.8 - 10.8 Thousand/uL   RBC 4.28 3.80 - 5.10 Million/uL   Hemoglobin 13.1 11.7 - 15.5 g/dL   HCT 38.3 35.0 - 45.0 %   MCV 89.5 80.0 - 100.0 fL   MCH 30.6 27.0 - 33.0 pg   MCHC 34.2 32.0 - 36.0 g/dL   RDW 12.0 11.0 - 15.0 %   Platelets 142 140 - 400 Thousand/uL   MPV 11.6 7.5 - 12.5 fL  COMPLETE METABOLIC PANEL WITH GFR  Result Value Ref Range   Glucose, Bld 76 65 - 99 mg/dL   BUN 16 7 - 25 mg/dL   Creat 0.90 0.60 - 0.95 mg/dL   eGFR 62 > OR = 60 mL/min/1.72m   BUN/Creatinine Ratio SEE NOTE: 6 - 22 (calc)   Sodium 145 135 - 146 mmol/L   Potassium 3.8 3.5 - 5.3 mmol/L   Chloride 110 98 - 110 mmol/L   CO2 28 20 - 32 mmol/L   Calcium 9.4 8.6 - 10.4  mg/dL   Total Protein 6.8 6.1 - 8.1 g/dL   Albumin 4.2 3.6 - 5.1 g/dL   Globulin 2.6 1.9 - 3.7 g/dL (calc)   AG Ratio 1.6 1.0 - 2.5 (calc)   Total Bilirubin 0.5 0.2 - 1.2 mg/dL   Alkaline phosphatase (APISO) 81 37 - 153 U/L   AST 10 10 - 35 U/L   ALT 8 6 - 29 U/L  Lipid panel  Result Value Ref Range   Cholesterol 151 <200 mg/dL   HDL 37 (L) > OR = 50 mg/dL   Triglycerides 254 (H) <150 mg/dL   LDL Cholesterol (Calc) 81 mg/dL (calc)   Total CHOL/HDL Ratio 4.1 <5.0 (calc)   Non-HDL Cholesterol (Calc) 114 <130 mg/dL (calc)      Assessment & Plan:   Problem List Items Addressed This Visit   None Visit Diagnoses     Yeast vaginitis    -  Primary   Relevant Medications   fluconazole (DIFLUCAN) 150 MG tablet   Acute vaginitis       Depression, major, recurrent, mild (HCC)       Relevant Medications   sertraline (ZOLOFT) 50 MG tablet       Yeast Vaginitis based on history No symptoms to suggest UTI Will order Fluconazole course for yeast infection If not resolving, would check Urinalysis.  Major depression recurrent Secondary factor with Alzheimers Dementia Will initiate SSRI therapy trial with Sertraline '50mg'$  daily  Recommend follow up within 4 weeks with her usual primary caregiver (other son) and return to see PCP to discuss benefit of SSRI or other alternative therapy options. May consider Trazodone if having issues with emotional lability or behavioral issue and sleep.  Meds ordered this encounter  Medications   fluconazole (DIFLUCAN) 150 MG tablet    Sig: Take one tablet by mouth on Day 1. Repeat dose 2nd tablet on Day 3.    Dispense:  2 tablet    Refill:  0   sertraline (ZOLOFT) 50 MG tablet    Sig: Take 1 tablet (50 mg total) by mouth daily.  Dispense:  30 tablet    Refill:  2      Follow up plan: Return in about 4 weeks (around 09/15/2022) for 4 week follow up with Rollene Fare for depression, vaginitis/bladder issues.   Nobie Putnam, [facility] 08/18/2022, 11:43 AM

## 2022-09-16 ENCOUNTER — Encounter: Payer: Self-pay | Admitting: Internal Medicine

## 2022-09-16 ENCOUNTER — Ambulatory Visit (INDEPENDENT_AMBULATORY_CARE_PROVIDER_SITE_OTHER): Payer: Medicare PPO | Admitting: Internal Medicine

## 2022-09-16 VITALS — BP 124/80 | HR 62 | Temp 96.9°F

## 2022-09-16 DIAGNOSIS — Z0001 Encounter for general adult medical examination with abnormal findings: Secondary | ICD-10-CM

## 2022-09-16 DIAGNOSIS — R7309 Other abnormal glucose: Secondary | ICD-10-CM

## 2022-09-16 DIAGNOSIS — F320 Major depressive disorder, single episode, mild: Secondary | ICD-10-CM

## 2022-09-16 DIAGNOSIS — F330 Major depressive disorder, recurrent, mild: Secondary | ICD-10-CM

## 2022-09-16 DIAGNOSIS — E7800 Pure hypercholesterolemia, unspecified: Secondary | ICD-10-CM | POA: Diagnosis not present

## 2022-09-16 DIAGNOSIS — F32A Depression, unspecified: Secondary | ICD-10-CM | POA: Insufficient documentation

## 2022-09-16 MED ORDER — TRAZODONE HCL 50 MG PO TABS: 25.0000 mg | ORAL_TABLET | Freq: Every evening | ORAL | 0 refills | Status: DC | PRN

## 2022-09-16 MED ORDER — SERTRALINE HCL 50 MG PO TABS: 50.0000 mg | ORAL_TABLET | Freq: Every day | ORAL | 1 refills | Status: DC

## 2022-09-16 MED ORDER — NYSTATIN 100000 UNIT/GM EX POWD: 1.0000 | Freq: Three times a day (TID) | CUTANEOUS | 0 refills | Status: DC

## 2022-09-16 NOTE — Progress Notes (Signed)
Subjective:    Patient ID: Susan Campos, female    DOB: 1933/03/31, 89 y.o.   MRN: [id number]  HPI  Patient presents to clinic today for an annual exam.  Flu: 05/2022 Tetanus: 12/2004 COVID: never Pneumovax: 12/2019 Prevnar: 07/2014 Zostavax: 12/2011 Shingrix: Never Pap smear: No longer screening Mammogram: No longer screening Bone density: 07/2014 Colon screening: No longer screening Vision screening: as needed Dentist: as needed  Diet: She does eat meat. She consumes fruits and veggies, She does eat some fried foods. She drinks mostly water. Exercise: None  Review of Systems     Past Medical History:  Diagnosis Date   Arthritis    HIPS   Asthma    HX OF   Cancer ([place])    skin   Hypertension    Orthopnea    sleeps in recliner, cannot lay flat on her back   Stroke [facility])    CVA 2012/ MEMORY LOSS, 1999, 1 more in 2011   Vertigo    OCCASIONAL    Current Outpatient Medications  Medication Sig Dispense Refill   acetaminophen (TYLENOL) 650 MG CR tablet Take by mouth every 8 (eight) hours as needed.      albuterol (VENTOLIN HFA) 108 (90 Base) MCG/ACT inhaler INHALE 2 PUFFS INTO THE LUNGS EVERY 6 HOURS AS NEEDED FOR WHEEZING OR SHORTNESS OF BREATH 54 g 1   aspirin EC 81 MG tablet Take 81 mg by mouth daily. 9 am     calcium carbonate (TUMS - DOSED IN MG ELEMENTAL CALCIUM) 500 MG chewable tablet Chew by mouth as needed.     cholecalciferol (VITAMIN D3) 25 MCG (1000 UNIT) tablet Take 1,000 Units by mouth daily.     fluconazole (DIFLUCAN) 150 MG tablet Take one tablet by mouth on Day 1. Repeat dose 2nd tablet on Day 3. 2 tablet 0   furosemide (LASIX) 20 MG tablet TAKE 1 TABLET(20 MG) BY MOUTH DAILY 90 tablet 1   lisinopril (ZESTRIL) 10 MG tablet TAKE 1 TABLET(10 MG) BY MOUTH DAILY 90 tablet 1   Multiple Vitamin (MULTIVITAMIN) capsule Take 1 capsule by mouth daily. 9 AM     sertraline (ZOLOFT) 50 MG tablet Take 1 tablet (50 mg total) by mouth daily. 30 tablet 2    simvastatin (ZOCOR) 20 MG tablet Take 1 tablet (20 mg total) by mouth daily. 90 tablet 1   No current facility-administered medications for this visit.    Allergies  Allergen Reactions   Lovastatin Shortness Of Breath and Palpitations   Aricept [Donepezil Hcl]     dizziness    Family History  Problem Relation Age of Onset   Heart attack Mother    Heart attack Father    Heart attack Paternal Grandmother    Skin cancer Paternal Grandmother     Social History   Socioeconomic History   Marital status: Married    Spouse name: Not on file   Number of children: 2   Years of education: Not on file   Highest education level: Associate degree: occupational, Hotel manager, or vocational program  Occupational History   Occupation: Retired  Tobacco Use   Smoking status: Former    Packs/day: 0.50    Years: 4.00    Total pack years: 2.00    Types: Cigarettes   Smokeless tobacco: Never   Tobacco comments:    1979  Vaping Use   Vaping Use: Never used  Substance and Sexual Activity   Alcohol use: No   Drug use: No  Sexual activity: Not on file  Other Topics Concern   Not on file  Social History Narrative   Not on file   Social Determinants of Health   Financial Resource Strain: Low Risk  (06/10/2022)   Overall Financial Resource Strain (CARDIA)    Difficulty of Paying Living Expenses: Not hard at all  Food Insecurity: No Food Insecurity (06/10/2022)   Hunger Vital Sign    Worried About Running Out of Food in the Last Year: Never true    Ran Out of Food in the Last Year: Never true  Transportation Needs: No Transportation Needs (06/10/2022)   PRAPARE - Hydrologist (Medical): No    Lack of Transportation (Non-Medical): No  Physical Activity: Insufficiently Active (06/10/2022)   Exercise Vital Sign    Days of Exercise per Week: 2 days    Minutes of Exercise per Session: 20 min  Stress: No Stress Concern Present (06/10/2022)   [place]    Feeling of Stress : Only a little  Social Connections: Moderately Isolated (06/10/2022)   Social Connection and Isolation Panel [NHANES]    Frequency of Communication with Friends and Family: Once a week    Frequency of Social Gatherings with Friends and Family: Three times a week    Attends Religious Services: Never    Active Member of Clubs or Organizations: No    Attends Archivist Meetings: Never    Marital Status: Married  Human resources officer Violence: Not At Risk (06/10/2022)   Humiliation, Afraid, Rape, and Kick questionnaire    Fear of Current or Ex-Partner: No    Emotionally Abused: No    Physically Abused: No    Sexually Abused: No     Constitutional: Denies fever, malaise, fatigue, headache or abrupt weight changes.  HEENT: Denies eye pain, eye redness, ear pain, ringing in the ears, wax buildup, runny nose, nasal congestion, bloody nose, or sore throat. Respiratory: Denies difficulty breathing, shortness of breath, cough or sputum production.   Cardiovascular: Denies chest pain, chest tightness, palpitations or swelling in the hands or feet.  Gastrointestinal: Patient son reports intermittent constipation.  Denies abdominal pain, bloating, diarrhea or blood in the stool.  GU: Patient son reports urinary incontinence.  Denies frequency, pain with urination, burning sensation, blood in urine, odor or discharge. Musculoskeletal: Patient reports joint pain, difficulty with gait..  Denies decrease in range of motion, muscle pain or joint swelling.  Skin: Patient son reports some pressure area to buttocks.  Denies rashes, lesions or ulcercations.  Neurological: Patient reports insomnia, difficulty with memory, problems with balance and coordination.  Denies dizziness, difficulty with speech.  Psych: Patient has a history of depression.  Denies anxiety, SI/HI.  No other specific complaints in a complete review of  systems (except as listed in HPI above).  Objective:   Physical Exam  BP 124/80 (BP Location: Left Arm, Patient Position: Sitting, Cuff Size: Normal)   Pulse 62   Temp (!) 96.9 F (36.1 C) (Temporal)   SpO2 98%   Wt Readings from Last 3 Encounters:  08/18/22 172 lb (78 kg)  06/10/22 172 lb (78 kg)  12/28/21 172 lb (78 kg)    General: Appears her stated age, obese chronically ill-appearing, in NAD. Skin: Warm, dry and intact.  HEENT: Head: normal shape and size; Eyes: sclera white, no icterus, conjunctiva pink, PERRLA and EOMs intact;  Cardiovascular: Normal rate and rhythm. S1,S2 noted.  No murmur,  rubs or gallops noted. No JVD.  Trace BLE edema. No carotid bruits noted. Pulmonary/Chest: Normal effort and positive vesicular breath sounds. No respiratory distress. No wheezes, rales or ronchi noted.  Abdomen: Soft and nontender. Normal bowel sounds.  Musculoskeletal: In wheelchair. Neurological: Alert.  Has difficulty with recall but answers questions appropriately. Psychiatric: Mood and affect normal. Behavior is normal. Judgment and thought content normal.    BMET    Component Value Date/Time   NA 145 05/03/2022 1517   NA 145 (H) 09/08/2020 1620   NA 139 07/30/2014 0633   K 3.8 05/03/2022 1517   K 4.0 07/30/2014 0633   CL 110 05/03/2022 1517   CL 104 07/30/2014 0633   CO2 28 05/03/2022 1517   CO2 30 07/30/2014 0633   GLUCOSE 76 05/03/2022 1517   GLUCOSE 101 (H) 07/30/2014 0633   BUN 16 05/03/2022 1517   BUN 16 09/08/2020 1620   BUN 8 07/30/2014 0633   CREATININE 0.90 05/03/2022 1517   CALCIUM 9.4 05/03/2022 1517   CALCIUM 8.5 07/30/2014 0633   GFRNONAA 53 (L) 09/08/2020 1620   GFRNONAA 61 01/03/2020 1130   GFRAA 61 09/08/2020 1620   GFRAA 71 01/03/2020 1130    Lipid Panel     Component Value Date/Time   CHOL 151 05/03/2022 1517   CHOL 206 (H) 01/10/2018 1228   TRIG 254 (H) 05/03/2022 1517   HDL 37 (L) 05/03/2022 1517   HDL 35 (L) 01/10/2018 1228   CHOLHDL  4.1 05/03/2022 1517   LDLCALC 81 05/03/2022 1517    CBC    Component Value Date/Time   WBC 5.3 05/03/2022 1517   RBC 4.28 05/03/2022 1517   HGB 13.1 05/03/2022 1517   HGB 13.1 01/10/2018 1228   HCT 38.3 05/03/2022 1517   HCT 39.1 01/10/2018 1228   PLT 142 05/03/2022 1517   PLT 199 01/10/2018 1228   MCV 89.5 05/03/2022 1517   MCV 87 01/10/2018 1228   MCV 89 07/16/2014 0951   MCH 30.6 05/03/2022 1517   MCHC 34.2 05/03/2022 1517   RDW 12.0 05/03/2022 1517   RDW 12.8 01/10/2018 1228   RDW 13.3 07/16/2014 0951   LYMPHSABS 2,321 01/03/2020 1130   LYMPHSABS 1.9 10/20/2015 1152   EOSABS 123 01/03/2020 1130   EOSABS 0.1 10/20/2015 1152   BASOSABS 33 01/03/2020 1130   BASOSABS 0.0 10/20/2015 1152    Hgb A1C No results found for: "HGBA1C"         Assessment & Plan:   Preventative Health Maintenance:  Encouraged her to get a flu shot in the fall She declines tetanus for financial reasons, advised if she gets bit or cut to go get this done Encouraged her to get her COVID vaccine Pneumovax UTD She declines Prevnar 20 today Zostavax UTD Discussed Shingrix vaccine, she will check coverage with her insurance company and schedule a visit at the pharmacy if she would like to have this done She no longer wants to screen for cervical cancer She no longer wants to screen for breast cancer She declines flu density She no longer wants to screen for colon cancer Encouraged her to consume a balanced diet and exercise regimen Advised her to see an eye doctor and dentist annually We will check CBC, c-Met, lipid and A1c today  RTC in 6 months, follow-up chronic conditions Webb Silversmith, NP

## 2022-09-16 NOTE — Patient Instructions (Signed)
Health Maintenance for Postmenopausal Women Menopause is a normal process in which your ability to get pregnant comes to an end. This process happens slowly over many months or years, usually between the ages of 48 and 55. Menopause is complete when you have missed your menstrual period for 12 months. It is important to talk with your health care provider about some of the most common conditions that affect women after menopause (postmenopausal women). These include heart disease, cancer, and bone loss (osteoporosis). Adopting a healthy lifestyle and getting preventive care can help to promote your health and wellness. The actions you take can also lower your chances of developing some of these common conditions. What are the signs and symptoms of menopause? During menopause, you may have the following symptoms: Hot flashes. These can be moderate or severe. Night sweats. Decrease in sex drive. Mood swings. Headaches. Tiredness (fatigue). Irritability. Memory problems. Problems falling asleep or staying asleep. Talk with your health care provider about treatment options for your symptoms. Do I need hormone replacement therapy? Hormone replacement therapy is effective in treating symptoms that are caused by menopause, such as hot flashes and night sweats. Hormone replacement carries certain risks, especially as you become older. If you are thinking about using estrogen or estrogen with progestin, discuss the benefits and risks with your health care provider. How can I reduce my risk for heart disease and stroke? The risk of heart disease, heart attack, and stroke increases as you age. One of the causes may be a change in the body's hormones during menopause. This can affect how your body uses dietary fats, triglycerides, and cholesterol. Heart attack and stroke are medical emergencies. There are many things that you can do to help prevent heart disease and stroke. Watch your blood pressure High  blood pressure causes heart disease and increases the risk of stroke. This is more likely to develop in people who have high blood pressure readings or are overweight. Have your blood pressure checked: Every 3-5 years if you are 18-39 years of age. Every year if you are 40 years old or older. Eat a healthy diet  Eat a diet that includes plenty of vegetables, fruits, low-fat dairy products, and lean protein. Do not eat a lot of foods that are high in solid fats, added sugars, or sodium. Get regular exercise Get regular exercise. This is one of the most important things you can do for your health. Most adults should: Try to exercise for at least 150 minutes each week. The exercise should increase your heart rate and make you sweat (moderate-intensity exercise). Try to do strengthening exercises at least twice each week. Do these in addition to the moderate-intensity exercise. Spend less time sitting. Even light physical activity can be beneficial. Other tips Work with your health care provider to achieve or maintain a healthy weight. Do not use any products that contain nicotine or tobacco. These products include cigarettes, chewing tobacco, and vaping devices, such as e-cigarettes. If you need help quitting, ask your health care provider. Know your numbers. Ask your health care provider to check your cholesterol and your blood sugar (glucose). Continue to have your blood tested as directed by your health care provider. Do I need screening for cancer? Depending on your health history and family history, you may need to have cancer screenings at different stages of your life. This may include screening for: Breast cancer. Cervical cancer. Lung cancer. Colorectal cancer. What is my risk for osteoporosis? After menopause, you may be   at increased risk for osteoporosis. Osteoporosis is a condition in which bone destruction happens more quickly than new bone creation. To help prevent osteoporosis or  the bone fractures that can happen because of osteoporosis, you may take the following actions: If you are 19-50 years old, get at least 1,000 mg of calcium and at least 600 international units (IU) of vitamin D per day. If you are older than age 50 but younger than age 70, get at least 1,200 mg of calcium and at least 600 international units (IU) of vitamin D per day. If you are older than age 70, get at least 1,200 mg of calcium and at least 800 international units (IU) of vitamin D per day. Smoking and drinking excessive alcohol increase the risk of osteoporosis. Eat foods that are rich in calcium and vitamin D, and do weight-bearing exercises several times each week as directed by your health care provider. How does menopause affect my mental health? Depression may occur at any age, but it is more common as you become older. Common symptoms of depression include: Feeling depressed. Changes in sleep patterns. Changes in appetite or eating patterns. Feeling an overall lack of motivation or enjoyment of activities that you previously enjoyed. Frequent crying spells. Talk with your health care provider if you think that you are experiencing any of these symptoms. General instructions See your health care provider for regular wellness exams and vaccines. This may include: Scheduling regular health, dental, and eye exams. Getting and maintaining your vaccines. These include: Influenza vaccine. Get this vaccine each year before the flu season begins. Pneumonia vaccine. Shingles vaccine. Tetanus, diphtheria, and pertussis (Tdap) booster vaccine. Your health care provider may also recommend other immunizations. Tell your health care provider if you have ever been abused or do not feel safe at home. Summary Menopause is a normal process in which your ability to get pregnant comes to an end. This condition causes hot flashes, night sweats, decreased interest in sex, mood swings, headaches, or lack  of sleep. Treatment for this condition may include hormone replacement therapy. Take actions to keep yourself healthy, including exercising regularly, eating a healthy diet, watching your weight, and checking your blood pressure and blood sugar levels. Get screened for cancer and depression. Make sure that you are up to date with all your vaccines. This information is not intended to replace advice given to you by your health care provider. Make sure you discuss any questions you have with your health care provider. Document Revised: 12/07/2020 Document Reviewed: 12/07/2020 Elsevier Patient Education  2023 Elsevier Inc.  

## 2022-09-17 LAB — COMPLETE METABOLIC PANEL WITH GFR
AG Ratio: 1.8 (calc) (ref 1.0–2.5)
ALT: 8 U/L (ref 6–29)
AST: 11 U/L (ref 10–35)
Albumin: 4.4 g/dL (ref 3.6–5.1)
Alkaline phosphatase (APISO): 77 U/L (ref 37–153)
BUN: 14 mg/dL (ref 7–25)
CO2: 30 mmol/L (ref 20–32)
Calcium: 9.3 mg/dL (ref 8.6–10.4)
Chloride: 107 mmol/L (ref 98–110)
Creat: 0.95 mg/dL (ref 0.60–0.95)
Globulin: 2.5 g/dL (calc) (ref 1.9–3.7)
Glucose, Bld: 77 mg/dL (ref 65–99)
Potassium: 3.9 mmol/L (ref 3.5–5.3)
Sodium: 145 mmol/L (ref 135–146)
Total Bilirubin: 0.7 mg/dL (ref 0.2–1.2)
Total Protein: 6.9 g/dL (ref 6.1–8.1)
eGFR: 58 mL/min/{1.73_m2} — ABNORMAL LOW (ref >=60)

## 2022-09-17 LAB — CBC
HCT: 37.8 % (ref 35.0–45.0)
Hemoglobin: 12.9 g/dL (ref 11.7–15.5)
MCH: 30.5 pg (ref 27.0–33.0)
MCHC: 34.1 g/dL (ref 32.0–36.0)
MCV: 89.4 fL (ref 80.0–100.0)
MPV: 11.4 fL (ref 7.5–12.5)
Platelets: 139 10*3/uL — ABNORMAL LOW (ref 140–400)
RBC: 4.23 10*6/uL (ref 3.80–5.10)
RDW: 12.3 % (ref 11.0–15.0)
WBC: 4 10*3/uL (ref 3.8–10.8)

## 2022-09-17 LAB — HEMOGLOBIN A1C
Hgb A1c MFr Bld: 5.4 % of total Hgb (ref <5.7)
Mean Plasma Glucose: 108 mg/dL
eAG (mmol/L): 6 mmol/L

## 2022-09-17 LAB — LIPID PANEL
Cholesterol: 172 mg/dL (ref <200)
HDL: 37 mg/dL — ABNORMAL LOW (ref >=50)
LDL Cholesterol (Calc): 105 mg/dL (calc) — ABNORMAL HIGH
Non-HDL Cholesterol (Calc): 135 mg/dL (calc) — ABNORMAL HIGH (ref <130)
Total CHOL/HDL Ratio: 4.6 (calc) (ref <5.0)
Triglycerides: 182 mg/dL — ABNORMAL HIGH (ref <150)

## 2022-11-27 ENCOUNTER — Other Ambulatory Visit: Payer: Self-pay | Admitting: Internal Medicine

## 2022-11-27 DIAGNOSIS — E7800 Pure hypercholesterolemia, unspecified: Secondary | ICD-10-CM

## 2022-11-27 DIAGNOSIS — R609 Edema, unspecified: Secondary | ICD-10-CM

## 2022-11-29 NOTE — Telephone Encounter (Signed)
Requested Prescriptions  Pending Prescriptions Disp Refills   furosemide (LASIX) 20 MG tablet [Pharmacy Med Name: FUROSEMIDE 20MG  TABLETS] 90 tablet 1    Sig: TAKE 1 TABLET(20 MG) BY MOUTH DAILY     Cardiovascular:  Diuretics - Loop Failed - 11/27/2022  7:05 AM      Failed - Mg Level in normal range and within 180 days    Magnesium  Date Value Ref Range Status  02/23/2012 2.3 mg/dL Final    Comment:    1.6-1.0 THERAPEUTIC RANGE: 4-7 mg/dL TOXIC: > 10 mg/dL  -----------------------          Passed - K in normal range and within 180 days    Potassium  Date Value Ref Range Status  09/16/2022 3.9 3.5 - 5.3 mmol/L Final  07/30/2014 4.0 3.5 - 5.1 mmol/L Final         Passed - Ca in normal range and within 180 days    Calcium  Date Value Ref Range Status  09/16/2022 9.3 8.6 - 10.4 mg/dL Final   Calcium, Total  Date Value Ref Range Status  07/30/2014 8.5 8.5 - 10.1 mg/dL Final         Passed - Na in normal range and within 180 days    Sodium  Date Value Ref Range Status  09/16/2022 145 135 - 146 mmol/L Final  09/08/2020 145 (H) 134 - 144 mmol/L Final  07/30/2014 139 136 - 145 mmol/L Final         Passed - Cr in normal range and within 180 days    Creat  Date Value Ref Range Status  09/16/2022 0.95 0.60 - 0.95 mg/dL Final         Passed - Cl in normal range and within 180 days    Chloride  Date Value Ref Range Status  09/16/2022 107 98 - 110 mmol/L Final  07/30/2014 104 98 - 107 mmol/L Final         Passed - Last BP in normal range    BP Readings from Last 1 Encounters:  09/16/22 124/80         Passed - Valid encounter within last 6 months    Recent Outpatient Visits           2 months ago Encounter for general adult medical examination with abnormal findings   [place] [facility] [place], Salvadore Oxford, NP   3 months ago Yeast vaginitis   [place] [facility] Smitty Cords, DO   7 months ago Hypercholesteremia    [place] [facility] Lorre [place], NP   11 months ago Acute cystitis with hematuria   [place] [facility] Smitty Cords, DO   1 year ago Hypercholesteremia   [place] [facility] [place], Salvadore Oxford, NP               simvastatin (ZOCOR) 20 MG tablet [Pharmacy Med Name: SIMVASTATIN 20MG  TABLETS] 90 tablet 1    Sig: TAKE 1 TABLET(20 MG) BY MOUTH DAILY     Cardiovascular:  Antilipid - Statins Failed - 11/27/2022  7:05 AM      Failed - Lipid Panel in normal range within the last 12 months    Cholesterol, Total  Date Value Ref Range Status  01/10/2018 206 (H) 100 - 199 mg/dL Final   Cholesterol  Date Value Ref Range Status  09/16/2022 172 <200 mg/dL Final   LDL Cholesterol (  Calc)  Date Value Ref Range Status  09/16/2022 105 (H) mg/dL (calc) Final    Comment:    Reference range: <100 . Desirable range <100 mg/dL for primary prevention;   <70 mg/dL for patients with CHD or diabetic patients  with > or = 2 CHD risk factors. Marland Kitchen LDL-C is now calculated using the Martin-Hopkins  calculation, which is a validated novel method providing  better accuracy than the Friedewald equation in the  estimation of LDL-C.  Horald Pollen et al. Lenox Ahr. 1610;960(45): 2061-2068  (http://education.[employer].com/faq/FAQ164)    HDL  Date Value Ref Range Status  09/16/2022 37 (L) > OR = 50 mg/dL Final  40/98/1191 35 (L) >39 mg/dL Final   Triglycerides  Date Value Ref Range Status  09/16/2022 182 (H) <150 mg/dL Final         Passed - Patient is not pregnant      Passed - Valid encounter within last 12 months    Recent Outpatient Visits           2 months ago Encounter for general adult medical examination with abnormal findings   [place] [facility] [place], Salvadore Oxford, NP   3 months ago Yeast vaginitis   [place] [facility] Smitty Cords, DO   7 months ago  Hypercholesteremia   [place] [facility] [place], Salvadore Oxford, NP   11 months ago Acute cystitis with hematuria   [place] [facility] Smitty Cords, DO   1 year ago Hypercholesteremia   [place] [facility] [place], [place] W, NP               traZODone (DESYREL) 50 MG tablet [Pharmacy Med Name: TRAZODONE 50MG  TABLETS] 90 tablet 0    Sig: TAKE 1/2 TO 1 TABLET(25 TO 50 MG) BY MOUTH AT BEDTIME AS NEEDED FOR SLEEP     Psychiatry: Antidepressants - Serotonin Modulator Passed - 11/27/2022  7:05 AM      Passed - Completed PHQ-2 or PHQ-9 in the last 360 days      Passed - Valid encounter within last 6 months    Recent Outpatient Visits           2 months ago Encounter for general adult medical examination with abnormal findings   [place] [facility] [place], Salvadore Oxford, NP   3 months ago Yeast vaginitis   [facility] Smitty Cords, DO   7 months ago Hypercholesteremia   [place] [facility] [place], Salvadore Oxford, NP   11 months ago Acute cystitis with hematuria   [place] [facility] Smitty Cords, DO   1 year ago Hypercholesteremia   [place] [facility] [place], Salvadore Oxford, [place]

## 2022-12-12 ENCOUNTER — Encounter: Payer: Self-pay | Admitting: Internal Medicine

## 2022-12-12 ENCOUNTER — Ambulatory Visit: Payer: Medicare PPO | Admitting: Internal Medicine

## 2022-12-12 VITALS — BP 124/64 | HR 71 | Temp 96.8°F

## 2022-12-12 DIAGNOSIS — Z23 Encounter for immunization: Secondary | ICD-10-CM | POA: Diagnosis not present

## 2022-12-12 DIAGNOSIS — W5503XA Scratched by cat, initial encounter: Secondary | ICD-10-CM | POA: Diagnosis not present

## 2022-12-12 DIAGNOSIS — S60519A Abrasion of unspecified hand, initial encounter: Secondary | ICD-10-CM

## 2022-12-12 MED ORDER — SIMVASTATIN 20 MG PO TABS: ORAL_TABLET | ORAL | 0 refills | Status: DC

## 2022-12-12 MED ORDER — SERTRALINE HCL 50 MG PO TABS: 50.0000 mg | ORAL_TABLET | Freq: Every day | ORAL | 0 refills | Status: DC

## 2022-12-12 MED ORDER — LISINOPRIL 10 MG PO TABS: ORAL_TABLET | ORAL | 0 refills | Status: DC

## 2022-12-12 MED ORDER — FUROSEMIDE 20 MG PO TABS: ORAL_TABLET | ORAL | 0 refills | Status: DC

## 2022-12-12 MED ORDER — AMOXICILLIN-POT CLAVULANATE 875-125 MG PO TABS: 1.0000 | ORAL_TABLET | Freq: Two times a day (BID) | ORAL | 0 refills | Status: DC

## 2022-12-12 NOTE — Addendum Note (Signed)
Addended by: Kavin Leech E on: 12/12/2022 04:28 PM   Modules accepted: Orders

## 2022-12-12 NOTE — Progress Notes (Signed)
Subjective:    Patient ID: Susan Campos, female    DOB: 07-16-1933, 89 y.o.   MRN: [id number]  HPI  Patient presents to clinic today with complaint of cat scratch to her bilateral hands.  This occurred 5 days ago.  Her son reports there has been some redness around the areas.  He is concerned for infection.  He has not noticed that she has had fever, chills, nausea or vomiting.  He has cleansed the wound with Hydrogen Peroxide and applied Neosporin.  Her last tetanus was in 2006.  Review of Systems     Past Medical History:  Diagnosis Date   Arthritis    HIPS   Asthma    HX OF   Cancer (HCC)    skin   Hypertension    Orthopnea    sleeps in recliner, cannot lay flat on her back   Stroke [facility])    CVA 2012/ MEMORY LOSS, 1999, 1 more in 2011   Vertigo    OCCASIONAL    Current Outpatient Medications  Medication Sig Dispense Refill   acetaminophen (TYLENOL) 650 MG CR tablet Take by mouth every 8 (eight) hours as needed.      albuterol (VENTOLIN HFA) 108 (90 Base) MCG/ACT inhaler INHALE 2 PUFFS INTO THE LUNGS EVERY 6 HOURS AS NEEDED FOR WHEEZING OR SHORTNESS OF BREATH 54 g 1   aspirin EC 81 MG tablet Take 81 mg by mouth daily. 9 am     calcium carbonate (TUMS - DOSED IN MG ELEMENTAL CALCIUM) 500 MG chewable tablet Chew by mouth as needed.     cholecalciferol (VITAMIN D3) 25 MCG (1000 UNIT) tablet Take 1,000 Units by mouth daily.     furosemide (LASIX) 20 MG tablet TAKE 1 TABLET(20 MG) BY MOUTH DAILY 90 tablet 1   lisinopril (ZESTRIL) 10 MG tablet TAKE 1 TABLET(10 MG) BY MOUTH DAILY 90 tablet 1   Multiple Vitamin (MULTIVITAMIN) capsule Take 1 capsule by mouth daily. 9 AM     nystatin (MYCOSTATIN/NYSTOP) powder Apply 1 Application topically 3 (three) times daily. 60 g 0   sertraline (ZOLOFT) 50 MG tablet Take 1 tablet (50 mg total) by mouth daily. 90 tablet 1   simvastatin (ZOCOR) 20 MG tablet TAKE 1 TABLET(20 MG) BY MOUTH DAILY 90 tablet 1   traZODone (DESYREL) 50 MG  tablet TAKE 1/2 TO 1 TABLET(25 TO 50 MG) BY MOUTH AT BEDTIME AS NEEDED FOR SLEEP 90 tablet 0   No current facility-administered medications for this visit.    Allergies  Allergen Reactions   Lovastatin Shortness Of Breath and Palpitations   Aricept [Donepezil Hcl]     dizziness    Family History  Problem Relation Age of Onset   Heart attack Mother    Heart attack Father    Heart attack Paternal Grandmother    Skin cancer Paternal Grandmother     Social History   Socioeconomic History   Marital status: Married    Spouse name: Not on file   Number of children: 2   Years of education: Not on file   Highest education level: Associate degree: occupational, Scientist, product/process development, or vocational program  Occupational History   Occupation: Retired  Tobacco Use   Smoking status: Former    Packs/day: 0.50    Years: 4.00    Additional pack years: 0.00    Total pack years: 2.00    Types: Cigarettes   Smokeless tobacco: Never   Tobacco comments:    1979  Vaping  Use   Vaping Use: Never used  Substance and Sexual Activity   Alcohol use: No   Drug use: No   Sexual activity: Not on file  Other Topics Concern   Not on file  Social History Narrative   Not on file   Social Determinants of Health   Financial Resource Strain: Low Risk  (06/10/2022)   Overall Financial Resource Strain (CARDIA)    Difficulty of Paying Living Expenses: Not hard at all  Food Insecurity: No Food Insecurity (06/10/2022)   Hunger Vital Sign    Worried About Running Out of Food in the Last Year: Never true    Ran Out of Food in the Last Year: Never true  Transportation Needs: No Transportation Needs (06/10/2022)   PRAPARE - Administrator, Civil Service (Medical): No    Lack of Transportation (Non-Medical): No  Physical Activity: Insufficiently Active (06/10/2022)   Exercise Vital Sign    Days of Exercise per Week: 2 days    Minutes of Exercise per Session: 20 min  Stress: No Stress Concern  Present (06/10/2022)   [employer] of Occupational Health - Occupational Stress Questionnaire    Feeling of Stress : Only a little  Social Connections: Moderately Isolated (06/10/2022)   Social Connection and Isolation Panel [NHANES]    Frequency of Communication with Friends and Family: Once a week    Frequency of Social Gatherings with Friends and Family: Three times a week    Attends Religious Services: Never    Active Member of Clubs or Organizations: No    Attends Banker Meetings: Never    Marital Status: Married  Catering manager Violence: Not At Risk (06/10/2022)   Humiliation, Afraid, Rape, and Kick questionnaire    Fear of Current or Ex-Partner: No    Emotionally Abused: No    Physically Abused: No    Sexually Abused: No     Constitutional: Denies fever, malaise, fatigue, headache or abrupt weight changes.  Respiratory: Denies difficulty breathing, shortness of breath, cough or sputum production.   Cardiovascular: Denies chest pain, chest tightness, palpitations or swelling in the hands or feet.  Skin: Patient reports cat scratch to bilateral hands.  Denies redness, rashes, lesions or ulcercations.  Neurological: Patient reports difficulty with memory.  Denies dizziness, difficulty with speech or problems with balance and coordination.    No other specific complaints in a complete review of systems (except as listed in HPI above).  Objective:   Physical Exam  BP 124/64 (BP Location: Right Arm, Patient Position: Sitting, Cuff Size: Normal)   Pulse 71   Temp (!) 96.8 F (36 C) (Temporal)   SpO2 96%   Wt Readings from Last 3 Encounters:  08/18/22 172 lb (78 kg)  06/10/22 172 lb (78 kg)  12/28/21 172 lb (78 kg)    General: Appears her stated age, in NAD. Skin: Multiple scabbed scratches noted to bilateral hands with slight surrounding redness but no erythema. Cardiovascular: Normal rate. Pulmonary/Chest: Normal effort and positive vesicular  breath sounds. No respiratory distress. No wheezes, rales or ronchi noted.  Neurological: Alert.   BMET    Component Value Date/Time   NA 145 09/16/2022 1342   NA 145 (H) 09/08/2020 1620   NA 139 07/30/2014 0633   K 3.9 09/16/2022 1342   K 4.0 07/30/2014 0633   CL 107 09/16/2022 1342   CL 104 07/30/2014 0633   CO2 30 09/16/2022 1342   CO2 30 07/30/2014 0633   GLUCOSE  77 09/16/2022 1342   GLUCOSE 101 (H) 07/30/2014 0633   BUN 14 09/16/2022 1342   BUN 16 09/08/2020 1620   BUN 8 07/30/2014 0633   CREATININE 0.95 09/16/2022 1342   CALCIUM 9.3 09/16/2022 1342   CALCIUM 8.5 07/30/2014 0633   GFRNONAA 53 (L) 09/08/2020 1620   GFRNONAA 61 01/03/2020 1130   GFRAA 61 09/08/2020 1620   GFRAA 71 01/03/2020 1130    Lipid Panel     Component Value Date/Time   CHOL 172 09/16/2022 1342   CHOL 206 (H) 01/10/2018 1228   TRIG 182 (H) 09/16/2022 1342   HDL 37 (L) 09/16/2022 1342   HDL 35 (L) 01/10/2018 1228   CHOLHDL 4.6 09/16/2022 1342   LDLCALC 105 (H) 09/16/2022 1342    CBC    Component Value Date/Time   WBC 4.0 09/16/2022 1342   RBC 4.23 09/16/2022 1342   HGB 12.9 09/16/2022 1342   HGB 13.1 01/10/2018 1228   HCT 37.8 09/16/2022 1342   HCT 39.1 01/10/2018 1228   PLT 139 (L) 09/16/2022 1342   PLT 199 01/10/2018 1228   MCV 89.4 09/16/2022 1342   MCV 87 01/10/2018 1228   MCV 89 07/16/2014 0951   MCH 30.5 09/16/2022 1342   MCHC 34.1 09/16/2022 1342   RDW 12.3 09/16/2022 1342   RDW 12.8 01/10/2018 1228   RDW 13.3 07/16/2014 0951   LYMPHSABS 2,321 01/03/2020 1130   LYMPHSABS 1.9 10/20/2015 1152   EOSABS 123 01/03/2020 1130   EOSABS 0.1 10/20/2015 1152   BASOSABS 33 01/03/2020 1130   BASOSABS 0.0 10/20/2015 1152    Hgb A1C Lab Results  Component Value Date   HGBA1C 5.4 09/16/2022            Assessment & Plan:   Cat Scratch of Hand, Infected:  Given her age, will treat with Augmentin 875-125 mg twice daily x 10 days Tdap today Monitor for increased  redness, swelling or purulent discharge  RTC in 3 months for follow-up of chronic conditions Nicki Reaper, NP

## 2022-12-12 NOTE — Patient Instructions (Signed)
Clean Catch Urine Collection The clean catch urine collection method is a way of collecting a urine sample for lab testing. Using a clean catch method reduces the chance that bacteria and other fluids from the genital area will be collected in the urine sample. Many tests may be performed on the urine sample to help you and your health care provider determine appropriate treatment for your condition. The collection method includes: Cleaning your genital area. Collecting midstream urine. It is important to catch the middle part of the urine flow. Securing the sample for lab testing. General tips If you are asked to collect a urine sample at home, make sure you: Use supplies and instructions that you received from the lab. Collect urine only in the germ-free (sterile) cup that you received from the lab. Do not let any toilet paper or stool (feces) get into the cup. Refrigerate the sample until you can return it to the lab. It is up to you to get the results of your test. Ask your health care provider, or the department that is doing the test, when your results will be ready. Supplies needed: You will need the following supplies: Soap and water. Cleansing wipes. Collection container. How to collect a clean catch urine sample  The clean catch collection method varies slightly for women, men, and infants. Females Wash your hands with soap and water for at least 20 seconds. Place the cleansing wipes and collection container within reach. Open the collection container and place the lid with the flat side down. Be careful not to touch the inside of the lid. Spread your legs open and separate the inner folds of skin at the entrance of the vagina (labia minora). Clean your genital area: Take the first cleansing wipe and wipe from front to back inside your labia. Throw the cleansing wipe away. Take the second cleansing wipe and clean around the middle area of your genitals. Continue to hold your  labial skin folds open while collecting the sample: Urinate a small amount into the toilet, then stop the flow. Hold the collection container under your genital area. Urinate into the collection container until it is about half full, then stop. Set the collection container down. Let go of your labial folds and finish urinating into the toilet. Secure the lid onto the collection container. Avoid touching the inside of the lid and collection container. Wash your hands with soap and water for at least 20 seconds. Label the collection container as told. If you are at home, keep the sample refrigerated until you take it to the lab. Males Wash your hands with soap and water for at least 20 seconds. Place the cleansing wipes and collection container within reach. Open the collection container and place the lid with the flat side down. Be careful not to touch the inside of the lid. Clean your genital area: Take a cleansing wipe and clean all around the tip of your penis. Make sure the foreskin is pulled back away from the opening, if necessary. Throw the cleansing wipe away. Collect the sample: Urinate a small amount into the toilet, then stop the flow. Hold the collection container close to the opening of your penis. Urinate into the collection container until it is about half full, then stop. Set the collection container down. Finish urinating into the toilet. Secure the lid onto the collection container. Avoid touching the inside of the lid and collection container. Wash your hands with soap and water for at least 20 seconds. Label   the collection container as directed. If you are at home, keep the sample refrigerated until you take it to the lab. Infants Wash your hands with soap and water for at least 20 seconds. Open the collection bag. Wash your infant's genital area with cleansing wipes. Do not apply any ointments or antiseptics immediately before cleansing. Place the collection bag  over the penis or labia. Attach the adhesive to your infant's skin. Place a new diaper on your infant over the collection bag. Wash your hands with soap and water for at least 20 seconds. Monitor your baby. Remove the collection bag after your infant has urinated. You may need to transfer the urine into a collection container. Label the collection container as directed. If you are at home, keep the sample refrigerated until you take it to the lab. Summary The clean catch urine collection method is a way of collecting a urine sample for lab testing. Using a clean catch method reduces the chance that bacteria and other fluids from the genital area will be collected in the urine sample. The clean catch collection method varies slightly for women, men, and infants. This information is not intended to replace advice given to you by your health care provider. Make sure you discuss any questions you have with your health care provider. Document Revised: 04/30/2020 Document Reviewed: 02/28/2020 Elsevier Patient Education  2023 Elsevier Inc.  

## 2023-02-06 ENCOUNTER — Ambulatory Visit: Payer: Self-pay | Admitting: *Deleted

## 2023-02-06 ENCOUNTER — Ambulatory Visit: Admission: RE | Admit: 2023-02-06 | Payer: Medicare PPO | Source: Ambulatory Visit

## 2023-02-06 ENCOUNTER — Ambulatory Visit
Admission: RE | Admit: 2023-02-06 | Discharge: 2023-02-06 | Disposition: A | Discharge status: Home / Self Care | Payer: Medicare PPO | Attending: Internal Medicine | Admitting: Internal Medicine

## 2023-02-06 ENCOUNTER — Ambulatory Visit: Payer: Medicare PPO | Admitting: Internal Medicine

## 2023-02-06 ENCOUNTER — Ambulatory Visit (INDEPENDENT_AMBULATORY_CARE_PROVIDER_SITE_OTHER): Payer: Medicare PPO | Admitting: Internal Medicine

## 2023-02-06 ENCOUNTER — Encounter: Payer: Self-pay | Admitting: Internal Medicine

## 2023-02-06 VITALS — BP 124/72 | HR 78 | Temp 97.1°F

## 2023-02-06 DIAGNOSIS — M7989 Other specified soft tissue disorders: Secondary | ICD-10-CM | POA: Insufficient documentation

## 2023-02-06 DIAGNOSIS — S40022A Contusion of left upper arm, initial encounter: Secondary | ICD-10-CM

## 2023-02-06 DIAGNOSIS — M79632 Pain in left forearm: Secondary | ICD-10-CM | POA: Diagnosis not present

## 2023-02-06 DIAGNOSIS — W19XXXA Unspecified fall, initial encounter: Secondary | ICD-10-CM

## 2023-02-06 NOTE — Patient Instructions (Signed)
RICE Therapy for Routine Care of Injuries The routine care of many injuries includes rest, ice, compression, and elevation (RICE therapy). RICE therapy is often recommended for injuries to soft tissues, such as muscle strain, sprains, bruises, and overuse injuries. It can also be used for some bone injuries. Using RICE therapy can help to relieve pain and lessen swelling. Supplies needed: Ice. Plastic bag. Towel. Elastic bandage. Pillow or pillows to raise (elevate) the injured body part. How to care for your injury with RICE therapy  Rest Rest your injury. This may help with the healing process. Rest usually involves limiting your normal activities and not using the injured part of your body. Generally, you can return to your normal activities when your health care provider says it is okay and you can do them without much discomfort. If you rest the injury too much, it may not heal as well. Some injuries heal better with early movement instead of resting for too long. Talk with your health care provider about how you should limit your activities and whether you should start range-of-motion exercises for your injury. Ice Ice your injury to lessen swelling and pain. Do not apply ice directly to your skin. Put ice in a plastic bag. Place a towel between your skin and the bag. Leave the ice on for 20 minutes, 2-3 times a day. Use ice on as many days as told by your health care provider. Compression Put pressure (compression) on your injured area to control swelling, give support, and help with discomfort. Compression may be done with an elastic bandage. If an elastic bandage has been applied, follow these general tips: Use the bandage as directed by the maker of the bandage that you are using. Do not wrap the bandage too tightly. That may block (cut off) circulation in the arm or leg in the area below the bandage. If part of your body beyond the bandage becomes blue, numb, cold, swollen, or more  painful, your bandage is probably too tight. If this occurs, remove your bandage and reapply it more loosely. Remove and reapply the bandage every 3-4 hours or as told by your health care provider. See your health care provider if the bandage seems to be making your problems worse rather than better. Elevation Elevate your injured area to lessen swelling and pain. If possible, elevate your injured area at or above the level of your heart or the center of your chest. Contact a health care provider if: Your pain and swelling continue. Your symptoms are getting worse rather than improving. Having these problems may mean that you need further evaluation or imaging tests, such as X-rays or an MRI. Sometimes, X-rays may not show a small broken bone (fracture) until days after the injury happened. Make a follow-up appointment with your health care provider. Ask your health care provider, or the department that is doing the imaging test, when your results will be ready. Get help right away if: You have sudden severe pain at or below the area of your injury. You have redness or increased swelling around your injury. You have tingling or numbness at or below the area of your injury and it does not improve after you remove the elastic bandage. Summary The routine care of many injuries includes rest, ice, compression, and elevation (RICE therapy). Using RICE therapy can help to relieve pain and lessen swelling. RICE therapy is often recommended for injuries to soft tissues, such as muscle strain, sprains, bruises, and overuse injuries. It can also   be used for some bone injuries. Seek medical care if your pain and swelling continue or if your symptoms are getting worse rather than improving. This information is not intended to replace advice given to you by your health care provider. Make sure you discuss any questions you have with your health care provider. Document Revised: 09/23/2019 Document Reviewed:  04/07/2017 Elsevier Patient Education  2021 Elsevier Inc.  

## 2023-02-06 NOTE — Telephone Encounter (Signed)
  Chief Complaint: Arm Swelling Symptoms: Left arm swelling S/P fall 02/02/23. Mild from elbow to hand, tender to touch at elbow and "Some warmth." Frequency: Swelling started yesterday Pertinent Negatives: Patient denies redness, no lacerations Disposition: [] ED /[] Urgent Care (no appt availability in office) / [x] Appointment(In office/virtual)/ []  [place] [facility]/ [] Home Care/ [] Refused Recommended Disposition /[] [place] [facility]/ []  Follow-up with PCP Additional Notes: Son 'Chase' requesting Xray, advised would need appt, no availability. Called practice, [facility], for consult.  Pt can be seen this AM, call was dropped on pt's end. Advised [facility] would attempt to CB to advise of appt.  Reason for Disposition  [1] Caller has URGENT question AND [2] triager unable to answer question  Answer Assessment - Initial Assessment Questions 1. MECHANISM: "How did the fall happen?"     Tripped and fell 2. DOMESTIC VIOLENCE AND ELDER ABUSE SCREENING: "Did you fall because someone pushed you or tried to hurt you?" If Yes, ask: "Are you safe now?"     no 3. ONSET: "When did the fall happen?" (e.g., minutes, hours, or days ago)     02/02/23 4. LOCATION: "What part of the body hit the ground?" (e.g., back, buttocks, head, hips, knees, hands, head, stomach)     Arm swelling, tenderness, left arm 5. INJURY: "Did you hurt (injure) yourself when you fell?" If Yes, ask: "What did you injure? Tell me more about this?" (e.g., body area; type of injury; pain severity)"     Leaned on wall and fell, hit elbow 6. PAIN: "Is there any pain?" If Yes, ask: "How bad is the pain?" (e.g., Scale 1-10; or mild,  moderate, severe)   - NONE (0): No pain   - MILD (1-3): Doesn't interfere with normal activities    - MODERATE (4-7): Interferes with normal activities or awakens from sleep    - SEVERE (8-10): Excruciating pain, unable to do any normal activities      Tender to touch, warmth 7. SIZE: For cuts, bruises,  or swelling, ask: "How large is it?" (e.g., inches or centimeters)      no  9. OTHER SYMPTOMS: "Do you have any other symptoms?" (e.g., dizziness, fever, weakness; new onset or worsening).       10. CAUSE: "What do you think caused the fall (or falling)?" (e.g., tripped, dizzy spell)       Tripped  Protocols used: Falls and [facility]

## 2023-02-06 NOTE — Progress Notes (Signed)
Subjective:    Patient ID: Susan Campos, female    DOB: 1933/03/01, 89 y.o.   MRN: [id number]  HPI  Patient presents to clinic today with complaint of left arm pain and swelling.  This occurred after a fall on Thursday. She is unable to describe the pain. Her son reports it was swollen and bruised. She has been able to move her left arm. They have elevated the left arm to help reduce the swelling.   Review of Systems     Past Medical History:  Diagnosis Date   Arthritis    HIPS   Asthma    HX OF   Cancer (HCC)    skin   Hypertension    Orthopnea    sleeps in recliner, cannot lay flat on her back   Stroke [facility])    CVA 2012/ MEMORY LOSS, 1999, 1 more in 2011   Vertigo    OCCASIONAL    Current Outpatient Medications  Medication Sig Dispense Refill   acetaminophen (TYLENOL) 650 MG CR tablet Take by mouth every 8 (eight) hours as needed.      albuterol (VENTOLIN HFA) 108 (90 Base) MCG/ACT inhaler INHALE 2 PUFFS INTO THE LUNGS EVERY 6 HOURS AS NEEDED FOR WHEEZING OR SHORTNESS OF BREATH 54 g 1   amoxicillin-clavulanate (AUGMENTIN) 875-125 MG tablet Take 1 tablet by mouth 2 (two) times daily. 20 tablet 0   aspirin EC 81 MG tablet Take 81 mg by mouth daily. 9 am     calcium carbonate (TUMS - DOSED IN MG ELEMENTAL CALCIUM) 500 MG chewable tablet Chew by mouth as needed.     cholecalciferol (VITAMIN D3) 25 MCG (1000 UNIT) tablet Take 1,000 Units by mouth daily.     furosemide (LASIX) 20 MG tablet TAKE 1 TABLET(20 MG) BY MOUTH DAILY 90 tablet 0   lisinopril (ZESTRIL) 10 MG tablet TAKE 1 TABLET(10 MG) BY MOUTH DAILY 90 tablet 0   Multiple Vitamin (MULTIVITAMIN) capsule Take 1 capsule by mouth daily. 9 AM     nystatin (MYCOSTATIN/NYSTOP) powder Apply 1 Application topically 3 (three) times daily. 60 g 0   sertraline (ZOLOFT) 50 MG tablet Take 1 tablet (50 mg total) by mouth daily. 90 tablet 0   simvastatin (ZOCOR) 20 MG tablet TAKE 1 TABLET(20 MG) BY MOUTH DAILY 90 tablet 0    traZODone (DESYREL) 50 MG tablet TAKE 1/2 TO 1 TABLET(25 TO 50 MG) BY MOUTH AT BEDTIME AS NEEDED FOR SLEEP 90 tablet 0   No current facility-administered medications for this visit.    Allergies  Allergen Reactions   Lovastatin Shortness Of Breath and Palpitations   Aricept [Donepezil Hcl]     dizziness    Family History  Problem Relation Age of Onset   Heart attack Mother    Heart attack Father    Heart attack Paternal Grandmother    Skin cancer Paternal Grandmother     Social History   Socioeconomic History   Marital status: Married    Spouse name: Not on file   Number of children: 2   Years of education: Not on file   Highest education level: Associate degree: occupational, Scientist, product/process development, or vocational program  Occupational History   Occupation: Retired  Tobacco Use   Smoking status: Former    Packs/day: 0.50    Years: 4.00    Additional pack years: 0.00    Total pack years: 2.00    Types: Cigarettes   Smokeless tobacco: Never   Tobacco comments:  1979  Vaping Use   Vaping Use: Never used  Substance and Sexual Activity   Alcohol use: No   Drug use: No   Sexual activity: Not on file  Other Topics Concern   Not on file  Social History Narrative   Not on file   Social Determinants of Health   Financial Resource Strain: Low Risk  (06/10/2022)   Overall Financial Resource Strain (CARDIA)    Difficulty of Paying Living Expenses: Not hard at all  Food Insecurity: No Food Insecurity (06/10/2022)   Hunger Vital Sign    Worried About Running Out of Food in the Last Year: Never true    Ran Out of Food in the Last Year: Never true  Transportation Needs: No Transportation Needs (06/10/2022)   PRAPARE - Administrator, Civil Service (Medical): No    Lack of Transportation (Non-Medical): No  Physical Activity: Insufficiently Active (06/10/2022)   Exercise Vital Sign    Days of Exercise per Week: 2 days    Minutes of Exercise per Session: 20 min   Stress: No Stress Concern Present (06/10/2022)   [employer] of Occupational Health - Occupational Stress Questionnaire    Feeling of Stress : Only a little  Social Connections: Moderately Isolated (06/10/2022)   Social Connection and Isolation Panel [NHANES]    Frequency of Communication with Friends and Family: Once a week    Frequency of Social Gatherings with Friends and Family: Three times a week    Attends Religious Services: Never    Active Member of Clubs or Organizations: No    Attends Banker Meetings: Never    Marital Status: Married  Catering manager Violence: Not At Risk (06/10/2022)   Humiliation, Afraid, Rape, and Kick questionnaire    Fear of Current or Ex-Partner: No    Emotionally Abused: No    Physically Abused: No    Sexually Abused: No     Constitutional: Denies fever, malaise, fatigue, headache or abrupt weight changes.  Respiratory: Denies difficulty breathing, shortness of breath, cough or sputum production.   Cardiovascular: Denies chest pain, chest tightness, palpitations or swelling in the hands or feet.  Gastrointestinal: Denies abdominal pain, bloating, constipation, diarrhea or blood in the stool.  Musculoskeletal: Patient reports left forearm pain and swelling, decrease in range of motion.  Denies muscle pain.  Skin: Denies redness, rashes, lesions or ulcercations.  Neurological: Patient has difficulty with memory.  Denies dizziness, difficulty with speech or problems with balance and coordination.    No other specific complaints in a complete review of systems (except as listed in HPI above).  Objective:   Physical Exam  BP 124/72 (BP Location: Right Arm, Patient Position: Sitting, Cuff Size: Normal)   Pulse 78   Temp (!) 97.1 F (36.2 C) (Temporal)   SpO2 100%   Wt Readings from Last 3 Encounters:  08/18/22 172 lb (78 kg)  06/10/22 172 lb (78 kg)  12/28/21 172 lb (78 kg)    General: Appears her stated age, obese in  NAD. Skin: Warm, dry and intact.  Bruising noted overlying the left proximal ulna. Cardiovascular: Normal rate and rhythm.  Radial pulse 2+ on the left. Pulmonary/Chest: Normal effort and positive vesicular breath sounds. No respiratory distress. No wheezes, rales or ronchi noted.  Musculoskeletal: Normal flexion of the left elbow.  Unable to fully extend the left elbow.  Pain with palpation over the mid to proximal ulna.  Handgrips equal.  In wheelchair today. Neurological: Alert.  BMET    Component Value Date/Time   NA 145 09/16/2022 1342   NA 145 (H) 09/08/2020 1620   NA 139 07/30/2014 0633   K 3.9 09/16/2022 1342   K 4.0 07/30/2014 0633   CL 107 09/16/2022 1342   CL 104 07/30/2014 0633   CO2 30 09/16/2022 1342   CO2 30 07/30/2014 0633   GLUCOSE 77 09/16/2022 1342   GLUCOSE 101 (H) 07/30/2014 0633   BUN 14 09/16/2022 1342   BUN 16 09/08/2020 1620   BUN 8 07/30/2014 0633   CREATININE 0.95 09/16/2022 1342   CALCIUM 9.3 09/16/2022 1342   CALCIUM 8.5 07/30/2014 0633   GFRNONAA 53 (L) 09/08/2020 1620   GFRNONAA 61 01/03/2020 1130   GFRAA 61 09/08/2020 1620   GFRAA 71 01/03/2020 1130    Lipid Panel     Component Value Date/Time   CHOL 172 09/16/2022 1342   CHOL 206 (H) 01/10/2018 1228   TRIG 182 (H) 09/16/2022 1342   HDL 37 (L) 09/16/2022 1342   HDL 35 (L) 01/10/2018 1228   CHOLHDL 4.6 09/16/2022 1342   LDLCALC 105 (H) 09/16/2022 1342    CBC    Component Value Date/Time   WBC 4.0 09/16/2022 1342   RBC 4.23 09/16/2022 1342   HGB 12.9 09/16/2022 1342   HGB 13.1 01/10/2018 1228   HCT 37.8 09/16/2022 1342   HCT 39.1 01/10/2018 1228   PLT 139 (L) 09/16/2022 1342   PLT 199 01/10/2018 1228   MCV 89.4 09/16/2022 1342   MCV 87 01/10/2018 1228   MCV 89 07/16/2014 0951   MCH 30.5 09/16/2022 1342   MCHC 34.1 09/16/2022 1342   RDW 12.3 09/16/2022 1342   RDW 12.8 01/10/2018 1228   RDW 13.3 07/16/2014 0951   LYMPHSABS 2,321 01/03/2020 1130   LYMPHSABS 1.9 10/20/2015  1152   EOSABS 123 01/03/2020 1130   EOSABS 0.1 10/20/2015 1152   BASOSABS 33 01/03/2020 1130   BASOSABS 0.0 10/20/2015 1152    Hgb A1C Lab Results  Component Value Date   HGBA1C 5.4 09/16/2022            Assessment & Plan:   Left forearm pain, swelling and bruising s/p fall:  X-ray left forearm today Encourage continued elevation to help reduce swelling Okay to give her Tylenol 1000 mg every 8 hours as needed  Will call with x-ray results with further recommendation and treatment plan   RTC in 1 month for follow-up of chronic conditions Nicki Reaper, NP

## 2023-03-07 ENCOUNTER — Other Ambulatory Visit: Payer: Self-pay | Admitting: Internal Medicine

## 2023-03-07 DIAGNOSIS — W5503XA Scratched by cat, initial encounter: Secondary | ICD-10-CM

## 2023-03-07 NOTE — Telephone Encounter (Signed)
Requested Prescriptions  Pending Prescriptions Disp Refills   sertraline (ZOLOFT) 50 MG tablet [Pharmacy Med Name: SERTRALINE 50MG  TABLETS] 90 tablet 0    Sig: TAKE 1 TABLET(50 MG) BY MOUTH DAILY     Psychiatry:  Antidepressants - SSRI - sertraline Passed - 03/07/2023  7:02 AM      Passed - AST in normal range and within 360 days    AST  Date Value Ref Range Status  09/16/2022 11 10 - 35 U/L Final   SGOT(AST)  Date Value Ref Range Status  10/19/2011 24 15 - 37 Unit/L Final         Passed - ALT in normal range and within 360 days    ALT  Date Value Ref Range Status  09/16/2022 8 6 - 29 U/L Final   SGPT (ALT)  Date Value Ref Range Status  10/19/2011 27 U/L Final    Comment:    12-78 NOTE: NEW REFERENCE RANGE 06/24/2011          Passed - Completed PHQ-2 or PHQ-9 in the last 360 days      Passed - Valid encounter within last 6 months    Recent Outpatient Visits           4 weeks ago Pain and swelling of forearm, left   [place] [facility] [place], Salvadore Oxford, NP   2 months ago Cat scratch of hand, unspecified laterality, initial encounter   [place] [facility] [place], Salvadore Oxford, NP   5 months ago Encounter for general adult medical examination with abnormal findings   [facility] [place], Salvadore Oxford, NP   6 months ago Yeast vaginitis   [place] [facility] [place] Cords, DO   10 months ago Hypercholesteremia   [place] [facility] [place], Salvadore Oxford, NP       Future Appointments             In 1 week Sampson Si, Salvadore Oxford, NP [place] [facility], [facility]

## 2023-03-14 ENCOUNTER — Ambulatory Visit: Payer: Medicare PPO | Admitting: Internal Medicine

## 2023-03-14 NOTE — Progress Notes (Deleted)
Subjective:    Patient ID: Susan Campos, female    DOB: Jul 09, 1933, 89 y.o.   MRN: [id number]  HPI  Patient presents to clinic today for 73-month follow-up of chronic conditions.  HTN: Her BP today is.  She is taking lisinopril and furosemide as prescribed.  ECG from 11/2016 reviewed.  HLD with history of stroke: Her last LDL was 105, triglycerides 182, 09/2022.  She denies myalgias on simvastatin.  She is taking aspirin as well.  She tries to consume a low-fat diet.  GERD: She is not sure what triggers it is.  She is not taking any medications for this.  There is no upper GI on file.  OA: Mainly in her knees.  She takes tylenol as needed with good relief of symptoms.  She does not follow with orthopedics.  Osteopenia: She is taking calcium and vitamin D OTC.  She does not get weightbearing exercise.  Bone density from 07/2014 reviewed.  Alzheimer's: She reports mild cognitive and stable functional needs.  She is not currently taking medications for this.  MRI brain from 04/2016 reviewed.  She does not follow with neurology.  Depression: Chronic, managed on sertraline.  She is not currently seeing a therapist.  She denies anxiety, SI/HI.  Insomnia: She has difficulty.  She is taking trazodone as prescribed.  There is no sleep study on file.  Review of Systems     Past Medical History:  Diagnosis Date   Arthritis    HIPS   Asthma    HX OF   Cancer (HCC)    skin   Hypertension    Orthopnea    sleeps in recliner, cannot lay flat on her back   Stroke [facility])    CVA 2012/ MEMORY LOSS, 1999, 1 more in 2011   Vertigo    OCCASIONAL    Current Outpatient Medications  Medication Sig Dispense Refill   acetaminophen (TYLENOL) 650 MG CR tablet Take by mouth every 8 (eight) hours as needed.      albuterol (VENTOLIN HFA) 108 (90 Base) MCG/ACT inhaler INHALE 2 PUFFS INTO THE LUNGS EVERY 6 HOURS AS NEEDED FOR WHEEZING OR SHORTNESS OF BREATH 54 g 1   amoxicillin-clavulanate  (AUGMENTIN) 875-125 MG tablet Take 1 tablet by mouth 2 (two) times daily. 20 tablet 0   aspirin EC 81 MG tablet Take 81 mg by mouth daily. 9 am     calcium carbonate (TUMS - DOSED IN MG ELEMENTAL CALCIUM) 500 MG chewable tablet Chew by mouth as needed.     cholecalciferol (VITAMIN D3) 25 MCG (1000 UNIT) tablet Take 1,000 Units by mouth daily.     furosemide (LASIX) 20 MG tablet TAKE 1 TABLET(20 MG) BY MOUTH DAILY 90 tablet 0   lisinopril (ZESTRIL) 10 MG tablet TAKE 1 TABLET(10 MG) BY MOUTH DAILY 90 tablet 0   Multiple Vitamin (MULTIVITAMIN) capsule Take 1 capsule by mouth daily. 9 AM     nystatin (MYCOSTATIN/NYSTOP) powder Apply 1 Application topically 3 (three) times daily. 60 g 0   sertraline (ZOLOFT) 50 MG tablet TAKE 1 TABLET(50 MG) BY MOUTH DAILY 90 tablet 0   simvastatin (ZOCOR) 20 MG tablet TAKE 1 TABLET(20 MG) BY MOUTH DAILY 90 tablet 0   traZODone (DESYREL) 50 MG tablet TAKE 1/2 TO 1 TABLET(25 TO 50 MG) BY MOUTH AT BEDTIME AS NEEDED FOR SLEEP 90 tablet 0   No current facility-administered medications for this visit.    Allergies  Allergen Reactions   Lovastatin Shortness Of  Breath and Palpitations   Aricept [Donepezil Hcl]     dizziness    Family History  Problem Relation Age of Onset   Heart attack Mother    Heart attack Father    Heart attack Paternal Grandmother    Skin cancer Paternal Grandmother     Social History   Socioeconomic History   Marital status: Married    Spouse name: Not on file   Number of children: 2   Years of education: Not on file   Highest education level: Associate degree: occupational, Scientist, product/process development, or vocational program  Occupational History   Occupation: Retired  Tobacco Use   Smoking status: Former    Current packs/day: 0.50    Average packs/day: 0.5 packs/day for 4.0 years (2.0 ttl pk-yrs)    Types: Cigarettes   Smokeless tobacco: Never   Tobacco comments:    1979  Vaping Use   Vaping status: Never Used  Substance and Sexual  Activity   Alcohol use: No   Drug use: No   Sexual activity: Not on file  Other Topics Concern   Not on file  Social History Narrative   Not on file   Social Determinants of Health   Financial Resource Strain: Low Risk  (06/10/2022)   Overall Financial Resource Strain (CARDIA)    Difficulty of Paying Living Expenses: Not hard at all  Food Insecurity: No Food Insecurity (06/10/2022)   Hunger Vital Sign    Worried About Running Out of Food in the Last Year: Never true    Ran Out of Food in the Last Year: Never true  Transportation Needs: No Transportation Needs (06/10/2022)   PRAPARE - Administrator, Civil Service (Medical): No    Lack of Transportation (Non-Medical): No  Physical Activity: Insufficiently Active (06/10/2022)   Exercise Vital Sign    Days of Exercise per Week: 2 days    Minutes of Exercise per Session: 20 min  Stress: No Stress Concern Present (06/10/2022)   [employer] of Occupational Health - Occupational Stress Questionnaire    Feeling of Stress : Only a little  Social Connections: Moderately Isolated (06/10/2022)   Social Connection and Isolation Panel [NHANES]    Frequency of Communication with Friends and Family: Once a week    Frequency of Social Gatherings with Friends and Family: Three times a week    Attends Religious Services: Never    Active Member of Clubs or Organizations: No    Attends Banker Meetings: Never    Marital Status: Married  Catering manager Violence: Not At Risk (06/10/2022)   Humiliation, Afraid, Rape, and Kick questionnaire    Fear of Current or Ex-Partner: No    Emotionally Abused: No    Physically Abused: No    Sexually Abused: No     Constitutional: Denies fever, malaise, fatigue, headache or abrupt weight changes.  HEENT: Denies eye pain, eye redness, ear pain, ringing in the ears, wax buildup, runny nose, nasal congestion, bloody nose, or sore throat. Respiratory: Denies difficulty  breathing, shortness of breath, cough or sputum production.   Cardiovascular: Denies chest pain, chest tightness, palpitations or swelling in the hands or feet.  Gastrointestinal: Patient reports intermittent reflux.  Denies abdominal pain, bloating, constipation, diarrhea or blood in the stool.  GU: Denies urgency, frequency, pain with urination, burning sensation, blood in urine, odor or discharge. Musculoskeletal: Patient reports joint pain.  Denies decrease in range of motion, difficulty with gait, muscle pain or joint swelling.  Skin:  Denies redness, rashes, lesions or ulcercations.  Neurological: Patient reports insomnia, difficulty with memory.  Denies dizziness, difficulty with speech or problems with balance and coordination.  Psych: Patient has a history of depression.  Denies anxiety, SI/HI.  No other specific complaints in a complete review of systems (except as listed in HPI above).  Objective:   Physical Exam   There were no vitals taken for this visit. Wt Readings from Last 3 Encounters:  08/18/22 172 lb (78 kg)  06/10/22 172 lb (78 kg)  12/28/21 172 lb (78 kg)    General: Appears their stated age, well developed, well nourished in NAD. Skin: Warm, dry and intact. No rashes, lesions or ulcerations noted. HEENT: Head: normal shape and size; Eyes: sclera white, no icterus, conjunctiva pink, PERRLA and EOMs intact; Ears: Tm's gray and intact, normal light reflex; Nose: mucosa pink and moist, septum midline; Throat/Mouth: Teeth present, mucosa pink and moist, no exudate, lesions or ulcerations noted.  Neck:  Neck supple, trachea midline. No masses, lumps or thyromegaly present.  Cardiovascular: Normal rate and rhythm. S1,S2 noted.  No murmur, rubs or gallops noted. No JVD or BLE edema. No carotid bruits noted. Pulmonary/Chest: Normal effort and positive vesicular breath sounds. No respiratory distress. No wheezes, rales or ronchi noted.  Abdomen: Soft and nontender. Normal  bowel sounds. No distention or masses noted. Liver, spleen and kidneys non palpable. Musculoskeletal: Normal range of motion. No signs of joint swelling. No difficulty with gait.  Neurological: Alert and oriented. Cranial nerves II-XII grossly intact. Coordination normal.  Psychiatric: Mood and affect normal. Behavior is normal. Judgment and thought content normal.    BMET    Component Value Date/Time   NA 145 09/16/2022 1342   NA 145 (H) 09/08/2020 1620   NA 139 07/30/2014 0633   K 3.9 09/16/2022 1342   K 4.0 07/30/2014 0633   CL 107 09/16/2022 1342   CL 104 07/30/2014 0633   CO2 30 09/16/2022 1342   CO2 30 07/30/2014 0633   GLUCOSE 77 09/16/2022 1342   GLUCOSE 101 (H) 07/30/2014 0633   BUN 14 09/16/2022 1342   BUN 16 09/08/2020 1620   BUN 8 07/30/2014 0633   CREATININE 0.95 09/16/2022 1342   CALCIUM 9.3 09/16/2022 1342   CALCIUM 8.5 07/30/2014 0633   GFRNONAA 53 (L) 09/08/2020 1620   GFRNONAA 61 01/03/2020 1130   GFRAA 61 09/08/2020 1620   GFRAA 71 01/03/2020 1130    Lipid Panel     Component Value Date/Time   CHOL 172 09/16/2022 1342   CHOL 206 (H) 01/10/2018 1228   TRIG 182 (H) 09/16/2022 1342   HDL 37 (L) 09/16/2022 1342   HDL 35 (L) 01/10/2018 1228   CHOLHDL 4.6 09/16/2022 1342   LDLCALC 105 (H) 09/16/2022 1342    CBC    Component Value Date/Time   WBC 4.0 09/16/2022 1342   RBC 4.23 09/16/2022 1342   HGB 12.9 09/16/2022 1342   HGB 13.1 01/10/2018 1228   HCT 37.8 09/16/2022 1342   HCT 39.1 01/10/2018 1228   PLT 139 (L) 09/16/2022 1342   PLT 199 01/10/2018 1228   MCV 89.4 09/16/2022 1342   MCV 87 01/10/2018 1228   MCV 89 07/16/2014 0951   MCH 30.5 09/16/2022 1342   MCHC 34.1 09/16/2022 1342   RDW 12.3 09/16/2022 1342   RDW 12.8 01/10/2018 1228   RDW 13.3 07/16/2014 0951   LYMPHSABS 2,321 01/03/2020 1130   LYMPHSABS 1.9 10/20/2015 1152   EOSABS 123  01/03/2020 1130   EOSABS 0.1 10/20/2015 1152   BASOSABS 33 01/03/2020 1130   BASOSABS 0.0  10/20/2015 1152    Hgb A1C Lab Results  Component Value Date   HGBA1C 5.4 09/16/2022           Assessment & Plan:     RTC in 6 months for your annual exam Nicki Reaper, NP

## 2023-06-06 ENCOUNTER — Other Ambulatory Visit: Payer: Self-pay | Admitting: Internal Medicine

## 2023-06-06 DIAGNOSIS — S60519A Abrasion of unspecified hand, initial encounter: Secondary | ICD-10-CM

## 2023-06-06 DIAGNOSIS — J4520 Mild intermittent asthma, uncomplicated: Secondary | ICD-10-CM

## 2023-06-06 MED ORDER — SERTRALINE HCL 50 MG PO TABS
50.0000 mg | ORAL_TABLET | Freq: Every day | ORAL | 0 refills | Status: DC
Start: 2023-06-06 — End: 2023-08-14

## 2023-06-06 MED ORDER — LISINOPRIL 10 MG PO TABS
ORAL_TABLET | ORAL | 0 refills | Status: DC
Start: 2023-06-06 — End: 2023-08-14

## 2023-06-06 MED ORDER — ALBUTEROL SULFATE HFA 108 (90 BASE) MCG/ACT IN AERS
INHALATION_SPRAY | RESPIRATORY_TRACT | 0 refills | Status: AC
Start: 2023-06-06 — End: ?

## 2023-06-06 MED ORDER — TRAZODONE HCL 50 MG PO TABS: ORAL_TABLET | ORAL | 0 refills | Status: DC

## 2023-06-06 MED ORDER — SIMVASTATIN 20 MG PO TABS
ORAL_TABLET | ORAL | 0 refills | Status: DC
Start: 2023-06-06 — End: 2023-08-14

## 2023-06-06 MED ORDER — FUROSEMIDE 20 MG PO TABS
ORAL_TABLET | ORAL | 0 refills | Status: DC
Start: 2023-06-06 — End: 2023-08-14

## 2023-06-06 NOTE — Addendum Note (Signed)
Addended by: Judd Gaudier on: 06/06/2023 10:56 AM   Modules accepted: Orders

## 2023-06-06 NOTE — Telephone Encounter (Signed)
Pt son stopped by the office requesting all 7 medication to be  refilled he did not give the name of any  of  the medication  .... [facility]  in  [place]

## 2023-06-06 NOTE — Telephone Encounter (Signed)
Left message for son to let us know specifically what meds need refilled.  Will pend and sent to [facility] for approval.  Patient needs follow up appointment.

## 2023-06-16 ENCOUNTER — Ambulatory Visit (INDEPENDENT_AMBULATORY_CARE_PROVIDER_SITE_OTHER): Payer: Medicare PPO

## 2023-06-16 DIAGNOSIS — Z0000 Encounter for general adult medical examination without abnormal findings: Secondary | ICD-10-CM

## 2023-06-16 NOTE — Progress Notes (Signed)
Subjective:   Susan Campos is a 89 y.o. female who presents for Medicare Annual (Subsequent) preventive examination.  Visit Complete: Virtual I connected with  Susan Campos on 06/16/23 by a audio enabled telemedicine application and verified that I am speaking with the correct person using two identifiers. Spoke w/ son as well  Patient Location: Home  Provider Location: Office/Clinic  I discussed the limitations of evaluation and management by telemedicine. The patient expressed understanding and agreed to proceed.  Vital Signs: Because this visit was a virtual/telehealth visit, some criteria may be missing or patient reported. Any vitals not documented were not able to be obtained and vitals that have been documented are patient reported.  Cardiac Risk Factors include: advanced age (>81men, >55 women);hypertension;sedentary lifestyle     Objective:    There were no vitals filed for this visit. There is no height or weight on file to calculate BMI.     06/16/2023   11:05 AM 06/10/2022    1:29 PM 01/30/2018    1:17 PM 01/27/2017    9:36 AM 06/29/2016   10:37 AM 11/12/2015    8:53 AM 10/20/2015   10:53 AM  Advanced Directives  Does Patient Have a Medical Advance Directive? No No Yes Yes Yes No Yes  Type of Surveyor, minerals;Living will Living will;Healthcare Power of [employer] Power of [place];Living will  Living will  Does patient want to make changes to medical advance directive?     No - Patient declined    Copy of Healthcare Power of Attorney in Chart?   No - copy requested No - copy requested No - copy requested    Would patient like information on creating a medical advance directive? No - Patient declined No - Patient declined    No - patient declined information     Current Medications (verified) Outpatient Encounter Medications as of 06/16/2023  Medication Sig   acetaminophen (TYLENOL) 650 MG CR tablet Take  by mouth every 8 (eight) hours as needed.    albuterol (VENTOLIN HFA) 108 (90 Base) MCG/ACT inhaler INHALE 2 PUFFS INTO THE LUNGS EVERY 6 HOURS AS NEEDED FOR WHEEZING OR SHORTNESS OF BREATH   aspirin EC 81 MG tablet Take 81 mg by mouth daily. 9 am   cholecalciferol (VITAMIN D3) 25 MCG (1000 UNIT) tablet Take 1,000 Units by mouth daily.   furosemide (LASIX) 20 MG tablet TAKE 1 TABLET(20 MG) BY MOUTH DAILY   lisinopril (ZESTRIL) 10 MG tablet TAKE 1 TABLET(10 MG) BY MOUTH DAILY   Multiple Vitamin (MULTIVITAMIN) capsule Take 1 capsule by mouth daily. 9 AM   nystatin (MYCOSTATIN/NYSTOP) powder Apply 1 Application topically 3 (three) times daily.   sertraline (ZOLOFT) 50 MG tablet Take 1 tablet (50 mg total) by mouth daily.   simvastatin (ZOCOR) 20 MG tablet TAKE 1 TABLET(20 MG) BY MOUTH DAILY   traZODone (DESYREL) 50 MG tablet TAKE 1/2 TO 1 TABLET(25 TO 50 MG) BY MOUTH AT BEDTIME AS NEEDED FOR SLEEP   amoxicillin-clavulanate (AUGMENTIN) 875-125 MG tablet Take 1 tablet by mouth 2 (two) times daily. (Patient not taking: Reported on 06/16/2023)   calcium carbonate (TUMS - DOSED IN MG ELEMENTAL CALCIUM) 500 MG chewable tablet Chew by mouth as needed. (Patient not taking: Reported on 06/16/2023)   No facility-administered encounter medications on file as of 06/16/2023.    Allergies (verified) Lovastatin and Aricept [donepezil hcl]   History: Past Medical History:  Diagnosis Date   Arthritis  HIPS   Asthma    HX OF   Cancer (HCC)    skin   Hypertension    Orthopnea    sleeps in recliner, cannot lay flat on her back   Stroke [facility])    CVA 2012/ MEMORY LOSS, 1999, 1 more in 2011   Vertigo    OCCASIONAL   Past Surgical History:  Procedure Laterality Date   CATARACT EXTRACTION Right    CATARACT EXTRACTION W/PHACO Left 05/27/2015   Procedure: CATARACT EXTRACTION PHACO AND INTRAOCULAR LENS PLACEMENT (IOC);  Surgeon: Lockie Mola, MD;  Location: [facility];  Service:  Ophthalmology;  Laterality: Left;   KNEE ARTHROSCOPY     MELANOMA EXCISION     from chest   MOUTH SURGERY     REPLACEMENT TOTAL KNEE BILATERAL     UTERINE SUSPENSION     Family History  Problem Relation Age of Onset   Heart attack Mother    Heart attack Father    Heart attack Paternal Grandmother    Skin cancer Paternal Grandmother    Social History   Socioeconomic History   Marital status: Married    Spouse name: Not on file   Number of children: 2   Years of education: Not on file   Highest education level: Associate degree: occupational, Scientist, product/process development, or vocational program  Occupational History   Occupation: Retired  Tobacco Use   Smoking status: Former    Current packs/day: 0.50    Average packs/day: 0.5 packs/day for 4.0 years (2.0 ttl pk-yrs)    Types: Cigarettes   Smokeless tobacco: Never   Tobacco comments:    1979  Vaping Use   Vaping status: Never Used  Substance and Sexual Activity   Alcohol use: No   Drug use: No   Sexual activity: Not on file  Other Topics Concern   Not on file  Social History Narrative   Not on file   Social Determinants of Health   Financial Resource Strain: Low Risk  (06/16/2023)   Overall Financial Resource Strain (CARDIA)    Difficulty of Paying Living Expenses: Not hard at all  Food Insecurity: No Food Insecurity (06/16/2023)   Hunger Vital Sign    Worried About Running Out of Food in the Last Year: Never true    Ran Out of Food in the Last Year: Never true  Transportation Needs: No Transportation Needs (06/16/2023)   PRAPARE - Administrator, Civil Service (Medical): No    Lack of Transportation (Non-Medical): No  Physical Activity: Inactive (06/16/2023)   Exercise Vital Sign    Days of Exercise per Week: 0 days    Minutes of Exercise per Session: 0 min  Stress: No Stress Concern Present (06/16/2023)   [employer] of Occupational Health - Occupational Stress Questionnaire    Feeling of Stress : Not at  all  Social Connections: Socially Isolated (06/16/2023)   Social Connection and Isolation Panel [NHANES]    Frequency of Communication with Friends and Family: Twice a week    Frequency of Social Gatherings with Friends and Family: More than three times a week    Attends Religious Services: Never    Database administrator or Organizations: No    Attends Banker Meetings: Never    Marital Status: Widowed    Tobacco Counseling Counseling given: Not Answered Tobacco comments: 1979   Clinical Intake:  Pre-visit preparation completed: Yes  Pain : No/denies pain     BMI - recorded: 29.5  Nutritional Status: BMI 25 -29 Overweight Nutritional Risks: None Diabetes: No  How often do you need to have someone help you when you read instructions, pamphlets, or other written materials from your doctor or pharmacy?: 1 - Never  Interpreter Needed?: No  Information entered by :: Kennedy Bucker, LPN   Activities of Daily Living    06/16/2023   11:07 AM 02/06/2023   10:26 AM  In your present state of health, do you have any difficulty performing the following activities:  Hearing? 0 0  Vision? 0 0  Difficulty concentrating or making decisions? 0 0  Walking or climbing stairs? 1 1  Dressing or bathing? 0 1  Doing errands, shopping? 0 1  Preparing Food and eating ? N   Using the Toilet? Y   In the past six months, have you accidently leaked urine? N   Do you have problems with loss of bowel control? N   Managing your Medications? Y   Managing your Finances? Y   Housekeeping or managing your Housekeeping? Y     Patient Care Team: Lorre Munroe, NP as PCP - General (Internal Medicine) Lockie Mola, MD as Referring Physician (Ophthalmology) Lonell Face, MD as Consulting Physician (Neurology) Gustavus Bryant, LCSW as Social Worker (Licensed Clinical Social Worker) Randa Evens, Lovell Sheehan, RMA Pllc, [facility]  Indicate any recent Medical Services you  may have received from other than [facility] providers in the past year (date may be approximate).     Assessment:   This is a routine wellness examination for Farrell.  Hearing/Vision screen Hearing Screening - Comments:: No aids Vision Screening - Comments:: Readers- Dr.Woodard   Goals Addressed             This Visit's Progress    Cut out extra servings         Depression Screen    06/16/2023   11:04 AM 02/06/2023   10:26 AM 12/12/2022    2:02 PM 09/16/2022    1:44 PM 08/18/2022   11:29 AM 06/10/2022    1:26 PM 05/03/2022    3:16 PM  PHQ 2/9 Scores  PHQ - 2 Score 0 3 2 3 2 1    PHQ- 9 Score 0 10 12 15 3 3    Exception Documentation       Other- indicate reason in comment box  Not completed       dementia    Fall Risk    06/16/2023   11:06 AM 02/06/2023   10:26 AM 12/12/2022    2:03 PM 09/16/2022    1:44 PM 08/18/2022   11:29 AM  Fall Risk   Falls in the past year? 0 1 0 0 1  Number falls in past yr: 0 0   0  Injury with Fall? 0 1 0 0 0  Risk for fall due to : No Fall Risks Impaired mobility No Fall Risks Impaired balance/gait;Impaired mobility History of fall(s);Impaired balance/gait;Impaired mobility  Follow up Falls prevention discussed;Falls evaluation completed    Falls evaluation completed    MEDICARE RISK AT HOME: Medicare Risk at Home Any stairs in or around the home?: Yes If so, are there any without handrails?: No Home free of loose throw rugs in walkways, pet beds, electrical cords, etc?: Yes Adequate lighting in your home to reduce risk of falls?: Yes Life alert?: No Use of a cane, walker or w/c?: Yes (walker) Grab bars in the bathroom?: Yes Shower chair or bench in shower?: Yes Elevated  toilet seat or a handicapped toilet?: Yes  TIMED UP AND GO:  Was the test performed?  No    Cognitive Function:        06/16/2023   11:08 AM 01/27/2017    9:41 AM  6CIT Screen  What Year? 4 points 0 points  What month? 3 points 0 points  What time? 0 points 0  points  Count back from 20 4 points 0 points  Months in reverse 4 points 0 points  Repeat phrase 0 points 2 points  Total Score 15 points 2 points    Immunizations Immunization History  Administered Date(s) Administered   Fluad Quad(high Dose 65+) 04/29/2021, 05/03/2022   Influenza, High Dose Seasonal PF 07/13/2016, 05/04/2017   Influenza,inj,Quad PF,6+ Mos 09/08/2020   Influenza-Unspecified 05/01/2014   Pneumococcal Conjugate-13 07/09/2014   Pneumococcal Polysaccharide-23 01/03/2020   Td 01/13/2005   Tdap 12/12/2022   Zoster, Live 01/09/2012    TDAP status: Up to date  Flu Vaccine status: Declined, Education has been provided regarding the importance of this vaccine but patient still declined. Advised may receive this vaccine at local pharmacy or Health Dept. Aware to provide a copy of the vaccination record if obtained from local pharmacy or Health Dept. Verbalized acceptance and understanding.  Pneumococcal vaccine status: Up to date  Covid-19 vaccine status: Declined, Education has been provided regarding the importance of this vaccine but patient still declined. Advised may receive this vaccine at local pharmacy or Health Dept.or vaccine clinic. Aware to provide a copy of the vaccination record if obtained from local pharmacy or Health Dept. Verbalized acceptance and understanding.  Qualifies for Shingles Vaccine? Yes   Zostavax completed Yes   Shingrix Completed?: No.    Education has been provided regarding the importance of this vaccine. Patient has been advised to call insurance company to determine out of pocket expense if they have not yet received this vaccine. Advised may also receive vaccine at local pharmacy or Health Dept. Verbalized acceptance and understanding.  Screening Tests Health Maintenance  Topic Date Due   Zoster Vaccines- Shingrix (1 of 2) 05/30/1985   DEXA SCAN  09/17/2023 (Originally 07/23/2017)   INFLUENZA VACCINE  10/30/2023 (Originally 03/02/2023)    Medicare Annual Wellness (AWV)  06/15/2024   DTaP/Tdap/Td (3 - Td or Tdap) 12/11/2032   Pneumonia Vaccine 75+ Years old  Completed   HPV VACCINES  Aged Out   COVID-19 Vaccine  Discontinued    Health Maintenance  Health Maintenance Due  Topic Date Due   Zoster Vaccines- Shingrix (1 of 2) 05/30/1985    Colorectal cancer screening: No longer required.   Mammogram status: No longer required due to age.   Lung Cancer Screening: (Low Dose CT Chest recommended if Age 79-80 years, 20 pack-year currently smoking OR have quit w/in 15years.) does not qualify.    Additional Screening:  Hepatitis C Screening: does not qualify; Completed no  Vision Screening: Recommended annual ophthalmology exams for early detection of glaucoma and other disorders of the eye. Is the patient up to date with their annual eye exam?  Yes  Who is the provider or what is the name of the office in which the patient attends annual eye exams? Dr.Woodard If pt is not established with a provider, would they like to be referred to a provider to establish care? No .   Dental Screening: Recommended annual dental exams for proper oral hygiene   Community Resource Referral / Chronic Care Management: CRR required this visit?  No  CCM required this visit?  No     Plan:     I have personally reviewed and noted the following in the patient's chart:   Medical and social history Use of alcohol, tobacco or illicit drugs  Current medications and supplements including opioid prescriptions. Patient is not currently taking opioid prescriptions. Functional ability and status Nutritional status Physical activity Advanced directives List of other physicians Hospitalizations, surgeries, and ER visits in previous 12 months Vitals Screenings to include cognitive, depression, and falls Referrals and appointments  In addition, I have reviewed and discussed with patient certain preventive protocols, quality metrics,  and best practice recommendations. A written personalized care plan for preventive services as well as general preventive health recommendations were provided to patient.     Hal Hope, LPN   [id number]   After Visit Summary: (MyChart) Due to this being a telephonic visit, the after visit summary with patients personalized plan was offered to patient via MyChart   Nurse Notes: none

## 2023-06-16 NOTE — Patient Instructions (Addendum)
Ms. Linenberger , Thank you for taking time to come for your Medicare Wellness Visit. I appreciate your ongoing commitment to your health goals. Please review the following plan we discussed and let me know if I can assist you in the future.   Referrals/Orders/Follow-Ups/Clinician Recommendations: none  This is a list of the screening recommended for you and due dates:  Health Maintenance  Topic Date Due   Zoster (Shingles) Vaccine (1 of 2) 05/30/1985   DEXA scan (bone density measurement)  09/17/2023*   Flu Shot  10/30/2023*   Medicare Annual Wellness Visit  06/15/2024   DTaP/Tdap/Td vaccine (3 - Td or Tdap) 12/11/2032   Pneumonia Vaccine  Completed   HPV Vaccine  Aged Out   COVID-19 Vaccine  Discontinued  *Topic was postponed. The date shown is not the original due date.    Advanced directives: (ACP Link)Information on Advanced Care Planning can be found at [facility] of [facility] Directives Advance Health Care Directives ([email or web address]   Next Medicare Annual Wellness Visit scheduled for next year: Yes   06/21/24 @ 10:10 am by phone

## 2023-08-10 ENCOUNTER — Other Ambulatory Visit: Payer: Self-pay | Admitting: Internal Medicine

## 2023-08-10 DIAGNOSIS — W5503XA Scratched by cat, initial encounter: Secondary | ICD-10-CM

## 2023-08-14 ENCOUNTER — Encounter: Payer: Self-pay | Admitting: Internal Medicine

## 2023-08-14 ENCOUNTER — Ambulatory Visit (INDEPENDENT_AMBULATORY_CARE_PROVIDER_SITE_OTHER): Payer: Medicare PPO | Admitting: Internal Medicine

## 2023-08-14 VITALS — BP 116/72 | Ht 63.0 in

## 2023-08-14 DIAGNOSIS — Z8673 Personal history of transient ischemic attack (TIA), and cerebral infarction without residual deficits: Secondary | ICD-10-CM

## 2023-08-14 DIAGNOSIS — R739 Hyperglycemia, unspecified: Secondary | ICD-10-CM | POA: Diagnosis not present

## 2023-08-14 DIAGNOSIS — E7800 Pure hypercholesterolemia, unspecified: Secondary | ICD-10-CM

## 2023-08-14 DIAGNOSIS — G309 Alzheimer's disease, unspecified: Secondary | ICD-10-CM

## 2023-08-14 DIAGNOSIS — G4700 Insomnia, unspecified: Secondary | ICD-10-CM | POA: Insufficient documentation

## 2023-08-14 DIAGNOSIS — F320 Major depressive disorder, single episode, mild: Secondary | ICD-10-CM | POA: Diagnosis not present

## 2023-08-14 DIAGNOSIS — F0150 Vascular dementia without behavioral disturbance: Secondary | ICD-10-CM

## 2023-08-14 DIAGNOSIS — M8589 Other specified disorders of bone density and structure, multiple sites: Secondary | ICD-10-CM | POA: Diagnosis not present

## 2023-08-14 DIAGNOSIS — K2100 Gastro-esophageal reflux disease with esophagitis, without bleeding: Secondary | ICD-10-CM | POA: Diagnosis not present

## 2023-08-14 DIAGNOSIS — S60519A Abrasion of unspecified hand, initial encounter: Secondary | ICD-10-CM

## 2023-08-14 DIAGNOSIS — W5503XA Scratched by cat, initial encounter: Secondary | ICD-10-CM

## 2023-08-14 DIAGNOSIS — M1990 Unspecified osteoarthritis, unspecified site: Secondary | ICD-10-CM

## 2023-08-14 DIAGNOSIS — I10 Essential (primary) hypertension: Secondary | ICD-10-CM | POA: Diagnosis not present

## 2023-08-14 DIAGNOSIS — F0280 Dementia in other diseases classified elsewhere without behavioral disturbance: Secondary | ICD-10-CM

## 2023-08-14 DIAGNOSIS — F5101 Primary insomnia: Secondary | ICD-10-CM

## 2023-08-14 MED ORDER — LISINOPRIL 10 MG PO TABS
ORAL_TABLET | ORAL | 1 refills | Status: DC
Start: 2023-08-14 — End: 2024-04-30

## 2023-08-14 MED ORDER — SERTRALINE HCL 100 MG PO TABS: 100.0000 mg | ORAL_TABLET | Freq: Every day | ORAL | 1 refills | Status: DC

## 2023-08-14 MED ORDER — FUROSEMIDE 20 MG PO TABS: ORAL_TABLET | ORAL | 1 refills | Status: AC

## 2023-08-14 MED ORDER — TRAZODONE HCL 50 MG PO TABS: ORAL_TABLET | ORAL | 1 refills | Status: DC

## 2023-08-14 MED ORDER — SIMVASTATIN 20 MG PO TABS: ORAL_TABLET | ORAL | 1 refills | Status: AC

## 2023-08-14 NOTE — Telephone Encounter (Signed)
 Requested Prescriptions  Pending Prescriptions Disp Refills   lisinopril  (ZESTRIL ) 10 MG tablet [Pharmacy Med Name: LISINOPRIL  10MG  TABLETS] 30 tablet 0    Sig: TAKE 1 TABLET(10 MG) BY MOUTH DAILY     Cardiovascular:  ACE Inhibitors Failed - 08/14/2023 12:03 PM      Failed - Cr in normal range and within 180 days    Creat  Date Value Ref Range Status  09/16/2022 0.95 0.60 - 0.95 mg/dL Final         Failed - K in normal range and within 180 days    Potassium  Date Value Ref Range Status  09/16/2022 3.9 3.5 - 5.3 mmol/L Final  07/30/2014 4.0 3.5 - 5.1 mmol/L Final         Passed - Patient is not pregnant      Passed - Last BP in normal range    BP Readings from Last 1 Encounters:  02/06/23 124/72         Passed - Valid encounter within last 6 months    Recent Outpatient Visits           6 months ago Pain and swelling of forearm, left   [place] [facility] [place], Angeline ORN, NP   8 months ago Cat scratch of hand, unspecified laterality, initial encounter   [place] [facility] [place], Angeline ORN, NP   11 months ago Encounter for general adult medical examination with abnormal findings   [place] [facility] [place], Angeline ORN, NP   12 months ago Yeast vaginitis   [place] [facility] [place] Marsa PARAS, DO   1 year ago Hypercholesteremia   [place] [facility] [place], Angeline ORN, NP       Future Appointments             Today [place], Angeline ORN, NP [place] [facility]

## 2023-08-14 NOTE — Assessment & Plan Note (Signed)
C-Met and lipid profile today Encouraged her to consume a low-fat diet Can any simvastatin and aspirin

## 2023-08-14 NOTE — Assessment & Plan Note (Signed)
Stable off meds She is currently being cared for at home by her sons

## 2023-08-14 NOTE — Assessment & Plan Note (Signed)
 Increase sertraline to 100 mg daily We will monitor

## 2023-08-14 NOTE — Patient Instructions (Signed)

## 2023-08-14 NOTE — Assessment & Plan Note (Signed)
She is not a surgical candidate Continue Tylenol OTC as needed

## 2023-08-14 NOTE — Assessment & Plan Note (Signed)
 Continue calcium and vitamin D

## 2023-08-14 NOTE — Assessment & Plan Note (Signed)
C-Met and lipid profile today Encouraged her to consume low-fat diet Continue simvastatin and aspirin

## 2023-08-14 NOTE — Assessment & Plan Note (Signed)
 Continue trazodone as previously prescribed.

## 2023-08-14 NOTE — Assessment & Plan Note (Signed)
Controlled on lisinopril and furosemide, refilled today Reinforced DASH diet C-Met today

## 2023-08-14 NOTE — Progress Notes (Signed)
 Subjective:    Patient ID: Susan Campos, female    DOB: 12/24/1932, 89 y.o.   MRN: [id number]  HPI  Patient presents to clinic today for follow-up of chronic conditions.  HTN: Her BP today is 116/72.  She is taking lisinopril  and furosemide  as prescribed.  ECG from 11/2016 reviewed.  HLD with history of stroke: Her last LDL was 105, triglycerides 182, 09/2022.  She denies myalgias on simvastatin .  She is taking aspirin as well.  She tries to consume low-fat diet.  GERD: She is not sure what triggers this.  She is not taking any medications for this.  There is no upper GI on file.  OA: Mainly in her knees.  She takes tylenol  as needed with good relief of symptoms.  She does not follow with orthopedics.  Osteopenia: She is taking calcium and vitamin d  OTC.  She does not get weightbearing exercise.  Bone density from 07/2014 reviewed.  Alzheimer's: Mild cognitive and stable functional needs.  She is not taking any medications for this.  MRI brain from 04/2016 reviewed.  She does not follow with neurology.  Depression: Chronic, managed on sertraline .  Her son does feel like her dose could be increased.  She is not currently seeing a therapist.  She denies anxiety, SI/HI.  Insomnia: She has difficulty falling and staying asleep.  She is taking trazodone  as prescribed.  There is no sleep study on file.  Review of Systems     Past Medical History:  Diagnosis Date   Arthritis    HIPS   Asthma    HX OF   Cancer (HCC)    skin   Hypertension    Orthopnea    sleeps in recliner, cannot lay flat on her back   Stroke [facility])    CVA 2012/ MEMORY LOSS, 1999, 1 more in 2011   Vertigo    OCCASIONAL    Current Outpatient Medications  Medication Sig Dispense Refill   acetaminophen  (TYLENOL ) 650 MG CR tablet Take by mouth every 8 (eight) hours as needed.      albuterol  (VENTOLIN  HFA) 108 (90 Base) MCG/ACT inhaler INHALE 2 PUFFS INTO THE LUNGS EVERY 6 HOURS AS NEEDED FOR WHEEZING OR  SHORTNESS OF BREATH 54 g 0   amoxicillin -clavulanate (AUGMENTIN ) 875-125 MG tablet Take 1 tablet by mouth 2 (two) times daily. (Patient not taking: Reported on 06/16/2023) 20 tablet 0   aspirin EC 81 MG tablet Take 81 mg by mouth daily. 9 am     calcium carbonate (TUMS - DOSED IN MG ELEMENTAL CALCIUM) 500 MG chewable tablet Chew by mouth as needed. (Patient not taking: Reported on 06/16/2023)     cholecalciferol  (VITAMIN D3) 25 MCG (1000 UNIT) tablet Take 1,000 Units by mouth daily.     furosemide  (LASIX ) 20 MG tablet TAKE 1 TABLET(20 MG) BY MOUTH DAILY 30 tablet 0   lisinopril  (ZESTRIL ) 10 MG tablet TAKE 1 TABLET(10 MG) BY MOUTH DAILY 30 tablet 0   Multiple Vitamin (MULTIVITAMIN) capsule Take 1 capsule by mouth daily. 9 AM     nystatin  (MYCOSTATIN /NYSTOP ) powder Apply 1 Application topically 3 (three) times daily. 60 g 0   sertraline  (ZOLOFT ) 50 MG tablet Take 1 tablet (50 mg total) by mouth daily. 30 tablet 0   simvastatin  (ZOCOR ) 20 MG tablet TAKE 1 TABLET(20 MG) BY MOUTH DAILY 30 tablet 0   traZODone  (DESYREL ) 50 MG tablet TAKE 1/2 TO 1 TABLET(25 TO 50 MG) BY MOUTH AT BEDTIME AS NEEDED FOR  SLEEP 90 tablet 0   No current facility-administered medications for this visit.    Allergies  Allergen Reactions   Lovastatin Shortness Of Breath and Palpitations   Aricept [Donepezil Hcl]     dizziness    Family History  Problem Relation Age of Onset   Heart attack Mother    Heart attack Father    Heart attack Paternal Grandmother    Skin cancer Paternal Grandmother     Social History   Socioeconomic History   Marital status: Married    Spouse name: Not on file   Number of children: 2   Years of education: Not on file   Highest education level: Associate degree: occupational, scientist, product/process development, or vocational program  Occupational History   Occupation: Retired  Tobacco Use   Smoking status: Former    Current packs/day: 0.50    Average packs/day: 0.5 packs/day for 4.0 years (2.0 ttl pk-yrs)     Types: Cigarettes   Smokeless tobacco: Never   Tobacco comments:    1979  Vaping Use   Vaping status: Never Used  Substance and Sexual Activity   Alcohol use: No   Drug use: No   Sexual activity: Not on file  Other Topics Concern   Not on file  Social History Narrative   Not on file   Social Drivers of Health   Financial Resource Strain: Low Risk  (06/16/2023)   Overall Financial Resource Strain (CARDIA)    Difficulty of Paying Living Expenses: Not hard at all  Food Insecurity: No Food Insecurity (06/16/2023)   Hunger Vital Sign    Worried About Running Out of Food in the Last Year: Never true    Ran Out of Food in the Last Year: Never true  Transportation Needs: No Transportation Needs (06/16/2023)   PRAPARE - Administrator, Civil Service (Medical): No    Lack of Transportation (Non-Medical): No  Physical Activity: Inactive (06/16/2023)   Exercise Vital Sign    Days of Exercise per Week: 0 days    Minutes of Exercise per Session: 0 min  Stress: No Stress Concern Present (06/16/2023)   [employer] of Occupational Health - Occupational Stress Questionnaire    Feeling of Stress : Not at all  Social Connections: Socially Isolated (06/16/2023)   Social Connection and Isolation Panel [NHANES]    Frequency of Communication with Friends and Family: Twice a week    Frequency of Social Gatherings with Friends and Family: More than three times a week    Attends Religious Services: Never    Database Administrator or Organizations: No    Attends Banker Meetings: Never    Marital Status: Widowed  Intimate Partner Violence: Not At Risk (06/16/2023)   Humiliation, Afraid, Rape, and Kick questionnaire    Fear of Current or Ex-Partner: No    Emotionally Abused: No    Physically Abused: No    Sexually Abused: No     Constitutional: Denies fever, malaise, fatigue, headache or abrupt weight changes.  HEENT: Denies eye pain, eye redness, ear pain,  ringing in the ears, wax buildup, runny nose, nasal congestion, bloody nose, or sore throat. Respiratory: Denies difficulty breathing, shortness of breath, cough or sputum production.   Cardiovascular: Denies chest pain, chest tightness, palpitations or swelling in the hands or feet.  Gastrointestinal: Denies abdominal pain, bloating, constipation, diarrhea or blood in the stool.  GU: Denies urgency, frequency, pain with urination, burning sensation, blood in urine, odor or discharge. Musculoskeletal:  Patient reports joint pain in knees, difficulty with gait.  Denies decrease in range of motion, muscle pain or joint swelling.  Skin: Denies redness, rashes, lesions or ulcercations.  Neurological: Patient reports difficulty with memory, insomnia.  Denies dizziness, difficulty with speech or problems with balance and coordination.  Psych: Patient has a history of depression.  Denies anxiety, SI/HI.  No other specific complaints in a complete review of systems (except as listed in HPI above).  Objective:   Physical Exam BP 116/72 (BP Location: Left Arm, Patient Position: Sitting, Cuff Size: Normal)   Ht 5' 3 (1.6 m)   BMI 30.47 kg/m    Wt Readings from Last 3 Encounters:  08/18/22 172 lb (78 kg)  06/10/22 172 lb (78 kg)  12/28/21 172 lb (78 kg)    General: Appears her stated age, chronically ill appearing in NAD. Skin: Warm, dry and intact.  HEENT: Head: normal shape and size; Eyes: sclera white, no icterus, conjunctiva pink, PERRLA and EOMs intact;  Cardiovascular: Normal rate and rhythm. S1,S2 noted.  No murmur, rubs or gallops noted. 1+ nonpitting BLE edema. No carotid bruits noted. Pulmonary/Chest: Normal effort and positive vesicular breath sounds. No respiratory distress. No wheezes, rales or ronchi noted.  Abdomen: Normal bowel sounds.  Musculoskeletal: In wheelchair today. Neurological: Alert. Difficulty with recall. Repeats herself frequently. Psychiatric: Mood and affect  normal. Behavior is normal. Judgment and thought content normal.    BMET    Component Value Date/Time   NA 145 09/16/2022 1342   NA 145 (H) 09/08/2020 1620   NA 139 07/30/2014 0633   K 3.9 09/16/2022 1342   K 4.0 07/30/2014 0633   CL 107 09/16/2022 1342   CL 104 07/30/2014 0633   CO2 30 09/16/2022 1342   CO2 30 07/30/2014 0633   GLUCOSE 77 09/16/2022 1342   GLUCOSE 101 (H) 07/30/2014 0633   BUN 14 09/16/2022 1342   BUN 16 09/08/2020 1620   BUN 8 07/30/2014 0633   CREATININE 0.95 09/16/2022 1342   CALCIUM 9.3 09/16/2022 1342   CALCIUM 8.5 07/30/2014 0633   GFRNONAA 53 (L) 09/08/2020 1620   GFRNONAA 61 01/03/2020 1130   GFRAA 61 09/08/2020 1620   GFRAA 71 01/03/2020 1130    Lipid Panel     Component Value Date/Time   CHOL 172 09/16/2022 1342   CHOL 206 (H) 01/10/2018 1228   TRIG 182 (H) 09/16/2022 1342   HDL 37 (L) 09/16/2022 1342   HDL 35 (L) 01/10/2018 1228   CHOLHDL 4.6 09/16/2022 1342   LDLCALC 105 (H) 09/16/2022 1342    CBC    Component Value Date/Time   WBC 4.0 09/16/2022 1342   RBC 4.23 09/16/2022 1342   HGB 12.9 09/16/2022 1342   HGB 13.1 01/10/2018 1228   HCT 37.8 09/16/2022 1342   HCT 39.1 01/10/2018 1228   PLT 139 (L) 09/16/2022 1342   PLT 199 01/10/2018 1228   MCV 89.4 09/16/2022 1342   MCV 87 01/10/2018 1228   MCV 89 07/16/2014 0951   MCH 30.5 09/16/2022 1342   MCHC 34.1 09/16/2022 1342   RDW 12.3 09/16/2022 1342   RDW 12.8 01/10/2018 1228   RDW 13.3 07/16/2014 0951   LYMPHSABS 2,321 01/03/2020 1130   LYMPHSABS 1.9 10/20/2015 1152   EOSABS 123 01/03/2020 1130   EOSABS 0.1 10/20/2015 1152   BASOSABS 33 01/03/2020 1130   BASOSABS 0.0 10/20/2015 1152    Hgb A1C Lab Results  Component Value Date   HGBA1C 5.4 09/16/2022  Assessment & Plan:     RTC in 6 months for your annual exam Angeline Laura, NP

## 2023-08-14 NOTE — Assessment & Plan Note (Signed)
Currently not an issue off meds We will monitor 

## 2024-02-13 ENCOUNTER — Ambulatory Visit: Payer: Self-pay | Admitting: Internal Medicine

## 2024-02-13 NOTE — Progress Notes (Deleted)
 Subjective:    Patient ID: Susan Campos, female    DOB: Feb 19, 1933, 89 y.o.   MRN: [id number]  HPI  Patient presents to clinic today for an annual exam.  Flu: 05/2022 Tetanus: 11/2022 COVID: never Pneumovax: 12/2019 Prevnar: 07/2014 Zostavax: 12/2011 Shingrix: Never Pap smear: No longer screening Mammogram: No longer screening Bone density: 07/2014 Colon screening: No longer screening Vision screening: as needed Dentist: as needed  Diet: She does eat meat. She consumes fruits and veggies, She does eat some fried foods. She drinks mostly water. Exercise: None  Review of Systems     Past Medical History:  Diagnosis Date   Arthritis    HIPS   Asthma    HX OF   Cancer (HCC)    skin   Hypertension    Orthopnea    sleeps in recliner, cannot lay flat on her back   Stroke [facility])    CVA 2012/ MEMORY LOSS, 1999, 1 more in 2011   Vertigo    OCCASIONAL    Current Outpatient Medications  Medication Sig Dispense Refill   acetaminophen  (TYLENOL ) 650 MG CR tablet Take by mouth every 8 (eight) hours as needed.      albuterol  (VENTOLIN  HFA) 108 (90 Base) MCG/ACT inhaler INHALE 2 PUFFS INTO THE LUNGS EVERY 6 HOURS AS NEEDED FOR WHEEZING OR SHORTNESS OF BREATH 54 g 0   aspirin EC 81 MG tablet Take 81 mg by mouth daily. 9 am     calcium carbonate (TUMS - DOSED IN MG ELEMENTAL CALCIUM) 500 MG chewable tablet Chew by mouth as needed.     cholecalciferol (VITAMIN D3) 25 MCG (1000 UNIT) tablet Take 1,000 Units by mouth daily.     furosemide  (LASIX ) 20 MG tablet TAKE 1 TABLET(20 MG) BY MOUTH DAILY 90 tablet 1   lisinopril  (ZESTRIL ) 10 MG tablet TAKE 1 TABLET(10 MG) BY MOUTH DAILY 90 tablet 1   Multiple Vitamin (MULTIVITAMIN) capsule Take 1 capsule by mouth daily. 9 AM     nystatin  (MYCOSTATIN /NYSTOP ) powder Apply 1 Application topically 3 (three) times daily. 60 g 0   sertraline  (ZOLOFT ) 100 MG tablet Take 1 tablet (100 mg total) by mouth daily. 90 tablet 1   simvastatin   (ZOCOR ) 20 MG tablet TAKE 1 TABLET(20 MG) BY MOUTH DAILY 90 tablet 1   traZODone  (DESYREL ) 50 MG tablet TAKE 1/2 TO 1 TABLET(25 TO 50 MG) BY MOUTH AT BEDTIME AS NEEDED FOR SLEEP 90 tablet 1   No current facility-administered medications for this visit.    Allergies  Allergen Reactions   Lovastatin Shortness Of Breath and Palpitations   Aricept [Donepezil Hcl]     dizziness    Family History  Problem Relation Age of Onset   Heart attack Mother    Heart attack Father    Heart attack Paternal Grandmother    Skin cancer Paternal Grandmother     Social History   Socioeconomic History   Marital status: Married    Spouse name: Not on file   Number of children: 2   Years of education: Not on file   Highest education level: Associate degree: occupational, Scientist, product/process development, or vocational program  Occupational History   Occupation: Retired  Tobacco Use   Smoking status: Former    Current packs/day: 0.50    Average packs/day: 0.5 packs/day for 4.0 years (2.0 ttl pk-yrs)    Types: Cigarettes   Smokeless tobacco: Never   Tobacco comments:    1979  Vaping Use   Vaping status:  Never Used  Substance and Sexual Activity   Alcohol use: No   Drug use: No   Sexual activity: Not on file  Other Topics Concern   Not on file  Social History Narrative   Not on file   Social Drivers of Health   Financial Resource Strain: Low Risk  (06/16/2023)   Overall Financial Resource Strain (CARDIA)    Difficulty of Paying Living Expenses: Not hard at all  Food Insecurity: No Food Insecurity (06/16/2023)   Hunger Vital Sign    Worried About Running Out of Food in the Last Year: Never true    Ran Out of Food in the Last Year: Never true  Transportation Needs: No Transportation Needs (06/16/2023)   PRAPARE - Administrator, Civil Service (Medical): No    Lack of Transportation (Non-Medical): No  Physical Activity: Inactive (06/16/2023)   Exercise Vital Sign    Days of Exercise per Week: 0  days    Minutes of Exercise per Session: 0 min  Stress: No Stress Concern Present (06/16/2023)   [employer] of Occupational Health - Occupational Stress Questionnaire    Feeling of Stress : Not at all  Social Connections: Socially Isolated (06/16/2023)   Social Connection and Isolation Panel    Frequency of Communication with Friends and Family: Twice a week    Frequency of Social Gatherings with Friends and Family: More than three times a week    Attends Religious Services: Never    Database administrator or Organizations: No    Attends Banker Meetings: Never    Marital Status: Widowed  Intimate Partner Violence: Not At Risk (06/16/2023)   Humiliation, Afraid, Rape, and Kick questionnaire    Fear of Current or Ex-Partner: No    Emotionally Abused: No    Physically Abused: No    Sexually Abused: No     Constitutional: Denies fever, malaise, fatigue, headache or abrupt weight changes.  HEENT: Denies eye pain, eye redness, ear pain, ringing in the ears, wax buildup, runny nose, nasal congestion, bloody nose, or sore throat. Respiratory: Denies difficulty breathing, shortness of breath, cough or sputum production.   Cardiovascular: Denies chest pain, chest tightness, palpitations or swelling in the hands or feet.  Gastrointestinal: Patient son reports intermittent constipation.  Denies abdominal pain, bloating, diarrhea or blood in the stool.  GU: Patient son reports urinary incontinence.  Denies frequency, pain with urination, burning sensation, blood in urine, odor or discharge. Musculoskeletal: Patient reports joint pain, difficulty with gait..  Denies decrease in range of motion, muscle pain or joint swelling.  Skin: Denies redness, rashes, lesions or ulcercations.  Neurological: Patient reports insomnia, difficulty with memory, problems with balance and coordination.  Denies dizziness, difficulty with speech.  Psych: Patient has a history of depression.  Denies  anxiety, SI/HI.  No other specific complaints in a complete review of systems (except as listed in HPI above).  Objective:   Physical Exam  There were no vitals taken for this visit.  Wt Readings from Last 3 Encounters:  08/18/22 172 lb (78 kg)  06/10/22 172 lb (78 kg)  12/28/21 172 lb (78 kg)    General: Appears her stated age, obese chronically ill-appearing, in NAD. Skin: Warm, dry and intact.  HEENT: Head: normal shape and size; Eyes: sclera white, no icterus, conjunctiva pink, PERRLA and EOMs intact;  Cardiovascular: Normal rate and rhythm. S1,S2 noted.  No murmur, rubs or gallops noted. No JVD.  Trace BLE edema. No  carotid bruits noted. Pulmonary/Chest: Normal effort and positive vesicular breath sounds. No respiratory distress. No wheezes, rales or ronchi noted.  Abdomen: Soft and nontender. Normal bowel sounds.  Musculoskeletal: In wheelchair. Neurological: Alert.  Has difficulty with recall but answers questions appropriately. Psychiatric: Mood and affect normal. Behavior is normal. Judgment and thought content normal.    BMET    Component Value Date/Time   NA 145 09/16/2022 1342   NA 145 (H) 09/08/2020 1620   NA 139 07/30/2014 0633   K 3.9 09/16/2022 1342   K 4.0 07/30/2014 0633   CL 107 09/16/2022 1342   CL 104 07/30/2014 0633   CO2 30 09/16/2022 1342   CO2 30 07/30/2014 0633   GLUCOSE 77 09/16/2022 1342   GLUCOSE 101 (H) 07/30/2014 0633   BUN 14 09/16/2022 1342   BUN 16 09/08/2020 1620   BUN 8 07/30/2014 0633   CREATININE 0.95 09/16/2022 1342   CALCIUM 9.3 09/16/2022 1342   CALCIUM 8.5 07/30/2014 0633   GFRNONAA 53 (L) 09/08/2020 1620   GFRNONAA 61 01/03/2020 1130   GFRAA 61 09/08/2020 1620   GFRAA 71 01/03/2020 1130    Lipid Panel     Component Value Date/Time   CHOL 172 09/16/2022 1342   CHOL 206 (H) 01/10/2018 1228   TRIG 182 (H) 09/16/2022 1342   HDL 37 (L) 09/16/2022 1342   HDL 35 (L) 01/10/2018 1228   CHOLHDL 4.6 09/16/2022 1342   LDLCALC  105 (H) 09/16/2022 1342    CBC    Component Value Date/Time   WBC 4.0 09/16/2022 1342   RBC 4.23 09/16/2022 1342   HGB 12.9 09/16/2022 1342   HGB 13.1 01/10/2018 1228   HCT 37.8 09/16/2022 1342   HCT 39.1 01/10/2018 1228   PLT 139 (L) 09/16/2022 1342   PLT 199 01/10/2018 1228   MCV 89.4 09/16/2022 1342   MCV 87 01/10/2018 1228   MCV 89 07/16/2014 0951   MCH 30.5 09/16/2022 1342   MCHC 34.1 09/16/2022 1342   RDW 12.3 09/16/2022 1342   RDW 12.8 01/10/2018 1228   RDW 13.3 07/16/2014 0951   LYMPHSABS 2,321 01/03/2020 1130   LYMPHSABS 1.9 10/20/2015 1152   EOSABS 123 01/03/2020 1130   EOSABS 0.1 10/20/2015 1152   BASOSABS 33 01/03/2020 1130   BASOSABS 0.0 10/20/2015 1152    Hgb A1C Lab Results  Component Value Date   HGBA1C 5.4 09/16/2022           Assessment & Plan:   Preventative Health Maintenance:  Encouraged her to get a flu shot in the fall Tetanus UTD Encouraged her to get her COVID vaccine Pneumovax and Prevnar 13 UTD She declines Prevnar 20 today Zostavax UTD Discussed Shingrix vaccine, she will check coverage with her insurance company and schedule a visit at the pharmacy if she would like to have this done She no longer wants to screen for cervical cancer She no longer wants to screen for breast cancer She declines flu density She no longer wants to screen for colon cancer Encouraged her to consume a balanced diet and exercise regimen Advised her to see an eye doctor and dentist annually We will check CBC, c-Met, lipid and A1c today  RTC in 6 months, follow-up chronic conditions Angeline Laura, NP

## 2024-04-27 ENCOUNTER — Emergency Department

## 2024-04-27 ENCOUNTER — Inpatient Hospital Stay
Admission: EM | Admit: 2024-04-27 | Discharge: 2024-04-30 | DRG: 580 | Disposition: A | Discharge status: Hospice | Attending: Internal Medicine | Admitting: Internal Medicine

## 2024-04-27 DIAGNOSIS — E785 Hyperlipidemia, unspecified: Secondary | ICD-10-CM | POA: Diagnosis present

## 2024-04-27 DIAGNOSIS — Z96653 Presence of artificial knee joint, bilateral: Secondary | ICD-10-CM | POA: Diagnosis present

## 2024-04-27 DIAGNOSIS — L0291 Cutaneous abscess, unspecified: Secondary | ICD-10-CM | POA: Diagnosis not present

## 2024-04-27 DIAGNOSIS — Z9842 Cataract extraction status, left eye: Secondary | ICD-10-CM

## 2024-04-27 DIAGNOSIS — F0153 Vascular dementia, unspecified severity, with mood disturbance: Secondary | ICD-10-CM | POA: Diagnosis present

## 2024-04-27 DIAGNOSIS — F32A Depression, unspecified: Secondary | ICD-10-CM | POA: Diagnosis present

## 2024-04-27 DIAGNOSIS — M7989 Other specified soft tissue disorders: Secondary | ICD-10-CM | POA: Diagnosis present

## 2024-04-27 DIAGNOSIS — F0280 Dementia in other diseases classified elsewhere without behavioral disturbance: Secondary | ICD-10-CM | POA: Diagnosis present

## 2024-04-27 DIAGNOSIS — Z85820 Personal history of malignant melanoma of skin: Secondary | ICD-10-CM

## 2024-04-27 DIAGNOSIS — I82432 Acute embolism and thrombosis of left popliteal vein: Secondary | ICD-10-CM | POA: Diagnosis present

## 2024-04-27 DIAGNOSIS — Z9841 Cataract extraction status, right eye: Secondary | ICD-10-CM

## 2024-04-27 DIAGNOSIS — L03116 Cellulitis of left lower limb: Secondary | ICD-10-CM | POA: Diagnosis not present

## 2024-04-27 DIAGNOSIS — L8990 Pressure ulcer of unspecified site, unspecified stage: Secondary | ICD-10-CM | POA: Insufficient documentation

## 2024-04-27 DIAGNOSIS — Z79899 Other long term (current) drug therapy: Secondary | ICD-10-CM

## 2024-04-27 DIAGNOSIS — J45909 Unspecified asthma, uncomplicated: Secondary | ICD-10-CM | POA: Diagnosis present

## 2024-04-27 DIAGNOSIS — L02416 Cutaneous abscess of left lower limb: Principal | ICD-10-CM

## 2024-04-27 DIAGNOSIS — Z808 Family history of malignant neoplasm of other organs or systems: Secondary | ICD-10-CM

## 2024-04-27 DIAGNOSIS — Z8673 Personal history of transient ischemic attack (TIA), and cerebral infarction without residual deficits: Secondary | ICD-10-CM

## 2024-04-27 DIAGNOSIS — I82402 Acute embolism and thrombosis of unspecified deep veins of left lower extremity: Secondary | ICD-10-CM | POA: Diagnosis present

## 2024-04-27 DIAGNOSIS — R54 Age-related physical debility: Secondary | ICD-10-CM | POA: Diagnosis present

## 2024-04-27 DIAGNOSIS — Z723 Lack of physical exercise: Secondary | ICD-10-CM

## 2024-04-27 DIAGNOSIS — Z515 Encounter for palliative care: Secondary | ICD-10-CM

## 2024-04-27 DIAGNOSIS — Z7982 Long term (current) use of aspirin: Secondary | ICD-10-CM

## 2024-04-27 DIAGNOSIS — Z66 Do not resuscitate: Secondary | ICD-10-CM | POA: Diagnosis present

## 2024-04-27 DIAGNOSIS — L89151 Pressure ulcer of sacral region, stage 1: Secondary | ICD-10-CM | POA: Diagnosis present

## 2024-04-27 DIAGNOSIS — F0150 Vascular dementia without behavioral disturbance: Secondary | ICD-10-CM | POA: Diagnosis present

## 2024-04-27 DIAGNOSIS — I8290 Acute embolism and thrombosis of unspecified vein: Principal | ICD-10-CM

## 2024-04-27 DIAGNOSIS — I10 Essential (primary) hypertension: Secondary | ICD-10-CM | POA: Diagnosis present

## 2024-04-27 DIAGNOSIS — F17210 Nicotine dependence, cigarettes, uncomplicated: Secondary | ICD-10-CM | POA: Diagnosis present

## 2024-04-27 DIAGNOSIS — F0283 Dementia in other diseases classified elsewhere, unspecified severity, with mood disturbance: Secondary | ICD-10-CM | POA: Diagnosis present

## 2024-04-27 DIAGNOSIS — Z8249 Family history of ischemic heart disease and other diseases of the circulatory system: Secondary | ICD-10-CM

## 2024-04-27 DIAGNOSIS — Z961 Presence of intraocular lens: Secondary | ICD-10-CM | POA: Diagnosis present

## 2024-04-27 DIAGNOSIS — I82412 Acute embolism and thrombosis of left femoral vein: Secondary | ICD-10-CM | POA: Diagnosis present

## 2024-04-27 DIAGNOSIS — G309 Alzheimer's disease, unspecified: Secondary | ICD-10-CM | POA: Diagnosis present

## 2024-04-27 DIAGNOSIS — Z7401 Bed confinement status: Secondary | ICD-10-CM

## 2024-04-27 LAB — LACTIC ACID, PLASMA
Lactic Acid, Venous: 0.8 mmol/L (ref 0.5–1.9)
Lactic Acid, Venous: 1 mmol/L (ref 0.5–1.9)

## 2024-04-27 LAB — CBC WITH DIFFERENTIAL/PLATELET
Abs Immature Granulocytes: 0.03 K/uL (ref 0.00–0.07)
Basophils Absolute: 0 K/uL (ref 0.0–0.1)
Basophils Relative: 0 %
Eosinophils Absolute: 0 K/uL (ref 0.0–0.5)
Eosinophils Relative: 0 %
HCT: 35.5 % — ABNORMAL LOW (ref 36.0–46.0)
Hemoglobin: 11.5 g/dL — ABNORMAL LOW (ref 12.0–15.0)
Immature Granulocytes: 1 %
Lymphocytes Relative: 16 %
Lymphs Abs: 0.8 K/uL (ref 0.7–4.0)
MCH: 29.4 pg (ref 26.0–34.0)
MCHC: 32.4 g/dL (ref 30.0–36.0)
MCV: 90.8 fL (ref 80.0–100.0)
Monocytes Absolute: 2 K/uL — ABNORMAL HIGH (ref 0.1–1.0)
Monocytes Relative: 38 %
Neutro Abs: 2.4 K/uL (ref 1.7–7.7)
Neutrophils Relative %: 45 %
Platelets: 117 K/uL — ABNORMAL LOW (ref 150–400)
RBC: 3.91 MIL/uL (ref 3.87–5.11)
RDW: 13.1 % (ref 11.5–15.5)
WBC: 5.2 K/uL (ref 4.0–10.5)
nRBC: 0 % (ref 0.0–0.2)

## 2024-04-27 LAB — COMPREHENSIVE METABOLIC PANEL WITH GFR
ALT: 13 U/L (ref 0–44)
AST: 24 U/L (ref 15–41)
Albumin: 2.8 g/dL — ABNORMAL LOW (ref 3.5–5.0)
Alkaline Phosphatase: 102 U/L (ref 38–126)
Anion gap: 11 (ref 5–15)
BUN: 11 mg/dL (ref 8–23)
CO2: 28 mmol/L (ref 22–32)
Calcium: 8.7 mg/dL — ABNORMAL LOW (ref 8.9–10.3)
Chloride: 101 mmol/L (ref 98–111)
Creatinine, Ser: 0.5 mg/dL (ref 0.44–1.00)
GFR, Estimated: >60 mL/min (ref >60)
Glucose, Bld: 91 mg/dL (ref 70–99)
Potassium: 3.6 mmol/L (ref 3.5–5.1)
Sodium: 140 mmol/L (ref 135–145)
Total Bilirubin: 1 mg/dL (ref 0.0–1.2)
Total Protein: 6.4 g/dL — ABNORMAL LOW (ref 6.5–8.1)

## 2024-04-27 MED ORDER — HEPARIN BOLUS VIA INFUSION
4000.0000 [IU] | Freq: Once | INTRAVENOUS | Status: AC
  Administered 2024-04-27: 4000 [IU] via INTRAVENOUS
  Filled 2024-04-27: qty 4000

## 2024-04-27 MED ORDER — VANCOMYCIN HCL 1500 MG/300ML IV SOLN
1500.0000 mg | Freq: Once | INTRAVENOUS | Status: AC
  Administered 2024-04-27: 1500 mg via INTRAVENOUS
  Filled 2024-04-27: qty 300

## 2024-04-27 MED ORDER — SODIUM CHLORIDE 0.9 % IV SOLN
1.0000 g | INTRAVENOUS | Status: DC
  Administered 2024-04-28 – 2024-04-29 (×2): 1 g via INTRAVENOUS
  Filled 2024-04-27 (×2): qty 10

## 2024-04-27 MED ORDER — HYDROCODONE-ACETAMINOPHEN 5-325 MG PO TABS
1.0000 | ORAL_TABLET | ORAL | Status: DC | PRN
  Administered 2024-04-28 – 2024-04-29 (×2): 1 via ORAL
  Filled 2024-04-27 (×2): qty 1

## 2024-04-27 MED ORDER — SODIUM CHLORIDE 0.9 % IV SOLN
2.0000 g | Freq: Once | INTRAVENOUS | Status: AC
  Administered 2024-04-27: 2 g via INTRAVENOUS
  Filled 2024-04-27: qty 20

## 2024-04-27 MED ORDER — SERTRALINE HCL 50 MG PO TABS
100.0000 mg | ORAL_TABLET | Freq: Every day | ORAL | Status: DC
  Administered 2024-04-27 – 2024-04-30 (×4): 100 mg via ORAL
  Filled 2024-04-27 (×4): qty 2

## 2024-04-27 MED ORDER — ONDANSETRON HCL 4 MG/2ML IJ SOLN: 4.0000 mg | Freq: Four times a day (QID) | INTRAMUSCULAR | Status: DC | PRN

## 2024-04-27 MED ORDER — HEPARIN (PORCINE) 25000 UT/250ML-% IV SOLN
1150.0000 [IU]/h | INTRAVENOUS | Status: DC
  Administered 2024-04-27 – 2024-04-29 (×3): 1150 [IU]/h via INTRAVENOUS
  Filled 2024-04-27 (×3): qty 250

## 2024-04-27 MED ORDER — ACETAMINOPHEN 325 MG PO TABS
650.0000 mg | ORAL_TABLET | Freq: Four times a day (QID) | ORAL | Status: DC | PRN
  Administered 2024-04-29 – 2024-04-30 (×2): 650 mg via ORAL
  Filled 2024-04-27 (×2): qty 2

## 2024-04-27 MED ORDER — ALBUTEROL SULFATE (2.5 MG/3ML) 0.083% IN NEBU: 2.5000 mg | INHALATION_SOLUTION | RESPIRATORY_TRACT | Status: DC | PRN

## 2024-04-27 MED ORDER — VANCOMYCIN HCL IN DEXTROSE 1-5 GM/200ML-% IV SOLN
1000.0000 mg | INTRAVENOUS | Status: DC
  Administered 2024-04-28 – 2024-04-29 (×2): 1000 mg via INTRAVENOUS
  Filled 2024-04-27 (×2): qty 200

## 2024-04-27 MED ORDER — MORPHINE SULFATE (PF) 2 MG/ML IV SOLN
2.0000 mg | INTRAVENOUS | Status: DC | PRN
  Administered 2024-04-28 – 2024-04-29 (×2): 2 mg via INTRAVENOUS
  Filled 2024-04-27 (×2): qty 1

## 2024-04-27 MED ORDER — ONDANSETRON HCL 4 MG PO TABS: 4.0000 mg | ORAL_TABLET | Freq: Four times a day (QID) | ORAL | Status: DC | PRN

## 2024-04-27 MED ORDER — SIMVASTATIN 20 MG PO TABS
20.0000 mg | ORAL_TABLET | Freq: Every day | ORAL | Status: DC
Start: 2024-04-27 — End: 2024-04-30
  Administered 2024-04-27 – 2024-04-29 (×2): 20 mg via ORAL
  Filled 2024-04-27: qty 2
  Filled 2024-04-27 (×2): qty 1

## 2024-04-27 MED ORDER — ACETAMINOPHEN 650 MG RE SUPP: 650.0000 mg | Freq: Four times a day (QID) | RECTAL | Status: DC | PRN

## 2024-04-27 MED ORDER — MORPHINE SULFATE (PF) 2 MG/ML IV SOLN
2.0000 mg | Freq: Once | INTRAVENOUS | Status: AC
  Administered 2024-04-27: 2 mg via INTRAVENOUS
  Filled 2024-04-27: qty 1

## 2024-04-27 NOTE — ED Provider Notes (Signed)
 [facility] Provider Note    Event Date/Time   First MD Initiated Contact with Patient 04/27/24 1754     (approximate)   History   Wound Check   HPI {Remember to add pertinent medical, surgical, social, and/or OB history to HPI:1} Susan Campos is a 89 y.o. female history of Alzheimer's and dementia, depression previous stroke hypertension     Physical Exam   Triage Vital Signs: ED Triage Vitals [04/27/24 1708]  Encounter Vitals Group     BP (!) 149/71     Girls Systolic BP Percentile      Girls Diastolic BP Percentile      Boys Systolic BP Percentile      Boys Diastolic BP Percentile      Pulse Rate 85     Resp 18     Temp 98.9 F (37.2 C)     Temp Source Oral     SpO2 91 %     Weight      Height      Head Circumference      Peak Flow      Pain Score      Pain Loc      Pain Education      Exclude from Growth Chart     Most recent vital signs: Vitals:   04/27/24 1708  BP: (!) 149/71  Pulse: 85  Resp: 18  Temp: 98.9 F (37.2 C)  SpO2: 91%    {Only need to document appropriate and relevant physical exam:1} General: Awake, no distress. *** CV:  Good peripheral perfusion. *** Resp:  Normal effort. *** Abd:  No distention. *** Other:  ***   ED Results / Procedures / Treatments   Labs (all labs ordered are listed, but only abnormal results are displayed) Labs Reviewed  CBC WITH DIFFERENTIAL/PLATELET - Abnormal; Notable for the following components:      Result Value   Hemoglobin 11.5 (*)    HCT 35.5 (*)    Platelets 117 (*)    Monocytes Absolute 2.0 (*)    All other components within normal limits  LACTIC ACID, PLASMA  LACTIC ACID, PLASMA  COMPREHENSIVE METABOLIC PANEL WITH GFR  URINALYSIS, W/ REFLEX TO CULTURE (INFECTION SUSPECTED)     EKG  ***   RADIOLOGY *** {USE THE WORD INTERPRETED!! You MUST document your own interpretation of imaging, as well as the fact that you reviewed the radiologist's  report!:1}   PROCEDURES:  Critical Care performed: {CriticalCareYesNo:19197::Yes, see critical care procedure note(s),No}  Procedures   MEDICATIONS ORDERED IN ED: Medications - No data to display   IMPRESSION / MDM / ASSESSMENT AND PLAN / ED COURSE  I reviewed the triage vital signs and the nursing notes.                              Differential diagnosis includes, but is not limited to, ***  Patient's presentation is most consistent with {EM COPA:27473}  *** {If the patient is on the monitor, remove the brackets and asterisks on the sentence below and remember to document it as a Procedure as well. Otherwise delete the sentence below:1} {**The patient is on the cardiac monitor to evaluate for evidence of arrhythmia and/or significant heart rate changes.**} {Remember to include, when applicable, any/all of the following data: independent review of imaging independent review of labs (comment specifically on pertinent positives and negatives) review of specific prior hospitalizations, PCP/specialist notes,  etc. discuss meds given and prescribed document any discussion with consultants (including hospitalists) any clinical decision tools you used and why (PECARN, NEXUS, etc.) did you consider admitting the patient? document social determinants of health affecting patient's care (homelessness, inability to follow up in a timely fashion, etc) document any pre-existing conditions increasing risk on current visit (e.g. diabetes and HTN increasing danger of high-risk chest pain/ACS) describes what meds you gave (especially parenteral) and why any other interventions?:1}     FINAL CLINICAL IMPRESSION(S) / ED DIAGNOSES   Final diagnoses:  None     Rx / DC Orders   ED Discharge Orders     None        Note:  This document was prepared using Dragon voice recognition software and may include unintentional dictation errors.

## 2024-04-27 NOTE — Assessment & Plan Note (Addendum)
 Likely provoked as patient is currently nonambulant/bedbound Continue heparin infusion Hold home aspirin to decrease bleeding risk DVT is nonocclusive: Can consider vascular consult for opinion on need for procedure

## 2024-04-27 NOTE — Assessment & Plan Note (Signed)
 Currently takes furosemide  which I will hold We will consider resuming lisinopril  which was previously discontinued-no listed allergy

## 2024-04-27 NOTE — ED Notes (Signed)
 US  at bedside.

## 2024-04-27 NOTE — Assessment & Plan Note (Signed)
 Abscess spontaneously draining Continue Rocephin and vancomycin Follow cultures Surgery consulted from the ED N.p.o. from midnight per surgical request Pain control Formal surgical consult

## 2024-04-27 NOTE — H&P (Signed)
 History and Physical    Patient: Susan Campos FMW:[id number] DOB: 1932/12/27 DOA: 04/27/2024 DOS: the patient was seen and examined on 04/27/2024 PCP: Antonette Angeline ORN, NP  Patient coming from: Home  Chief Complaint:  Chief Complaint  Patient presents with   Wound Check    HPI: Susan Campos is a 89 y.o. female with medical history significant for HTN, HLD, OA, prior pontine stroke 2017 and multiple lacunar strokes, with residual ambulatory dysfunction, bedbound for the past 4 months, as well as history of vascular dementia, cared for at home by her son who is being admitted with cellulitis and abscess left anterior lower leg as well as an extensive nonocclusive DVT of the same leg.  Son noticed an area of redness and swelling a few days prior which has progressed and then started draining purulent material on the day of arrival thus prompting the visit to the ED.  She has had no fever or chills. In the ED, vitals within normal limits except for slightly elevated blood pressure with systolic in the 140s to 150s.  Labs mostly unremarkable with normal WBC of 5.2 and lactic acid 1.0.Hemoglobin 11.5, most recent baseline 12.9 a year ago CMP unremarkable X-ray tib-fib showed diffuse subcutaneous soft tissue edema Nonvascular ultrasound left lower extremity a 4.9 cm abscess centimeter abscess left anterior mid lower leg Venous ultrasound showing a nonocclusive thrombus left common femoral, superficial femoral and popliteal veins  The ED provider spoke with both surgery and vascular.  She was started on heparin.  She will be kept n.p.o. from midnight for possible I&D in the a.m.  She was treated for pain with morphine and started on Rocephin and vancomycin  Admission requested.     Past Medical History:  Diagnosis Date   Arthritis    HIPS   Asthma    HX OF   Cancer (HCC)    skin   Hypertension    Orthopnea    sleeps in recliner, cannot lay flat on her back   Stroke  [facility])    CVA 2012/ MEMORY LOSS, 1999, 1 more in 2011   Vertigo    OCCASIONAL   Past Surgical History:  Procedure Laterality Date   CATARACT EXTRACTION Right    CATARACT EXTRACTION W/PHACO Left 05/27/2015   Procedure: CATARACT EXTRACTION PHACO AND INTRAOCULAR LENS PLACEMENT (IOC);  Surgeon: Dene Etienne, MD;  Location: [facility];  Service: Ophthalmology;  Laterality: Left;   KNEE ARTHROSCOPY     MELANOMA EXCISION     from chest   MOUTH SURGERY     REPLACEMENT TOTAL KNEE BILATERAL     UTERINE SUSPENSION     Social History:  reports that she has quit smoking. Her smoking use included cigarettes. She has a 2 pack-year smoking history. She has never used smokeless tobacco. She reports that she does not drink alcohol and does not use drugs.  Allergies  Allergen Reactions   Lovastatin Shortness Of Breath and Palpitations   Aricept [Donepezil Hcl]     dizziness    Family History  Problem Relation Age of Onset   Heart attack Mother    Heart attack Father    Heart attack Paternal Grandmother    Skin cancer Paternal Grandmother     Prior to Admission medications   Medication Sig Start Date End Date Taking? Authorizing Provider  acetaminophen  (TYLENOL ) 650 MG CR tablet Take by mouth every 8 (eight) hours as needed.    Yes [provider]  albuterol  (VENTOLIN   HFA) 108 (90 Base) MCG/ACT inhaler INHALE 2 PUFFS INTO THE LUNGS EVERY 6 HOURS AS NEEDED FOR WHEEZING OR SHORTNESS OF BREATH 06/06/23  Yes Antonette Angeline ORN, NP  aspirin EC 81 MG tablet Take 81 mg by mouth daily. 9 am   Yes [provider]  calcium carbonate (TUMS - DOSED IN MG ELEMENTAL CALCIUM) 500 MG chewable tablet Chew by mouth as needed.   Yes [provider]  cholecalciferol (VITAMIN D3) 25 MCG (1000 UNIT) tablet Take 1,000 Units by mouth daily.   Yes [provider]  furosemide  (LASIX ) 20 MG tablet TAKE 1 TABLET(20 MG) BY MOUTH DAILY 08/14/23  Yes Baity, Angeline ORN, NP  Multiple  Vitamin (MULTIVITAMIN) capsule Take 1 capsule by mouth daily. 9 AM   Yes [provider]  sertraline  (ZOLOFT ) 100 MG tablet Take 1 tablet (100 mg total) by mouth daily. 08/14/23  Yes Baity, Angeline ORN, NP  simvastatin  (ZOCOR ) 20 MG tablet TAKE 1 TABLET(20 MG) BY MOUTH DAILY 08/14/23  Yes Antonette Angeline ORN, NP  lisinopril  (ZESTRIL ) 10 MG tablet TAKE 1 TABLET(10 MG) BY MOUTH DAILY Patient not taking: Reported on 04/27/2024 08/14/23   Antonette Angeline ORN, NP  nystatin  (MYCOSTATIN /NYSTOP ) powder Apply 1 Application topically 3 (three) times daily. Patient not taking: Reported on 04/27/2024 09/16/22   Antonette Angeline ORN, NP  traZODone  (DESYREL ) 50 MG tablet TAKE 1/2 TO 1 TABLET(25 TO 50 MG) BY MOUTH AT BEDTIME AS NEEDED FOR SLEEP Patient not taking: Reported on 04/27/2024 08/14/23   Antonette Angeline ORN, NP    Physical Exam: Vitals:   04/27/24 1758 04/27/24 1849 04/27/24 2000 04/27/24 2100  BP: (!) 154/70  (!) 152/73 (!) 142/61  Pulse: 87     Resp: 18  20 19   Temp:      TempSrc:      SpO2: 97%     Weight:  70.2 kg    Height:  5' 3 (1.6 m)     Physical Exam Vitals and nursing note reviewed.  Constitutional:      General: She is not in acute distress.    Comments: Elderly female in no distress  HENT:     Head: Normocephalic and atraumatic.  Cardiovascular:     Rate and Rhythm: Normal rate and regular rhythm.     Heart sounds: Normal heart sounds.  Pulmonary:     Effort: Pulmonary effort is normal.     Breath sounds: Normal breath sounds.  Abdominal:     Palpations: Abdomen is soft.     Tenderness: There is no abdominal tenderness.  Musculoskeletal:     Comments: See pic below  Neurological:     Mental Status: Mental status is at baseline.        Labs on Admission: I have personally reviewed following labs and imaging studies  CBC: Recent Labs  Lab 04/27/24 1732  WBC 5.2  NEUTROABS 2.4  HGB 11.5*  HCT 35.5*  MCV 90.8  PLT 117*   Basic Metabolic Panel: Recent Labs  Lab  04/27/24 1732  NA 140  K 3.6  CL 101  CO2 28  GLUCOSE 91  BUN 11  CREATININE 0.50  CALCIUM 8.7*   GFR: Estimated Creatinine Clearance: 45.7 mL/min (by C-G formula based on SCr of 0.5 mg/dL). Liver Function Tests: Recent Labs  Lab 04/27/24 1732  AST 24  ALT 13  ALKPHOS 102  BILITOT 1.0  PROT 6.4*  ALBUMIN 2.8*   No results for input(s): LIPASE, AMYLASE in the last 168  hours. No results for input(s): AMMONIA in the last 168 hours. Coagulation Profile: No results for input(s): INR, PROTIME in the last 168 hours. Cardiac Enzymes: No results for input(s): CKTOTAL, CKMB, CKMBINDEX, TROPONINI in the last 168 hours. BNP (last 3 results) No results for input(s): PROBNP in the last 8760 hours. HbA1C: No results for input(s): HGBA1C in the last 72 hours. CBG: No results for input(s): GLUCAP in the last 168 hours. Lipid Profile: No results for input(s): CHOL, HDL, LDLCALC, TRIG, CHOLHDL, LDLDIRECT in the last 72 hours. Thyroid  Function Tests: No results for input(s): TSH, T4TOTAL, FREET4, T3FREE, THYROIDAB in the last 72 hours. Anemia Panel: No results for input(s): VITAMINB12, FOLATE, FERRITIN, TIBC, IRON, RETICCTPCT in the last 72 hours. Urine analysis:    Component Value Date/Time   COLORURINE Yellow 07/16/2014 0949   APPEARANCEUR Cloudy (A) 09/15/2020 1437   LABSPEC 1.019 07/16/2014 0949   PHURINE 5.0 07/16/2014 0949   GLUCOSEU Negative 09/15/2020 1437   GLUCOSEU Negative 07/16/2014 0949   HGBUR Negative 07/16/2014 0949   BILIRUBINUR Negative 12/28/2021 1634   BILIRUBINUR Negative 09/15/2020 1437   BILIRUBINUR Negative 07/16/2014 0949   KETONESUR Negative 07/16/2014 0949   PROTEINUR Positive (A) 12/28/2021 1634   PROTEINUR Negative 09/15/2020 1437   PROTEINUR Negative 07/16/2014 0949   UROBILINOGEN 0.2 12/28/2021 1634   NITRITE Positive 12/28/2021 1634   NITRITE Positive (A) 09/15/2020 1437   NITRITE  Positive 07/16/2014 0949   LEUKOCYTESUR Trace (A) 12/28/2021 1634   LEUKOCYTESUR 2+ (A) 09/15/2020 1437   LEUKOCYTESUR 2+ 07/16/2014 0949    Radiological Exams on Admission: US  LT LOWER EXTREM LTD SOFT TISSUE NON VASCULAR Result Date: 04/27/2024 EXAM: US  left Lower Extremity Nonvascular Soft Tissue Ultrasound TECHNIQUE: Real-time ultrasound scan of the left lower extremity with image documentation. COMPARISON: None provided. CLINICAL HISTORY: 407327 Wound check, abscess [id number]. Wound check, abscess [id number]. FINDINGS: SOFT TISSUES: Targeted ultrasound of the left lower extremity in the area of clinical concern on the anterior mid lower leg demonstrates an irregular hypoechoic fluid collection measuring approximately 4.5 x 1.6 x 4.9 cm. Adjacent hyperemia. Findings are compatible with abscess. No solid mass. IMPRESSION: 1. 4.9 cm abscess  in the left anterior mid lower leg, compatible with abscess. Electronically signed by: Norman Gatlin MD 04/27/2024 08:01 PM EDT RP Workstation: [id number]   US  Venous Img Lower Unilateral Left Result Date: 04/27/2024 CLINICAL DATA:  Lower extremity edema EXAM: LEFT LOWER EXTREMITY VENOUS DOPPLER ULTRASOUND TECHNIQUE: Gray-scale sonography with graded compression, as well as color Doppler and duplex ultrasound were performed to evaluate the lower extremity deep venous systems from the level of the common femoral vein and including the common femoral, femoral, profunda femoral, popliteal and calf veins including the posterior tibial, peroneal and gastrocnemius veins when visible. The superficial great saphenous vein was also interrogated. Spectral Doppler was utilized to evaluate flow at rest and with distal augmentation maneuvers in the common femoral, femoral and popliteal veins. COMPARISON:  None Available. FINDINGS: Contralateral Common Femoral Vein: No thrombus. Normal compressibility and augmentation. Common Femoral Vein: Nonocclusive thrombus. Saphenofemoral Junction:  No evidence of thrombus. Normal compressibility and flow on color Doppler imaging. Profunda Femoral Vein: No evidence of thrombus. Normal compressibility and flow on color Doppler imaging. Femoral Vein: Nonocclusive thrombus. Popliteal Vein: Nonocclusive thrombus. Calf Veins: Not visualized Superficial Great Saphenous Vein: Not visualized. Venous Reflux:  None. Other Findings:  None. IMPRESSION: Nonocclusive thrombus noted in the left common femoral, superficial femoral and popliteal veins. Electronically Signed   By: Franky  Dover M.D.   On: 04/27/2024 19:59   DG Tibia/Fibula Left Result Date: 04/27/2024 CLINICAL DATA:  edema, swelling, dvt exclude, eval for deep infection EXAM: LEFT TIBIA AND FIBULA - 2 VIEW COMPARISON:  X-ray left knee 07/28/2014 FINDINGS: No cortical erosion or destruction. Total left knee arthroplasty. There is no evidence of fracture or other focal bone lesions. Diffusely decreased bone density of the bones of the leg and ankle. Diffusely decreased bone density. IMPRESSION: 1. Diffuse subcutaneus soft tissue edema. 2.  No radiographic findings to suggest osteomyelitis. 3. No acute displaced fracture or dislocation. 4. Total left knee arthroplasty. 5. Diffusely decreased bone density. Electronically Signed   By: Morgane  Naveau M.D.   On: 04/27/2024 19:55   Data Reviewed for HPI: Relevant notes from primary care and specialist visits, past discharge summaries as available in EHR, including Care Everywhere. Prior diagnostic testing as pertinent to current admission diagnoses Updated medications and problem lists for reconciliation ED course, including vitals, labs, imaging, treatment and response to treatment Triage notes, nursing and pharmacy notes and ED provider's notes Notable results as noted above in HPI      Assessment and Plan: * Cellulitis and abscess of left leg Abscess spontaneously draining Continue Rocephin and vancomycin Follow cultures Surgery consulted from  the ED N.p.o. from midnight per surgical request Pain control Formal surgical consult  Acute nonocclusive DVT of left common femoral, superficial femoral and popliteal veins  (HCC) Likely provoked as patient is currently nonambulant/bedbound Continue heparin infusion Hold home aspirin to decrease bleeding risk DVT is nonocclusive: Can consider vascular consult for opinion on need for procedure   History of multiple strokes Holding aspirin Does not appear to be on statins-listed lovastatin allergy  Mixed Alzheimer's and vascular dementia (HCC) Frailty/advanced age/nonambulant x 4 months, previously ambulated with walker Depression Continue sertraline , trazodone  Spoke with son Cyndee: Patient has 24-hour caregiver.  Does not want hospice. Open to home health(says he already has all the equipment he needs in the home) [facility] consult    Hypertension Currently takes furosemide  which I will hold We will consider resuming lisinopril  which was previously discontinued-no listed allergy         DVT prophylaxis: Full dose heparin  Consults: Surgery  Advance Care Planning:   Code Status: Not on file  Family Communication: Son Cyndee on phone, another son at bedside  Disposition Plan: Back to previous home environment  Severity of Illness: The appropriate patient status for this patient is OBSERVATION. Observation status is judged to be reasonable and necessary in order to provide the required intensity of service to ensure the patient's safety. The patient's presenting symptoms, physical exam findings, and initial radiographic and laboratory data in the context of their medical condition is felt to place them at decreased risk for further clinical deterioration. Furthermore, it is anticipated that the patient will be medically stable for discharge from the hospital within 2 midnights of admission.   Author: Delayne LULLA Solian, MD 04/27/2024 10:06 PM  For on call review  [email or web address].

## 2024-04-27 NOTE — Assessment & Plan Note (Addendum)
 Frailty/advanced age/nonambulant x 4 months, previously ambulated with walker Depression Continue sertraline , trazodone  Spoke with son Cyndee: Patient has 24-hour caregiver.  Does not want hospice. Open to home health(says he already has all the equipment he needs in the home) [facility] consult

## 2024-04-27 NOTE — ED Notes (Signed)
 This RN attempted to get [facility] X2. Only able to obtain blue top.

## 2024-04-27 NOTE — Progress Notes (Signed)
 Pharmacy Antibiotic Note  Susan Campos is a 89 y.o. female admitted on 04/27/2024 with cellulitis with abscess.  Pharmacy has been consulted for Vancomycin dosing.  Plan: Vancomycin 1500 mg IV X 1 given in ED on 9/27 @ 1954. Vancomycin 1 gm IV Q24H ordered to start on 9/28 @ 2000.  AUC = 523.8 Vanc trough = 13.9   Height: 5' 3 (160 cm) Weight: 70.2 kg (154 lb 12.2 oz) IBW/kg (Calculated) : 52.4  Temp (24hrs), Avg:98.9 F (37.2 C), Min:98.9 F (37.2 C), Max:98.9 F (37.2 C)  Recent Labs  Lab 04/27/24 1732 04/27/24 2014  WBC 5.2  --   CREATININE 0.50  --   LATICACIDVEN 0.8 1.0    Estimated Creatinine Clearance: 45.7 mL/min (by C-G formula based on SCr of 0.5 mg/dL).    Allergies  Allergen Reactions   Lovastatin Shortness Of Breath and Palpitations   Aricept [Donepezil Hcl]     dizziness    Antimicrobials this admission:   >>    >>   Dose adjustments this admission:   Microbiology results:  BCx:   UCx:    Sputum:    MRSA PCR:   Thank you for allowing pharmacy to be a part of this patient's care.  Cedric Mcclaine D 04/27/2024 10:36 PM

## 2024-04-27 NOTE — ED Triage Notes (Signed)
 See First RN note. Pt noted to have reddness and swelling to LLE.  Area is draining serosanguinous fluid.  Pt's son reports she has been bed bound x4 months.

## 2024-04-27 NOTE — ED Notes (Signed)
1 set cultures sent.

## 2024-04-27 NOTE — Assessment & Plan Note (Addendum)
 Holding aspirin Does not appear to be on statins-listed lovastatin allergy

## 2024-04-27 NOTE — Consult Note (Signed)
 PHARMACY - BRIEF ANTIBIOTIC NOTE   Pharmacy has received consult(s) for vancomycin from an ED provider. The patient's profile has been reviewed for ht/wt/allergies/indication/available labs.    One time order(s) placed for: vancomycin 1500mg   Further antibiotics/pharmacy consults should be ordered by admitting physician if indicated.                       Thank you,  Will M. Lenon, PharmD, BCPS Clinical Pharmacist 04/27/2024 6:52 PM

## 2024-04-27 NOTE — ED Notes (Signed)
 First nurse note:  Form home, [facility] for wound check to LLE. Son noticed wound this AM. Per EMS, area appears 'black'. Pt is bedbound at baseline. Alert/oriented to self only, this is her normal baseline. Pt is DNR but does not have paper with her. No hx DM per EMS.    EMS VS: 197/96, 94% RA, 97.9 axillary, HR 85

## 2024-04-27 NOTE — Progress Notes (Signed)
 PHARMACY - ANTICOAGULATION CONSULT NOTE  Pharmacy Consult for Heparin  Indication: DVT  Allergies  Allergen Reactions   Lovastatin Shortness Of Breath and Palpitations   Aricept [Donepezil Hcl]     dizziness    Patient Measurements: Height: 5' 3 (160 cm) Weight: 70.2 kg (154 lb 12.2 oz) IBW/kg (Calculated) : 52.4 HEPARIN DW (KG): 66.9  Vital Signs: Temp: 98.9 F (37.2 C) (09/27 1708) Temp Source: Oral (09/27 1708) BP: 154/70 (09/27 1758) Pulse Rate: 87 (09/27 1758)  Labs: Recent Labs    04/27/24 1732  HGB 11.5*  HCT 35.5*  PLT 117*  CREATININE 0.50    Estimated Creatinine Clearance: 45.7 mL/min (by C-G formula based on SCr of 0.5 mg/dL).   Medical History: Past Medical History:  Diagnosis Date   Arthritis    HIPS   Asthma    HX OF   Cancer (HCC)    skin   Hypertension    Orthopnea    sleeps in recliner, cannot lay flat on her back   Stroke [facility])    CVA 2012/ MEMORY LOSS, 1999, 1 more in 2011   Vertigo    OCCASIONAL    Medications:  (Not in a hospital admission)   Assessment: Pharmacy consulted to dose heparin in this 89 year old female admitted with DVT.  No prior anticoag noted. CrCl = 45.7 ml/min  Goal of Therapy:  Heparin level 0.3-0.7 units/ml Monitor platelets by anticoagulation protocol: Yes   Plan:  Give 4000 units bolus x 1 Start heparin infusion at 1150 units/hr Check anti-Xa level in 8 hours and daily while on heparin Continue to monitor H&H and platelets  Gustavo Meditz D 04/27/2024,9:34 PM

## 2024-04-27 NOTE — IPAL (Signed)
  Interdisciplinary Goals of Care Family Meeting   Date carried out: 04/27/2024  Location of the meeting: Phone conference  Member's involved: Physician and Family Member or next of kin  Durable Power of Attorney or acting medical decision maker: Cyndee, son and POA    Discussion: We discussed goals of care for [employer] .   I have reviewed medical records including EPIC notes, labs and imaging, .discussed major active diagnoses, plan of care, natural trajectory, prognosis, GOC, EOL wishes, disposition and options including Full code/DNI/DNR and the concept of comfort care if DNR is elected. Questions and concerns were addressed. They are  in agreement to continue current plan of care . Election for DNR/DNI status.   Code status:   Code Status: Limited: Do not attempt resuscitation (DNR) -DNR-LIMITED -Do Not Intubate/DNI    Disposition: Continue current acute care  Time spent for the meeting: 30    Delayne LULLA Solian, MD  04/27/2024, 10:23 PM

## 2024-04-28 ENCOUNTER — Other Ambulatory Visit: Payer: Self-pay

## 2024-04-28 ENCOUNTER — Observation Stay: Admitting: Anesthesiology

## 2024-04-28 ENCOUNTER — Encounter
Admission: EM | Disposition: A | Discharge status: Hospice | Payer: Self-pay | Source: Home / Self Care | Attending: Internal Medicine

## 2024-04-28 DIAGNOSIS — F32A Depression, unspecified: Secondary | ICD-10-CM | POA: Diagnosis present

## 2024-04-28 DIAGNOSIS — Z7189 Other specified counseling: Secondary | ICD-10-CM | POA: Diagnosis not present

## 2024-04-28 DIAGNOSIS — Z792 Long term (current) use of antibiotics: Secondary | ICD-10-CM

## 2024-04-28 DIAGNOSIS — F0283 Dementia in other diseases classified elsewhere, unspecified severity, with mood disturbance: Secondary | ICD-10-CM | POA: Diagnosis present

## 2024-04-28 DIAGNOSIS — Z723 Lack of physical exercise: Secondary | ICD-10-CM | POA: Diagnosis not present

## 2024-04-28 DIAGNOSIS — Z85820 Personal history of malignant melanoma of skin: Secondary | ICD-10-CM | POA: Diagnosis not present

## 2024-04-28 DIAGNOSIS — Z66 Do not resuscitate: Secondary | ICD-10-CM | POA: Diagnosis present

## 2024-04-28 DIAGNOSIS — J45909 Unspecified asthma, uncomplicated: Secondary | ICD-10-CM | POA: Diagnosis present

## 2024-04-28 DIAGNOSIS — R54 Age-related physical debility: Secondary | ICD-10-CM | POA: Diagnosis present

## 2024-04-28 DIAGNOSIS — Z8249 Family history of ischemic heart disease and other diseases of the circulatory system: Secondary | ICD-10-CM | POA: Diagnosis not present

## 2024-04-28 DIAGNOSIS — L89151 Pressure ulcer of sacral region, stage 1: Secondary | ICD-10-CM | POA: Diagnosis present

## 2024-04-28 DIAGNOSIS — I82432 Acute embolism and thrombosis of left popliteal vein: Secondary | ICD-10-CM

## 2024-04-28 DIAGNOSIS — F0153 Vascular dementia, unspecified severity, with mood disturbance: Secondary | ICD-10-CM | POA: Diagnosis present

## 2024-04-28 DIAGNOSIS — L03116 Cellulitis of left lower limb: Secondary | ICD-10-CM | POA: Diagnosis present

## 2024-04-28 DIAGNOSIS — E785 Hyperlipidemia, unspecified: Secondary | ICD-10-CM | POA: Diagnosis present

## 2024-04-28 DIAGNOSIS — I8290 Acute embolism and thrombosis of unspecified vein: Secondary | ICD-10-CM | POA: Diagnosis not present

## 2024-04-28 DIAGNOSIS — Z515 Encounter for palliative care: Secondary | ICD-10-CM | POA: Diagnosis not present

## 2024-04-28 DIAGNOSIS — L02416 Cutaneous abscess of left lower limb: Secondary | ICD-10-CM | POA: Diagnosis not present

## 2024-04-28 DIAGNOSIS — M7989 Other specified soft tissue disorders: Secondary | ICD-10-CM | POA: Diagnosis present

## 2024-04-28 DIAGNOSIS — Z96653 Presence of artificial knee joint, bilateral: Secondary | ICD-10-CM | POA: Diagnosis present

## 2024-04-28 DIAGNOSIS — Z87891 Personal history of nicotine dependence: Secondary | ICD-10-CM | POA: Diagnosis not present

## 2024-04-28 DIAGNOSIS — Z8673 Personal history of transient ischemic attack (TIA), and cerebral infarction without residual deficits: Secondary | ICD-10-CM

## 2024-04-28 DIAGNOSIS — I82402 Acute embolism and thrombosis of unspecified deep veins of left lower extremity: Secondary | ICD-10-CM | POA: Diagnosis present

## 2024-04-28 DIAGNOSIS — I82412 Acute embolism and thrombosis of left femoral vein: Secondary | ICD-10-CM

## 2024-04-28 DIAGNOSIS — G309 Alzheimer's disease, unspecified: Secondary | ICD-10-CM | POA: Diagnosis present

## 2024-04-28 DIAGNOSIS — Z7401 Bed confinement status: Secondary | ICD-10-CM | POA: Diagnosis not present

## 2024-04-28 DIAGNOSIS — F17210 Nicotine dependence, cigarettes, uncomplicated: Secondary | ICD-10-CM | POA: Diagnosis present

## 2024-04-28 DIAGNOSIS — L0291 Cutaneous abscess, unspecified: Secondary | ICD-10-CM

## 2024-04-28 DIAGNOSIS — Z79899 Other long term (current) drug therapy: Secondary | ICD-10-CM

## 2024-04-28 DIAGNOSIS — Z961 Presence of intraocular lens: Secondary | ICD-10-CM | POA: Diagnosis present

## 2024-04-28 DIAGNOSIS — I10 Essential (primary) hypertension: Secondary | ICD-10-CM | POA: Diagnosis present

## 2024-04-28 HISTORY — PX: INCISION AND DRAINAGE ABSCESS: SHX5864

## 2024-04-28 LAB — HEPARIN LEVEL (UNFRACTIONATED)
Heparin Unfractionated: 0.49 [IU]/mL (ref 0.30–0.70)
Heparin Unfractionated: 0.59 [IU]/mL (ref 0.30–0.70)

## 2024-04-28 LAB — CREATININE, SERUM
Creatinine, Ser: 0.51 mg/dL (ref 0.44–1.00)
GFR, Estimated: >60 mL/min (ref >60)

## 2024-04-28 SURGERY — INCISION AND DRAINAGE, ABSCESS
Anesthesia: General | Site: Leg Lower | Laterality: Left

## 2024-04-28 MED ORDER — PHENYLEPHRINE 80 MCG/ML (10ML) SYRINGE FOR IV PUSH (FOR BLOOD PRESSURE SUPPORT)
PREFILLED_SYRINGE | INTRAVENOUS | Status: AC
  Filled 2024-04-28: qty 10

## 2024-04-28 MED ORDER — PROPOFOL 10 MG/ML IV BOLUS
INTRAVENOUS | Status: AC
  Filled 2024-04-28: qty 40

## 2024-04-28 MED ORDER — 0.9 % SODIUM CHLORIDE (POUR BTL) OPTIME
TOPICAL | Status: DC | PRN
  Administered 2024-04-28: 1000 mL

## 2024-04-28 MED ORDER — ONDANSETRON HCL 4 MG/2ML IJ SOLN
INTRAMUSCULAR | Status: DC | PRN
  Administered 2024-04-28: 4 mg via INTRAVENOUS

## 2024-04-28 MED ORDER — BUPIVACAINE-EPINEPHRINE 0.25% -1:200000 IJ SOLN
INTRAMUSCULAR | Status: DC | PRN
  Administered 2024-04-28: 6 mL

## 2024-04-28 MED ORDER — FENTANYL CITRATE (PF) 100 MCG/2ML IJ SOLN
INTRAMUSCULAR | Status: AC
  Filled 2024-04-28: qty 2

## 2024-04-28 MED ORDER — ONDANSETRON HCL 4 MG/2ML IJ SOLN
INTRAMUSCULAR | Status: AC
  Filled 2024-04-28: qty 2

## 2024-04-28 MED ORDER — FENTANYL CITRATE (PF) 100 MCG/2ML IJ SOLN
INTRAMUSCULAR | Status: DC | PRN
  Administered 2024-04-28: 50 ug via INTRAVENOUS

## 2024-04-28 MED ORDER — BUPIVACAINE-EPINEPHRINE (PF) 0.25% -1:200000 IJ SOLN
INTRAMUSCULAR | Status: AC
  Filled 2024-04-28: qty 30

## 2024-04-28 MED ORDER — PHENYLEPHRINE 80 MCG/ML (10ML) SYRINGE FOR IV PUSH (FOR BLOOD PRESSURE SUPPORT)
PREFILLED_SYRINGE | INTRAVENOUS | Status: DC | PRN
  Administered 2024-04-28: 80 ug via INTRAVENOUS

## 2024-04-28 MED ORDER — PROPOFOL 10 MG/ML IV BOLUS
INTRAVENOUS | Status: DC | PRN
  Administered 2024-04-28: 20 mg via INTRAVENOUS
  Administered 2024-04-28: 10 mg via INTRAVENOUS

## 2024-04-28 MED ORDER — LACTATED RINGERS IV SOLN: INTRAVENOUS | Status: DC | PRN

## 2024-04-28 MED ORDER — CEFAZOLIN SODIUM 1 G IJ SOLR
INTRAMUSCULAR | Status: AC
  Filled 2024-04-28: qty 20

## 2024-04-28 MED ORDER — VITAMIN D 25 MCG (1000 UNIT) PO TABS
1000.0000 [IU] | ORAL_TABLET | Freq: Every day | ORAL | Status: DC
  Administered 2024-04-29 – 2024-04-30 (×2): 1000 [IU] via ORAL
  Filled 2024-04-28 (×2): qty 1

## 2024-04-28 MED ORDER — ADULT MULTIVITAMIN W/MINERALS CH
1.0000 | ORAL_TABLET | Freq: Every day | ORAL | Status: DC
  Administered 2024-04-29 – 2024-04-30 (×2): 1 via ORAL
  Filled 2024-04-28 (×2): qty 1

## 2024-04-28 MED ORDER — PROPOFOL 500 MG/50ML IV EMUL
INTRAVENOUS | Status: DC | PRN
  Administered 2024-04-28: 100 ug/kg/min via INTRAVENOUS

## 2024-04-28 SURGICAL SUPPLY — 29 items
BLADE SURG 15 STRL LF DISP TIS (BLADE) ×1 IMPLANT
BNDG ELASTIC 4X5.8 VLCR NS LF (GAUZE/BANDAGES/DRESSINGS) IMPLANT
BNDG STRETCH 4X75 STRL LF (GAUZE/BANDAGES/DRESSINGS) IMPLANT
DRAIN PENROSE 12X.25 LTX STRL (MISCELLANEOUS) IMPLANT
DRAIN PENROSE 5/8X18 LTX STRL (DRAIN) IMPLANT
DRAPE LAPAROTOMY 77X122 PED (DRAPES) ×1 IMPLANT
DRAPE SHEET LG 3/4 BI-LAMINATE (DRAPES) IMPLANT
ELECT CAUTERY BLADE 6.4 (BLADE) ×1 IMPLANT
ELECTRODE REM PT RTRN 9FT ADLT (ELECTROSURGICAL) ×1 IMPLANT
GAUZE PACKING IODOFORM 1/2INX (GAUZE/BANDAGES/DRESSINGS) IMPLANT
GAUZE SPONGE 4X4 12PLY STRL (GAUZE/BANDAGES/DRESSINGS) IMPLANT
GLOVE ORTHO TXT STRL SZ7.5 (GLOVE) ×1 IMPLANT
GOWN STRL REUS W/ TWL LRG LVL3 (GOWN DISPOSABLE) ×1 IMPLANT
GOWN STRL REUS W/ TWL XL LVL3 (GOWN DISPOSABLE) ×1 IMPLANT
KIT TURNOVER KIT A (KITS) ×1 IMPLANT
MANIFOLD NEPTUNE II (INSTRUMENTS) ×1 IMPLANT
NDL HYPO 22X1.5 SAFETY MO (MISCELLANEOUS) IMPLANT
NEEDLE HYPO 22X1.5 SAFETY MO (MISCELLANEOUS) IMPLANT
PACK BASIN MINOR ARMC (MISCELLANEOUS) ×1 IMPLANT
PACKING GAUZE IODOFORM 1INX5YD (GAUZE/BANDAGES/DRESSINGS) IMPLANT
PAD ABD DERMACEA PRESS 5X9 (GAUZE/BANDAGES/DRESSINGS) IMPLANT
SOLN 0.9% NACL 1000 ML (IV SOLUTION) ×1 IMPLANT
SOLN 0.9% NACL POUR BTL 1000ML (IV SOLUTION) ×1 IMPLANT
SOLUTION PREP PVP 2OZ (MISCELLANEOUS) ×1 IMPLANT
SPONGE T-LAP 18X18 ~~LOC~~+RFID (SPONGE) IMPLANT
SUTURE EHLN 3-0 FS-10 30 BLK (SUTURE) IMPLANT
SWAB CULTURE AMIES ANAERIB BLU (MISCELLANEOUS) IMPLANT
SYR 10ML LL (SYRINGE) IMPLANT
TRAP FLUID SMOKE EVACUATOR (MISCELLANEOUS) ×1 IMPLANT

## 2024-04-28 NOTE — Anesthesia Preprocedure Evaluation (Addendum)
 Anesthesia Evaluation  Patient identified by MRN, date of birth, ID band Patient awake    Reviewed: Allergy & Precautions, H&P , NPO status , Patient's Chart, lab work & pertinent test results  Airway Mallampati: II  TM Distance: >3 FB Neck ROM: full    Dental no notable dental hx.    Pulmonary asthma , former smoker   Pulmonary exam normal        Cardiovascular hypertension,  Rhythm:Irregular Rate:Normal     Neuro/Psych  PSYCHIATRIC DISORDERS     Dementia Mixed Alzheimer's and vascular dementia (HCC) Depression CVA    GI/Hepatic negative GI ROS, Neg liver ROS,,,  Endo/Other  negative endocrine ROS    Renal/GU negative Renal ROS  negative genitourinary   Musculoskeletal   Abdominal   Peds  Hematology  (+) Blood dyscrasia, anemia thrombocytopenia   Anesthesia Other Findings Extensive LEFT- CFV, SFV and popliteal vein  DVT 2. LEFT leg Abscess- 5cm  Frailty/advanced age/nonambulant x 4 months, previously ambulated with walker  Past Medical History: No date: Arthritis     Comment:  HIPS No date: Asthma     Comment:  HX OF No date: Cancer (HCC)     Comment:  skin No date: Hypertension No date: Orthopnea     Comment:  sleeps in recliner, cannot lay flat on her back No date: Stroke [facility])     Comment:  CVA 2012/ MEMORY LOSS, 1999, 1 more in 2011 No date: Vertigo     Comment:  OCCASIONAL  Past Surgical History: No date: CATARACT EXTRACTION; Right 05/27/2015: CATARACT EXTRACTION W/PHACO; Left     Comment:  Procedure: CATARACT EXTRACTION PHACO AND INTRAOCULAR               LENS PLACEMENT (IOC);  Surgeon: Dene Etienne, MD;               Location: [facility];  Service: Ophthalmology;                Laterality: Left; No date: KNEE ARTHROSCOPY No date: MELANOMA EXCISION     Comment:  from chest No date: MOUTH SURGERY No date: REPLACEMENT TOTAL KNEE BILATERAL No date: UTERINE SUSPENSION  BMI     Body Mass Index: 27.42 kg/m      Reproductive/Obstetrics negative OB ROS                              Anesthesia Physical Anesthesia Plan  ASA: 3  Anesthesia Plan: General   Post-op Pain Management: Minimal or no pain anticipated   Induction: Intravenous  PONV Risk Score and Plan: Propofol infusion and TIVA  Airway Management Planned: Natural Airway  Additional Equipment:   Intra-op Plan:   Post-operative Plan:   Informed Consent: I have reviewed the patients History and Physical, chart, labs and discussed the procedure including the risks, benefits and alternatives for the proposed anesthesia with the patient or authorized representative who has indicated his/her understanding and acceptance.   Patient has DNR.  Discussed DNR with power of attorney and Continue DNR.   Dental Advisory Given and Consent reviewed with POA  Plan Discussed with: CRNA and Surgeon  Anesthesia Plan Comments:          Anesthesia Quick Evaluation

## 2024-04-28 NOTE — Consult Note (Signed)
 [facility] SURGICAL CONSULTATION NOTE (initial) - cpt: 99254  HISTORY OF PRESENT ILLNESS (HPI):  89 y.o. female presented to [facility] ED yesterday evening for evaluation of an area of redness and swelling that has progressed with subsequent drainage on the day prior to her presentation. Patient reports marked tenderness and pain in the area, she denies fevers or chills.  She was found to have a nonocclusive thrombus in the left common femoral superficial femoral and popliteal veins and start was started on heparin.  Hospitalist admitted for IV antibiotics.  She has been kept n.p.o. overnight for possible I&D as there was a fluid collection on ultrasound felt to be abscess, overnight the area of redness seems to diminish from the marked outline.  Surgery is consulted by ED physician Dr. Dicky in this context for evaluation and management of cellulitis/abscess.  PAST MEDICAL HISTORY (PMH):  Past Medical History:  Diagnosis Date   Arthritis    HIPS   Asthma    HX OF   Cancer (HCC)    skin   Hypertension    Orthopnea    sleeps in recliner, cannot lay flat on her back   Stroke [facility])    CVA 2012/ MEMORY LOSS, 1999, 1 more in 2011   Vertigo    OCCASIONAL     PAST SURGICAL HISTORY (PSH):  Past Surgical History:  Procedure Laterality Date   CATARACT EXTRACTION Right    CATARACT EXTRACTION W/PHACO Left 05/27/2015   Procedure: CATARACT EXTRACTION PHACO AND INTRAOCULAR LENS PLACEMENT (IOC);  Surgeon: Dene Etienne, MD;  Location: [facility];  Service: Ophthalmology;  Laterality: Left;   KNEE ARTHROSCOPY     MELANOMA EXCISION     from chest   MOUTH SURGERY     REPLACEMENT TOTAL KNEE BILATERAL     UTERINE SUSPENSION       MEDICATIONS:  Prior to Admission medications   Medication Sig Start Date End Date Taking? Authorizing Provider  acetaminophen  (TYLENOL ) 650 MG CR tablet Take by mouth every 8 (eight) hours as needed.    Yes [provider]   albuterol  (VENTOLIN  HFA) 108 (90 Base) MCG/ACT inhaler INHALE 2 PUFFS INTO THE LUNGS EVERY 6 HOURS AS NEEDED FOR WHEEZING OR SHORTNESS OF BREATH 06/06/23  Yes Antonette Angeline ORN, NP  aspirin EC 81 MG tablet Take 81 mg by mouth daily. 9 am   Yes [provider]  calcium carbonate (TUMS - DOSED IN MG ELEMENTAL CALCIUM) 500 MG chewable tablet Chew by mouth as needed.   Yes [provider]  cholecalciferol (VITAMIN D3) 25 MCG (1000 UNIT) tablet Take 1,000 Units by mouth daily.   Yes [provider]  furosemide  (LASIX ) 20 MG tablet TAKE 1 TABLET(20 MG) BY MOUTH DAILY 08/14/23  Yes Baity, Angeline ORN, NP  Multiple Vitamin (MULTIVITAMIN) capsule Take 1 capsule by mouth daily. 9 AM   Yes [provider]  sertraline  (ZOLOFT ) 100 MG tablet Take 1 tablet (100 mg total) by mouth daily. 08/14/23  Yes Baity, Angeline ORN, NP  simvastatin  (ZOCOR ) 20 MG tablet TAKE 1 TABLET(20 MG) BY MOUTH DAILY 08/14/23  Yes Antonette Angeline ORN, NP  lisinopril  (ZESTRIL ) 10 MG tablet TAKE 1 TABLET(10 MG) BY MOUTH DAILY Patient not taking: Reported on 04/27/2024 08/14/23   Antonette Angeline ORN, NP  nystatin  (MYCOSTATIN /NYSTOP ) powder Apply 1 Application topically 3 (three) times daily. Patient not taking: Reported on 04/27/2024 09/16/22   Antonette Angeline ORN, NP  traZODone  (DESYREL ) 50 MG tablet TAKE 1/2 TO 1  TABLET(25 TO 50 MG) BY MOUTH AT BEDTIME AS NEEDED FOR SLEEP Patient not taking: Reported on 04/27/2024 08/14/23   Antonette Angeline ORN, NP     ALLERGIES:  Allergies  Allergen Reactions   Lovastatin Shortness Of Breath and Palpitations   Aricept [Donepezil Hcl]     dizziness     SOCIAL HISTORY:  Social History   Socioeconomic History   Marital status: Married    Spouse name: Not on file   Number of children: 2   Years of education: Not on file   Highest education level: Associate degree: occupational, Scientist, product/process development, or vocational program  Occupational History   Occupation: Retired  Tobacco Use   Smoking status:  Former    Current packs/day: 0.50    Average packs/day: 0.5 packs/day for 4.0 years (2.0 ttl pk-yrs)    Types: Cigarettes   Smokeless tobacco: Never   Tobacco comments:    1979  Vaping Use   Vaping status: Never Used  Substance and Sexual Activity   Alcohol use: No   Drug use: No   Sexual activity: Not on file  Other Topics Concern   Not on file  Social History Narrative   Not on file   Social Drivers of Health   Financial Resource Strain: Low Risk  (06/16/2023)   Overall Financial Resource Strain (CARDIA)    Difficulty of Paying Living Expenses: Not hard at all  Food Insecurity: No Food Insecurity (06/16/2023)   Hunger Vital Sign    Worried About Running Out of Food in the Last Year: Never true    Ran Out of Food in the Last Year: Never true  Transportation Needs: No Transportation Needs (06/16/2023)   PRAPARE - Administrator, Civil Service (Medical): No    Lack of Transportation (Non-Medical): No  Physical Activity: Inactive (06/16/2023)   Exercise Vital Sign    Days of Exercise per Week: 0 days    Minutes of Exercise per Session: 0 min  Stress: No Stress Concern Present (06/16/2023)   [employer] of Occupational Health - Occupational Stress Questionnaire    Feeling of Stress : Not at all  Social Connections: Socially Isolated (06/16/2023)   Social Connection and Isolation Panel    Frequency of Communication with Friends and Family: Twice a week    Frequency of Social Gatherings with Friends and Family: More than three times a week    Attends Religious Services: Never    Database administrator or Organizations: No    Attends Banker Meetings: Never    Marital Status: Widowed  Intimate Partner Violence: Not At Risk (06/16/2023)   Humiliation, Afraid, Rape, and Kick questionnaire    Fear of Current or Ex-Partner: No    Emotionally Abused: No    Physically Abused: No    Sexually Abused: No     FAMILY HISTORY:  Family History   Problem Relation Age of Onset   Heart attack Mother    Heart attack Father    Heart attack Paternal Grandmother    Skin cancer Paternal Grandmother       REVIEW OF SYSTEMS:  Review of Systems  All other systems reviewed and are negative.   VITAL SIGNS:  Temp:  [98.5 F (36.9 C)-98.9 F (37.2 C)] 98.5 F (36.9 C) (09/28 0616) Pulse Rate:  [68-87] 73 (09/28 1045) Resp:  [15-20] 15 (09/28 1045) BP: (122-159)/(56-73) 122/59 (09/28 1045) SpO2:  [91 %-98 %] 94 % (09/28 1045) Weight:  [70.2 kg] 70.2 kg (  09/27 1849)     Height: 5' 3 (160 cm) Weight: 70.2 kg BMI (Calculated): 27.42   INTAKE/OUTPUT:  09/27 0701 - 09/28 0700 In: 400 [IV Piggyback:400] Out: -   PHYSICAL EXAM:  Physical Exam Blood pressure (!) 122/59, pulse 73, temperature 98.5 F (36.9 C), temperature source Oral, resp. rate 15, height 5' 3 (1.6 m), weight 70.2 kg, SpO2 94%. Last Weight  Most recent update: 04/27/2024  6:49 PM    Weight  70.2 kg (154 lb 12.2 oz)             CONSTITUTIONAL: Well developed, and nourished, appropriately responsive and aware without distress.  Elderly somewhat frail-appearing lady who may be slow to answer questions but does so in her timing and appropriately.  Her son is at bedside who is hard of hearing requiring lipreading to assist him. EYES: Sclera non-icteric.   EARS, NOSE, MOUTH AND THROAT:  The oropharynx is clear. Oral mucosa is pink and moist.   Hearing is intact to voice.  NECK: Trachea is midline, and there is no jugular venous distension.  LYMPH NODES:  Lymph nodes in the neck are not enlarged. RESPIRATORY:   Normal respiratory effort without pathologic use of accessory muscles. CARDIOVASCULAR:  Well perfused.  GI: The abdomen is  soft, nontender, and nondistended. There were no palpable masses.  MUSCULOSKELETAL:  Symmetrical muscle tone appreciated in all four extremities. Left lateral calf is notable for this area of hyperemia, with a centralized area that is  somewhat decompressed with a small point of drainage.  It is markedly tender to touch, consistent with the area noted on ultrasound.     Warm without edema.  SKIN: Skin turgor is normal. No pathologic skin lesions appreciated.  NEUROLOGIC: Nonfocal PSYCH:  Alert and oriented to person, place and time. Affect is appropriate for situation.  Data Reviewed I have personally reviewed what is currently available of the patient's imaging, recent labs and medical records.    Labs:     Latest Ref Rng & Units 04/27/2024    5:32 PM 09/16/2022    1:42 PM 05/03/2022    3:17 PM  CBC  WBC 4.0 - 10.5 K/uL 5.2  4.0  5.3   Hemoglobin 12.0 - 15.0 g/dL 88.4  87.0  86.8   Hematocrit 36.0 - 46.0 % 35.5  37.8  38.3   Platelets 150 - 400 K/uL 117  139  142       Latest Ref Rng & Units 04/27/2024    5:32 PM 09/16/2022    1:42 PM 05/03/2022    3:17 PM  CMP  Glucose 70 - 99 mg/dL 91  77  76   BUN 8 - 23 mg/dL 11  14  16    Creatinine 0.44 - 1.00 mg/dL 9.49  9.04  9.09   Sodium 135 - 145 mmol/L 140  145  145   Potassium 3.5 - 5.1 mmol/L 3.6  3.9  3.8   Chloride 98 - 111 mmol/L 101  107  110   CO2 22 - 32 mmol/L 28  30  28    Calcium 8.9 - 10.3 mg/dL 8.7  9.3  9.4   Total Protein 6.5 - 8.1 g/dL 6.4  6.9  6.8   Total Bilirubin 0.0 - 1.2 mg/dL 1.0  0.7  0.5   Alkaline Phos 38 - 126 U/L 102     AST 15 - 41 U/L 24  11  10    ALT 0 - 44 U/L 13  8  8   Gram stain without specific results.  Cultures pending.   Imaging studies:   Last 24 hrs: US  LT LOWER EXTREM LTD SOFT TISSUE NON VASCULAR Result Date: 04/27/2024 EXAM: US  left Lower Extremity Nonvascular Soft Tissue Ultrasound TECHNIQUE: Real-time ultrasound scan of the left lower extremity with image documentation. COMPARISON: None provided. CLINICAL HISTORY: 407327 Wound check, abscess [id number]. Wound check, abscess [id number]. FINDINGS: SOFT TISSUES: Targeted ultrasound of the left lower extremity in the area of clinical concern on the anterior mid lower leg  demonstrates an irregular hypoechoic fluid collection measuring approximately 4.5 x 1.6 x 4.9 cm. Adjacent hyperemia. Findings are compatible with abscess. No solid mass. IMPRESSION: 1. 4.9 cm abscess  in the left anterior mid lower leg, compatible with abscess. Electronically signed by: Norman Gatlin MD 04/27/2024 08:01 PM EDT RP Workstation: [id number]   US  Venous Img Lower Unilateral Left Result Date: 04/27/2024 CLINICAL DATA:  Lower extremity edema EXAM: LEFT LOWER EXTREMITY VENOUS DOPPLER ULTRASOUND TECHNIQUE: Gray-scale sonography with graded compression, as well as color Doppler and duplex ultrasound were performed to evaluate the lower extremity deep venous systems from the level of the common femoral vein and including the common femoral, femoral, profunda femoral, popliteal and calf veins including the posterior tibial, peroneal and gastrocnemius veins when visible. The superficial great saphenous vein was also interrogated. Spectral Doppler was utilized to evaluate flow at rest and with distal augmentation maneuvers in the common femoral, femoral and popliteal veins. COMPARISON:  None Available. FINDINGS: Contralateral Common Femoral Vein: No thrombus. Normal compressibility and augmentation. Common Femoral Vein: Nonocclusive thrombus. Saphenofemoral Junction: No evidence of thrombus. Normal compressibility and flow on color Doppler imaging. Profunda Femoral Vein: No evidence of thrombus. Normal compressibility and flow on color Doppler imaging. Femoral Vein: Nonocclusive thrombus. Popliteal Vein: Nonocclusive thrombus. Calf Veins: Not visualized Superficial Great Saphenous Vein: Not visualized. Venous Reflux:  None. Other Findings:  None. IMPRESSION: Nonocclusive thrombus noted in the left common femoral, superficial femoral and popliteal veins. Electronically Signed   By: Franky Crease M.D.   On: 04/27/2024 19:59   DG Tibia/Fibula Left Result Date: 04/27/2024 CLINICAL DATA:  edema, swelling, dvt  exclude, eval for deep infection EXAM: LEFT TIBIA AND FIBULA - 2 VIEW COMPARISON:  X-ray left knee 07/28/2014 FINDINGS: No cortical erosion or destruction. Total left knee arthroplasty. There is no evidence of fracture or other focal bone lesions. Diffusely decreased bone density of the bones of the leg and ankle. Diffusely decreased bone density. IMPRESSION: 1. Diffuse subcutaneus soft tissue edema. 2.  No radiographic findings to suggest osteomyelitis. 3. No acute displaced fracture or dislocation. 4. Total left knee arthroplasty. 5. Diffusely decreased bone density. Electronically Signed   By: Morgane  Naveau M.D.   On: 04/27/2024 19:55     Assessment/Plan:  89 y.o. female with subcutaneous abscess of left lateral calf, complicated by pertinent comorbidities including:  Patient Active Problem List   Diagnosis Date Noted   Acute nonocclusive DVT of left common femoral, superficial femoral and popliteal veins  (HCC) 04/27/2024   Cellulitis and abscess of left leg 04/27/2024   Frailty 04/27/2024   Insomnia 08/14/2023   Depression 09/16/2022   Mixed Alzheimer's and vascular dementia (HCC) 05/25/2016   Osteopenia 01/09/2016   Arthritis 09/24/2015   Essential hypertension 09/24/2015   Esophagitis, reflux 09/24/2015   History of multiple strokes 09/24/2015   Hypercholesteremia 08/17/2015   Hypertension 01/14/2015    - Due to the significant tenderness, I believe will have a hard time with local  anesthesia to I&D this area.  I would like to open it for better drainage, and improve wound care and pain control for her.  - Risks discussed with the patient and her son, and they desire to proceed.  I believe questions have been adequately answered.  No guarantees were expressed or implied.  - I did indicates she will have an open wound postoperatively and will assist her with the initial wound care.  - DVT prophylaxis  All of the above findings and recommendations were discussed with the patient  and  family(if present), and all of patient's and present family's questions were answered to their expressed satisfaction.  I personally spent a total of 60 minutes in the care of the patient today including preparing to see the patient, getting/reviewing separately obtained history, performing a medically appropriate exam/evaluation, counseling and educating, placing orders, referring and communicating with other health care professionals, documenting clinical information in the EHR, and communicating results.    Thank you for the opportunity to participate in this patient's care.   -- Honor Leghorn, M.D., FACS 04/28/2024, 12:05 PM

## 2024-04-28 NOTE — Consult Note (Signed)
 [facility] Vascular Consult Note  MRN : [id number]  Susan Campos is a 89 y.o. ([birth year]) female who presents with chief complaint of  Chief Complaint  Patient presents with   Wound Check  .  History of Present Illness: 89 year old female with a history of pontine stroke in 2017 with subsequent lunar CVAs.  She is bedbound and has vascular dementia.  She also has a past medical history of hypertension and hyperlipidemia.  She presented from home by her son who noted progressive left lower extremity edema.  He states that she does have chronic edema which is usually resolved with Lasix  however he noted that the left lower extremity continued to worsen and become erythematous.  He states yesterday he noted drainage from area of the left lateral leg.  Upon evaluation in the ER it was noted that she had a 5 cm left lower leg abscess and extensive left lower extremity DVT popliteal, superficial femoral and common femoral veins.  He denies that she has had any DVTs in the past.  Current Facility-Administered Medications  Medication Dose Route Frequency Provider Last Rate Last Admin   acetaminophen  (TYLENOL ) tablet 650 mg  650 mg Oral Q6H PRN Duncan, Hazel V, MD       Or   acetaminophen  (TYLENOL ) suppository 650 mg  650 mg Rectal Q6H PRN Cleatus Delayne GAILS, MD       albuterol  (PROVENTIL ) (2.5 MG/3ML) 0.083% nebulizer solution 2.5 mg  2.5 mg Nebulization Q2H PRN Duncan, Hazel V, MD       cefTRIAXone (ROCEPHIN) 1 g in sodium chloride 0.9 % 100 mL IVPB  1 g Intravenous Q24H Duncan, Hazel V, MD       heparin ADULT infusion 100 units/mL (25000 units/250mL)  1,150 Units/hr Intravenous Continuous Dicky Anes, MD 11.5 mL/hr at 04/28/24 0747 1,150 Units/hr at 04/28/24 0747   HYDROcodone-acetaminophen  (NORCO/VICODIN) 5-325 MG per tablet 1-2 tablet  1-2 tablet Oral Q4H PRN Duncan, Hazel V, MD   1 tablet at 04/28/24 0911   morphine (PF) 2 MG/ML injection 2 mg  2 mg Intravenous  Q2H PRN Duncan, Hazel V, MD       ondansetron (ZOFRAN) tablet 4 mg  4 mg Oral Q6H PRN Duncan, Hazel V, MD       Or   ondansetron (ZOFRAN) injection 4 mg  4 mg Intravenous Q6H PRN Cleatus Delayne GAILS, MD       sertraline  (ZOLOFT ) tablet 100 mg  100 mg Oral Daily Duncan, Hazel V, MD   100 mg at 04/28/24 0912   simvastatin  (ZOCOR ) tablet 20 mg  20 mg Oral q1800 Cleatus Delayne GAILS, MD   20 mg at 04/27/24 2241   vancomycin (VANCOCIN) IVPB 1000 mg/200 mL premix  1,000 mg Intravenous Q24H Duncan, Hazel V, MD       Current Outpatient Medications  Medication Sig Dispense Refill   acetaminophen  (TYLENOL ) 650 MG CR tablet Take by mouth every 8 (eight) hours as needed.      albuterol  (VENTOLIN  HFA) 108 (90 Base) MCG/ACT inhaler INHALE 2 PUFFS INTO THE LUNGS EVERY 6 HOURS AS NEEDED FOR WHEEZING OR SHORTNESS OF BREATH 54 g 0   aspirin EC 81 MG tablet Take 81 mg by mouth daily. 9 am     calcium carbonate (TUMS - DOSED IN MG ELEMENTAL CALCIUM) 500 MG chewable tablet Chew by mouth as needed.     cholecalciferol (VITAMIN D3) 25 MCG (1000 UNIT) tablet Take 1,000 Units by mouth daily.  furosemide  (LASIX ) 20 MG tablet TAKE 1 TABLET(20 MG) BY MOUTH DAILY 90 tablet 1   Multiple Vitamin (MULTIVITAMIN) capsule Take 1 capsule by mouth daily. 9 AM     sertraline  (ZOLOFT ) 100 MG tablet Take 1 tablet (100 mg total) by mouth daily. 90 tablet 1   simvastatin  (ZOCOR ) 20 MG tablet TAKE 1 TABLET(20 MG) BY MOUTH DAILY 90 tablet 1   lisinopril  (ZESTRIL ) 10 MG tablet TAKE 1 TABLET(10 MG) BY MOUTH DAILY (Patient not taking: Reported on 04/27/2024) 90 tablet 1   nystatin  (MYCOSTATIN /NYSTOP ) powder Apply 1 Application topically 3 (three) times daily. (Patient not taking: Reported on 04/27/2024) 60 g 0   traZODone  (DESYREL ) 50 MG tablet TAKE 1/2 TO 1 TABLET(25 TO 50 MG) BY MOUTH AT BEDTIME AS NEEDED FOR SLEEP (Patient not taking: Reported on 04/27/2024) 90 tablet 1    Past Medical History:  Diagnosis Date   Arthritis    HIPS   Asthma     HX OF   Cancer (HCC)    skin   Hypertension    Orthopnea    sleeps in recliner, cannot lay flat on her back   Stroke [facility])    CVA 2012/ MEMORY LOSS, 1999, 1 more in 2011   Vertigo    OCCASIONAL    Past Surgical History:  Procedure Laterality Date   CATARACT EXTRACTION Right    CATARACT EXTRACTION W/PHACO Left 05/27/2015   Procedure: CATARACT EXTRACTION PHACO AND INTRAOCULAR LENS PLACEMENT (IOC);  Surgeon: Dene Etienne, MD;  Location: [facility];  Service: Ophthalmology;  Laterality: Left;   KNEE ARTHROSCOPY     MELANOMA EXCISION     from chest   MOUTH SURGERY     REPLACEMENT TOTAL KNEE BILATERAL     UTERINE SUSPENSION      Social History Social History   Tobacco Use   Smoking status: Former    Current packs/day: 0.50    Average packs/day: 0.5 packs/day for 4.0 years (2.0 ttl pk-yrs)    Types: Cigarettes   Smokeless tobacco: Never   Tobacco comments:    1979  Vaping Use   Vaping status: Never Used  Substance Use Topics   Alcohol use: No   Drug use: No    Family History Family History  Problem Relation Age of Onset   Heart attack Mother    Heart attack Father    Heart attack Paternal Grandmother    Skin cancer Paternal Grandmother     Allergies  Allergen Reactions   Lovastatin Shortness Of Breath and Palpitations   Aricept [Donepezil Hcl]     dizziness     REVIEW OF SYSTEMS (Negative unless checked)  Constitutional: [] Weight loss  [] Fever  [] Chills Cardiac: [] Chest pain   [] Chest pressure   [] Palpitations   [] Shortness of breath when laying flat   [] Shortness of breath at rest   [] Shortness of breath with exertion. Vascular:  [] Pain in legs with walking   [] Pain in legs at rest   [] Pain in legs when laying flat   [] Claudication   [] Pain in feet when walking  [] Pain in feet at rest  [] Pain in feet when laying flat   [] History of DVT   [] Phlebitis   [] Swelling in legs   [] Varicose veins   [] Non-healing ulcers Pulmonary:   [] Uses home  oxygen   [] Productive cough   [] Hemoptysis   [] Wheeze  [] COPD   [] Asthma Neurologic:  [] Dizziness  [] Blackouts   [] Seizures   [x] History of stroke   [] History of  TIA  [] Aphasia   [] Temporary blindness   [] Dysphagia   [] Weakness or numbness in arms   [] Weakness or numbness in legs Musculoskeletal:  [] Arthritis   [] Joint swelling   [] Joint pain   [] Low back pain Hematologic:  [] Easy bruising  [] Easy bleeding   [] Hypercoagulable state   [] Anemic  [] Hepatitis Gastrointestinal:  [] Blood in stool   [] Vomiting blood  [] Gastroesophageal reflux/heartburn   [] Difficulty swallowing. Genitourinary:  [] Chronic kidney disease   [] Difficult urination  [] Frequent urination  [] Burning with urination   [] Blood in urine Skin:  [] Rashes   [] Ulcers   [] Wounds Psychological:  [] History of anxiety   []  History of major depression.  Physical Examination  Vitals:   04/28/24 0607 04/28/24 0616 04/28/24 0746 04/28/24 1045  BP: (!) 142/56  137/60 (!) 122/59  Pulse: 78  70 73  Resp: 19  20 15   Temp:  98.5 F (36.9 C)    TempSrc:  Oral    SpO2: 98%  93% 94%  Weight:      Height:       Body mass index is 27.42 kg/m. Gen:  WD/WN, NAD Pulmonary:  Good air movement, respirations not labored, equal bilaterally.  Cardiac: RRR, normal S1, S2. Vascular: Palpable femoral pulses- pedal pulses difficult to palpate secondary to edema- warm, no evidence of ischemia Gastrointestinal: soft, non-tender/non-distended. No guarding/reflex.  Musculoskeletal: LEFT lower extremity- warm- erythema lower leg with draining abscess, 2+edema.     CBC Lab Results  Component Value Date   WBC 5.2 04/27/2024   HGB 11.5 (L) 04/27/2024   HCT 35.5 (L) 04/27/2024   MCV 90.8 04/27/2024   PLT 117 (L) 04/27/2024    BMET    Component Value Date/Time   NA 140 04/27/2024 1732   NA 145 (H) 09/08/2020 1620   NA 139 07/30/2014 0633   K 3.6 04/27/2024 1732   K 4.0 07/30/2014 0633   CL 101 04/27/2024 1732   CL 104 07/30/2014 0633   CO2  28 04/27/2024 1732   CO2 30 07/30/2014 0633   GLUCOSE 91 04/27/2024 1732   GLUCOSE 101 (H) 07/30/2014 0633   BUN 11 04/27/2024 1732   BUN 16 09/08/2020 1620   BUN 8 07/30/2014 0633   CREATININE 0.50 04/27/2024 1732   CREATININE 0.95 09/16/2022 1342   CALCIUM 8.7 (L) 04/27/2024 1732   CALCIUM 8.5 07/30/2014 0633   GFRNONAA >60 04/27/2024 1732   GFRNONAA 61 01/03/2020 1130   GFRAA 61 09/08/2020 1620   GFRAA 71 01/03/2020 1130   Estimated Creatinine Clearance: 45.7 mL/min (by C-G formula based on SCr of 0.5 mg/dL).  COAG Lab Results  Component Value Date   INR 1.0 07/16/2014   INR 0.9 01/30/2012   INR 0.9 10/19/2011    Radiology US  LT LOWER EXTREM LTD SOFT TISSUE NON VASCULAR Result Date: 04/27/2024 EXAM: US  left Lower Extremity Nonvascular Soft Tissue Ultrasound TECHNIQUE: Real-time ultrasound scan of the left lower extremity with image documentation. COMPARISON: None provided. CLINICAL HISTORY: 407327 Wound check, abscess [id number]. Wound check, abscess [id number]. FINDINGS: SOFT TISSUES: Targeted ultrasound of the left lower extremity in the area of clinical concern on the anterior mid lower leg demonstrates an irregular hypoechoic fluid collection measuring approximately 4.5 x 1.6 x 4.9 cm. Adjacent hyperemia. Findings are compatible with abscess. No solid mass. IMPRESSION: 1. 4.9 cm abscess  in the left anterior mid lower leg, compatible with abscess. Electronically signed by: Norman Gatlin MD 04/27/2024 08:01 PM EDT RP Workstation: [id number]   US  Venous Img  Lower Unilateral Left Result Date: 04/27/2024 CLINICAL DATA:  Lower extremity edema EXAM: LEFT LOWER EXTREMITY VENOUS DOPPLER ULTRASOUND TECHNIQUE: Gray-scale sonography with graded compression, as well as color Doppler and duplex ultrasound were performed to evaluate the lower extremity deep venous systems from the level of the common femoral vein and including the common femoral, femoral, profunda femoral, popliteal and calf veins  including the posterior tibial, peroneal and gastrocnemius veins when visible. The superficial great saphenous vein was also interrogated. Spectral Doppler was utilized to evaluate flow at rest and with distal augmentation maneuvers in the common femoral, femoral and popliteal veins. COMPARISON:  None Available. FINDINGS: Contralateral Common Femoral Vein: No thrombus. Normal compressibility and augmentation. Common Femoral Vein: Nonocclusive thrombus. Saphenofemoral Junction: No evidence of thrombus. Normal compressibility and flow on color Doppler imaging. Profunda Femoral Vein: No evidence of thrombus. Normal compressibility and flow on color Doppler imaging. Femoral Vein: Nonocclusive thrombus. Popliteal Vein: Nonocclusive thrombus. Calf Veins: Not visualized Superficial Great Saphenous Vein: Not visualized. Venous Reflux:  None. Other Findings:  None. IMPRESSION: Nonocclusive thrombus noted in the left common femoral, superficial femoral and popliteal veins. Electronically Signed   By: Franky Crease M.D.   On: 04/27/2024 19:59   DG Tibia/Fibula Left Result Date: 04/27/2024 CLINICAL DATA:  edema, swelling, dvt exclude, eval for deep infection EXAM: LEFT TIBIA AND FIBULA - 2 VIEW COMPARISON:  X-ray left knee 07/28/2014 FINDINGS: No cortical erosion or destruction. Total left knee arthroplasty. There is no evidence of fracture or other focal bone lesions. Diffusely decreased bone density of the bones of the leg and ankle. Diffusely decreased bone density. IMPRESSION: 1. Diffuse subcutaneus soft tissue edema. 2.  No radiographic findings to suggest osteomyelitis. 3. No acute displaced fracture or dislocation. 4. Total left knee arthroplasty. 5. Diffusely decreased bone density. Electronically Signed   By: Morgane  Naveau M.D.   On: 04/27/2024 19:55      Assessment/Plan 1. Extensive LEFT- CFV, SFV and popliteal vein  DVT 2. LEFT leg Abscess- 5cm  Recommend Heparin gtt- Will monitor- possible thrombectomy  within the next few days. Abscess treatment per General Surgery ABX  Discussed findings and plan with son at bedside. All questions answered. Understanding expressed.    Tisa Curry LABOR, MD  04/28/2024 11:32 AM    This note was created with Dragon medical transcription system.  Any error is purely unintentional

## 2024-04-28 NOTE — Progress Notes (Signed)
 [facility]  - [place] [facility]   PATIENT NAME: Susan Campos    MR#:  [id number]  DATE OF BIRTH:  [birth year]  SUBJECTIVE:  patient son at bedside. Patient gives me most of the history. Patient is bedbound for 4:05 months. Noticed swelling redness and inflammation of left lower extremity patient has vascular dementia unable to give much history review system. Said a few words. No fever. NPO for possible incision and drainage. IV heparin drip    VITALS:  Blood pressure 132/63, pulse 67, temperature 98.5 F (36.9 C), temperature source Oral, resp. rate 15, height 5' 3 (1.6 m), weight 70.2 kg, SpO2 94%.  PHYSICAL EXAMINATION:  limited due to GENERAL:  89 y.o.-year-old patient with no acute distress.  LUNGS: Normal breath sounds bilaterally CARDIOVASCULAR: S1, S2 normal. No murmur   ABDOMEN: Soft, nontender, nondistended. Bowel sounds present.  EXTREMITIES: bilateral edema b/l.    Left lower extremity NEUROLOGIC: nonfocal  patient is alert and awake   LABORATORY PANEL:  CBC Recent Labs  Lab 04/27/24 1732  WBC 5.2  HGB 11.5*  HCT 35.5*  PLT 117*    Chemistries  Recent Labs  Lab 04/27/24 1732  NA 140  K 3.6  CL 101  CO2 28  GLUCOSE 91  BUN 11  CREATININE 0.50  CALCIUM 8.7*  AST 24  ALT 13  ALKPHOS 102  BILITOT 1.0   Cardiac Enzymes No results for input(s): TROPONINI in the last 168 hours. RADIOLOGY:  US  LT LOWER EXTREM LTD SOFT TISSUE NON VASCULAR Result Date: 04/27/2024 EXAM: US  left Lower Extremity Nonvascular Soft Tissue Ultrasound TECHNIQUE: Real-time ultrasound scan of the left lower extremity with image documentation. COMPARISON: None provided. CLINICAL HISTORY: 407327 Wound check, abscess [id number]. Wound check, abscess [id number]. FINDINGS: SOFT TISSUES: Targeted ultrasound of the left lower extremity in the area of clinical concern on the anterior mid lower leg demonstrates an irregular hypoechoic fluid collection measuring  approximately 4.5 x 1.6 x 4.9 cm. Adjacent hyperemia. Findings are compatible with abscess. No solid mass. IMPRESSION: 1. 4.9 cm abscess  in the left anterior mid lower leg, compatible with abscess. Electronically signed by: Norman Gatlin MD 04/27/2024 08:01 PM EDT RP Workstation: [id number]   US  Venous Img Lower Unilateral Left Result Date: 04/27/2024 CLINICAL DATA:  Lower extremity edema EXAM: LEFT LOWER EXTREMITY VENOUS DOPPLER ULTRASOUND TECHNIQUE: Gray-scale sonography with graded compression, as well as color Doppler and duplex ultrasound were performed to evaluate the lower extremity deep venous systems from the level of the common femoral vein and including the common femoral, femoral, profunda femoral, popliteal and calf veins including the posterior tibial, peroneal and gastrocnemius veins when visible. The superficial great saphenous vein was also interrogated. Spectral Doppler was utilized to evaluate flow at rest and with distal augmentation maneuvers in the common femoral, femoral and popliteal veins. COMPARISON:  None Available. FINDINGS: Contralateral Common Femoral Vein: No thrombus. Normal compressibility and augmentation. Common Femoral Vein: Nonocclusive thrombus. Saphenofemoral Junction: No evidence of thrombus. Normal compressibility and flow on color Doppler imaging. Profunda Femoral Vein: No evidence of thrombus. Normal compressibility and flow on color Doppler imaging. Femoral Vein: Nonocclusive thrombus. Popliteal Vein: Nonocclusive thrombus. Calf Veins: Not visualized Superficial Great Saphenous Vein: Not visualized. Venous Reflux:  None. Other Findings:  None. IMPRESSION: Nonocclusive thrombus noted in the left common femoral, superficial femoral and popliteal veins. Electronically Signed   By: Franky Crease M.D.   On: 04/27/2024 19:59   DG Tibia/Fibula Left Result Date: 04/27/2024 CLINICAL DATA:  edema, swelling, dvt exclude, eval for deep infection EXAM: LEFT TIBIA AND FIBULA - 2  VIEW COMPARISON:  X-ray left knee 07/28/2014 FINDINGS: No cortical erosion or destruction. Total left knee arthroplasty. There is no evidence of fracture or other focal bone lesions. Diffusely decreased bone density of the bones of the leg and ankle. Diffusely decreased bone density. IMPRESSION: 1. Diffuse subcutaneus soft tissue edema. 2.  No radiographic findings to suggest osteomyelitis. 3. No acute displaced fracture or dislocation. 4. Total left knee arthroplasty. 5. Diffusely decreased bone density. Electronically Signed   By: Morgane  Naveau M.D.   On: 04/27/2024 19:55   Assessment and Plan  From H and P: Susan Campos is a 89 y.o. female with medical history significant for HTN, HLD, OA, prior pontine stroke 2017 and multiple lacunar strokes, with residual ambulatory dysfunction, bedbound for the past 4 months, as well as history of vascular dementia, cared for at home by her son who is being admitted with cellulitis and abscess left anterior lower leg as well as an extensive nonocclusive DVT of the same leg.  Son noticed an area of redness and swelling a few days prior which has progressed and then started draining purulent material on the day of arrival thus prompting the visit to the ED.   X-ray tib-fib showed diffuse subcutaneous soft tissue edema  Nonvascular ultrasound left lower extremity a 4.9 cm abscess centimeter abscess left anterior mid lower leg  Venous ultrasound showing a nonocclusive thrombus left common femoral, superficial femoral and popliteal veins  Cellulitis and abscess of left leg --Abscess spontaneously draining --Continue Rocephin and vancomycin --WC--rare WBC  no growth --BC negative --Surgery consulted--Dr Rodenburg to see pt. Likely getting I and D --N.p.o. from midnight per surgical request --Pain control --wbc normal, no fever   Acute nonocclusive DVT of left common femoral, superficial femoral and popliteal veins  (HCC) --Likely provoked as  patient is currently nonambulant/bedbound --Continue heparin infusion --DVT is nonocclusive:  vascular consult for opinion on need for procedure--appreciate Dr Kathrine input   History of multiple strokes --Holding aspirin --Does not appear to be on statins-listed lovastatin allergy   Mixed Alzheimer's and vascular dementia (HCC) Frailty/advanced age/nonambulant x 4 months, previously ambulated with walker Depression --Continue sertraline , trazodone  --Spoke with son Cyndee: Patient has 24-hour caregiver.  Does not want hospice. Open to home health(says he already has all the equipment he needs in the home) [facility] consult for d/c needs   Hypertension --Currently takes furosemide --will hold --We will consider resuming lisinopril  if bp remains overall high which was previously discontinued-no listed allergy   DVT prophylaxis: heparin gtt   Consults: Surgery, vascular   Advance Care Planning:   Code Status: Not on file   Family Communication: Son at bedside Level of care: Progressive Status is: Observation     TOTAL TIME TAKING CARE OF THIS PATIENT: 40 minutes.  >50% time spent on counselling and coordination of care  Note: This dictation was prepared with Dragon dictation along with smaller phrase technology. Any transcriptional errors that result from this process are unintentional.  Susan Campos M.D    [facility]   CC: Primary care physician; Antonette Angeline ORN, NP

## 2024-04-28 NOTE — Progress Notes (Signed)
 PHARMACY - ANTICOAGULATION CONSULT NOTE  Pharmacy Consult for Heparin  Indication: DVT  Allergies  Allergen Reactions   Lovastatin Shortness Of Breath and Palpitations   Aricept [Donepezil Hcl]     dizziness    Patient Measurements: Height: 5' 3 (160 cm) Weight: 70.2 kg (154 lb 12.2 oz) IBW/kg (Calculated) : 52.4 HEPARIN DW (KG): 66.9  Vital Signs: Temp: 98.2 F (36.8 C) (09/28 1300) Temp Source: Oral (09/28 1300) BP: 147/64 (09/28 1300) Pulse Rate: 72 (09/28 1300)  Labs: Recent Labs    04/27/24 1732 04/28/24 0601 04/28/24 1352  HGB 11.5*  --   --   HCT 35.5*  --   --   PLT 117*  --   --   HEPARINUNFRC  --  0.49 0.59  CREATININE 0.50  --  0.51    Estimated Creatinine Clearance: 45.7 mL/min (by C-G formula based on SCr of 0.51 mg/dL).   Medical History: Past Medical History:  Diagnosis Date   Arthritis    HIPS   Asthma    HX OF   Cancer (HCC)    skin   Hypertension    Orthopnea    sleeps in recliner, cannot lay flat on her back   Stroke [facility])    CVA 2012/ MEMORY LOSS, 1999, 1 more in 2011   Vertigo    OCCASIONAL    Medications:  Medications Prior to Admission  Medication Sig Dispense Refill Last Dose/Taking   acetaminophen  (TYLENOL ) 650 MG CR tablet Take by mouth every 8 (eight) hours as needed.    Unknown   albuterol  (VENTOLIN  HFA) 108 (90 Base) MCG/ACT inhaler INHALE 2 PUFFS INTO THE LUNGS EVERY 6 HOURS AS NEEDED FOR WHEEZING OR SHORTNESS OF BREATH 54 g 0 Unknown   aspirin EC 81 MG tablet Take 81 mg by mouth daily. 9 am   04/26/2024   calcium carbonate (TUMS - DOSED IN MG ELEMENTAL CALCIUM) 500 MG chewable tablet Chew by mouth as needed.   Unknown   cholecalciferol (VITAMIN D3) 25 MCG (1000 UNIT) tablet Take 1,000 Units by mouth daily.   04/26/2024   furosemide  (LASIX ) 20 MG tablet TAKE 1 TABLET(20 MG) BY MOUTH DAILY 90 tablet 1 04/26/2024   Multiple Vitamin (MULTIVITAMIN) capsule Take 1 capsule by mouth daily. 9 AM   Unknown   sertraline  (ZOLOFT )  100 MG tablet Take 1 tablet (100 mg total) by mouth daily. 90 tablet 1 04/26/2024   simvastatin  (ZOCOR ) 20 MG tablet TAKE 1 TABLET(20 MG) BY MOUTH DAILY 90 tablet 1 04/26/2024   lisinopril  (ZESTRIL ) 10 MG tablet TAKE 1 TABLET(10 MG) BY MOUTH DAILY (Patient not taking: Reported on 04/27/2024) 90 tablet 1 Not Taking    Assessment: Pharmacy consulted to dose heparin in this 89 year old female admitted with DVT.  No prior anticoag noted. CrCl = 45.7 ml/min  9/28:  HL @ 0601 = 0.49, therapeutic X 1 0928 1352 HL 0.51, therapeutic x2  Goal of Therapy:  Heparin level 0.3-0.7 units/ml Monitor platelets by anticoagulation protocol: Yes   Plan:  0928 1352 HL 0.51, therapeutic x2 - Will continue pt on current rate of 1150 unit/hr and recheck HL in am - CBC daily  Allean Haas PharmD Clinical Pharmacist 04/28/2024

## 2024-04-28 NOTE — Op Note (Addendum)
 Complex I&D left lateral calf abscessed/infected hematoma.  Pre-operative Diagnosis: Abscess with cellulitis left lateral calf  Post-operative Diagnosis: same, with significant hematoma present.  Surgeon: Honor Leghorn, M.D., FACS  Anesthesia: MAC with local anesthesia.  Findings: Infected hematoma draining, no significant purulent drainage, significant clot present within the space measured by ultrasound preoperatively.  Estimated Blood Loss: 10 mL         Specimens: None, preoperative cultures obtained from surface, not anticipating this hematoma to provide significant additional information.          Complications: none              Procedure Details  The patient was seen again in the Holding Room. The benefits, complications, treatment options, and expected outcomes were discussed with the patient. The risks of bleeding, infection, recurrence of symptoms, failure to resolve symptoms, unanticipated injury, prosthetic placement, prosthetic infection, any of which could require further surgery were reviewed with the patient. The likelihood of improving the patient's symptoms with return to their baseline status is expected.  The patient and/or family concurred with the proposed plan, giving informed consent.  The patient was taken to Operating Room, identified and the procedure verified.    Prior to the induction of general anesthesia, antibiotic prophylaxis was administered. VTE prophylaxis was in place.  MAC anesthesia was then administered and tolerated well. After the induction, the patient was positioned in the supine position and the left lower leg was prepped with Betadine  and draped in the sterile fashion.  A Time Out was held and the above information confirmed.  Local infiltration with quarter percent Marcaine with epinephrine  was utilized adequate anesthetic effect.  I probed the opening through which I saw drainage previously, and explored the wound for a vertical incision  along the axis of the extremity.  4 to 5 cm incision was made, extended into the subcutaneous tissues, remaining external to the fascia.  The cavity was explored with expulsion of thrombotic material.  The wound was then irrigated with multiple aliquots of sterile normal saline solution.  I then packed the incision with half-inch wide iodoform packing strip, covered it with multiple 4 x 4's, wrapped it with Kerlix.  I then wrapped it with an Ace wrap beginning at the midfoot and providing some additional support to the area, considering she will continue to be on heparin, fully anticoagulated for DVT. She tolerated procedure well.      Honor Leghorn M.D., [facility] 04/28/2024 4:37 PM

## 2024-04-28 NOTE — Progress Notes (Signed)
 PHARMACY - ANTICOAGULATION CONSULT NOTE  Pharmacy Consult for Heparin  Indication: DVT  Allergies  Allergen Reactions   Lovastatin Shortness Of Breath and Palpitations   Aricept [Donepezil Hcl]     dizziness    Patient Measurements: Height: 5' 3 (160 cm) Weight: 70.2 kg (154 lb 12.2 oz) IBW/kg (Calculated) : 52.4 HEPARIN DW (KG): 66.9  Vital Signs: Temp: 98.5 F (36.9 C) (09/28 0616) Temp Source: Oral (09/28 0616) BP: 142/56 (09/28 0607) Pulse Rate: 78 (09/28 0607)  Labs: Recent Labs    04/27/24 1732 04/28/24 0601  HGB 11.5*  --   HCT 35.5*  --   PLT 117*  --   HEPARINUNFRC  --  0.49  CREATININE 0.50  --     Estimated Creatinine Clearance: 45.7 mL/min (by C-G formula based on SCr of 0.5 mg/dL).   Medical History: Past Medical History:  Diagnosis Date   Arthritis    HIPS   Asthma    HX OF   Cancer (HCC)    skin   Hypertension    Orthopnea    sleeps in recliner, cannot lay flat on her back   Stroke [facility])    CVA 2012/ MEMORY LOSS, 1999, 1 more in 2011   Vertigo    OCCASIONAL    Medications:  (Not in a hospital admission)   Assessment: Pharmacy consulted to dose heparin in this 89 year old female admitted with DVT.  No prior anticoag noted. CrCl = 45.7 ml/min  Goal of Therapy:  Heparin level 0.3-0.7 units/ml Monitor platelets by anticoagulation protocol: Yes   Plan:  9/28:  HL @ 0601 = 0.49, therapeutic X 1 - Will continue pt on current rate and recheck HL in 8 hrs - CBC daily  Graycen Degan D 04/28/2024,7:09 AM

## 2024-04-28 NOTE — Anesthesia Procedure Notes (Signed)
 Procedure Name: MAC Date/Time: 04/28/2024 3:50 PM  Performed by: Delores Evalene BROCKS, CRNAPre-anesthesia Checklist: Patient identified, Emergency Drugs available, Suction available and Patient being monitored Patient Re-evaluated:Patient Re-evaluated prior to induction Oxygen Delivery Method: Simple face mask Induction Type: IV induction Placement Confirmation: positive ETCO2

## 2024-04-28 NOTE — Transfer of Care (Signed)
 Immediate Anesthesia Transfer of Care Note  Patient: Susan Campos  Procedure(s) Performed: INCISION AND DRAINAGE, ABSCESS (Left: Leg Lower)  Patient Location: PACU  Anesthesia Type:General  Level of Consciousness: drowsy  Airway & Oxygen Therapy: Patient Spontanous Breathing and Patient connected to face mask oxygen  Post-op Assessment: Report given to RN and Post -op Vital signs reviewed and stable  Post vital signs: Reviewed and stable  Last Vitals:  Vitals Value Taken Time  BP 104/58 04/28/24 16:30  Temp    Pulse 65 04/28/24 16:35  Resp 15 04/28/24 16:35  SpO2 100 % 04/28/24 16:35  Vitals shown include unfiled device data.  Last Pain:  Vitals:   04/28/24 1300  TempSrc: Oral  PainSc:          Complications: No notable events documented.

## 2024-04-28 NOTE — Anesthesia Postprocedure Evaluation (Signed)
 Anesthesia Post Note  Patient: Susan Campos  Procedure(s) Performed: INCISION AND DRAINAGE, ABSCESS (Left: Leg Lower)  Patient location during evaluation: PACU Anesthesia Type: General Level of consciousness: awake, confused and patient cooperative Pain management: pain level controlled Vital Signs Assessment: post-procedure vital signs reviewed and stable Respiratory status: spontaneous breathing, nonlabored ventilation and respiratory function stable Cardiovascular status: blood pressure returned to baseline and stable Postop Assessment: no apparent nausea or vomiting Anesthetic complications: no   No notable events documented.   Last Vitals:  Vitals:   04/28/24 1700 04/28/24 1715  BP: 134/62 139/64  Pulse: 63 60  Resp: 15 14  Temp:  (!) 36.3 C  SpO2: 94% 93%    Last Pain:  Vitals:   04/28/24 1715  TempSrc:   PainSc: 0-No pain                 Camellia Merilee Louder

## 2024-04-29 ENCOUNTER — Encounter: Payer: Self-pay | Admitting: Surgery

## 2024-04-29 ENCOUNTER — Telehealth (HOSPITAL_COMMUNITY): Payer: Self-pay | Admitting: Pharmacy Technician

## 2024-04-29 ENCOUNTER — Other Ambulatory Visit (HOSPITAL_COMMUNITY): Payer: Self-pay

## 2024-04-29 DIAGNOSIS — L03116 Cellulitis of left lower limb: Secondary | ICD-10-CM | POA: Diagnosis not present

## 2024-04-29 DIAGNOSIS — Z8673 Personal history of transient ischemic attack (TIA), and cerebral infarction without residual deficits: Secondary | ICD-10-CM

## 2024-04-29 DIAGNOSIS — I82412 Acute embolism and thrombosis of left femoral vein: Secondary | ICD-10-CM | POA: Diagnosis not present

## 2024-04-29 DIAGNOSIS — F0280 Dementia in other diseases classified elsewhere without behavioral disturbance: Secondary | ICD-10-CM

## 2024-04-29 DIAGNOSIS — G309 Alzheimer's disease, unspecified: Secondary | ICD-10-CM

## 2024-04-29 DIAGNOSIS — Z7189 Other specified counseling: Secondary | ICD-10-CM | POA: Diagnosis not present

## 2024-04-29 DIAGNOSIS — F0150 Vascular dementia without behavioral disturbance: Secondary | ICD-10-CM

## 2024-04-29 DIAGNOSIS — I82432 Acute embolism and thrombosis of left popliteal vein: Secondary | ICD-10-CM

## 2024-04-29 LAB — URINALYSIS, W/ REFLEX TO CULTURE (INFECTION SUSPECTED)
Bilirubin Urine: NEGATIVE
Glucose, UA: NEGATIVE mg/dL
Hgb urine dipstick: NEGATIVE
Ketones, ur: NEGATIVE mg/dL
Leukocytes,Ua: NEGATIVE
Nitrite: NEGATIVE
Protein, ur: NEGATIVE mg/dL
Specific Gravity, Urine: 1.01 (ref 1.005–1.030)
pH: 6 (ref 5.0–8.0)

## 2024-04-29 LAB — HEPARIN LEVEL (UNFRACTIONATED): Heparin Unfractionated: 0.54 [IU]/mL (ref 0.30–0.70)

## 2024-04-29 LAB — CREATININE, SERUM
Creatinine, Ser: 0.47 mg/dL (ref 0.44–1.00)
GFR, Estimated: >60 mL/min (ref >60)

## 2024-04-29 LAB — CBC
HCT: 31.4 % — ABNORMAL LOW (ref 36.0–46.0)
Hemoglobin: 10.6 g/dL — ABNORMAL LOW (ref 12.0–15.0)
MCH: 29.5 pg (ref 26.0–34.0)
MCHC: 33.8 g/dL (ref 30.0–36.0)
MCV: 87.5 fL (ref 80.0–100.0)
Platelets: 113 K/uL — ABNORMAL LOW (ref 150–400)
RBC: 3.59 MIL/uL — ABNORMAL LOW (ref 3.87–5.11)
RDW: 13 % (ref 11.5–15.5)
WBC: 4.9 K/uL (ref 4.0–10.5)
nRBC: 0 % (ref 0.0–0.2)

## 2024-04-29 MED ORDER — APIXABAN 2.5 MG PO TABS
2.5000 mg | ORAL_TABLET | Freq: Two times a day (BID) | ORAL | Status: DC
  Administered 2024-04-29 – 2024-04-30 (×2): 2.5 mg via ORAL
  Filled 2024-04-29 (×2): qty 1

## 2024-04-29 MED ORDER — APIXABAN 2.5 MG PO TABS: 2.5000 mg | ORAL_TABLET | Freq: Two times a day (BID) | ORAL | Status: DC

## 2024-04-29 NOTE — Progress Notes (Signed)
 [facility]  - [place] [facility]   PATIENT NAME: Susan Campos    MR#:  [id number]  DATE OF BIRTH:  08-13-1932  SUBJECTIVE:  patient son at bedside.  No new issues.  Son requesting to keep her comfortable and at home as long as possible VITALS:  Blood pressure (!) 123/49, pulse (!) 59, temperature 98.6 F (37 C), resp. rate 16, height 5' 3 (1.6 m), weight 70.2 kg, SpO2 94%.  PHYSICAL EXAMINATION:  limited due to GENERAL:  89 y.o.-year-old patient with no acute distress.  LUNGS: Normal breath sounds bilaterally CARDIOVASCULAR: S1, S2 normal. No murmur   ABDOMEN: Soft, nontender, nondistended. Bowel sounds present.  EXTREMITIES: bilateral edema b/l.    Left lower extremity NEUROLOGIC: nonfocal  patient is alert and awake   LABORATORY PANEL:  CBC Recent Labs  Lab 04/29/24 0746  WBC 4.9  HGB 10.6*  HCT 31.4*  PLT 113*    Chemistries  Recent Labs  Lab 04/27/24 1732 04/28/24 1352 04/29/24 0746  NA 140  --   --   K 3.6  --   --   CL 101  --   --   CO2 28  --   --   GLUCOSE 91  --   --   BUN 11  --   --   CREATININE 0.50   < > 0.47  CALCIUM 8.7*  --   --   AST 24  --   --   ALT 13  --   --   ALKPHOS 102  --   --   BILITOT 1.0  --   --    < > = values in this interval not displayed.   Cardiac Enzymes No results for input(s): TROPONINI in the last 168 hours. RADIOLOGY:  US  LT LOWER EXTREM LTD SOFT TISSUE NON VASCULAR Result Date: 04/27/2024 EXAM: US  left Lower Extremity Nonvascular Soft Tissue Ultrasound TECHNIQUE: Real-time ultrasound scan of the left lower extremity with image documentation. COMPARISON: None provided. CLINICAL HISTORY: 407327 Wound check, abscess [id number]. Wound check, abscess [id number]. FINDINGS: SOFT TISSUES: Targeted ultrasound of the left lower extremity in the area of clinical concern on the anterior mid lower leg demonstrates an irregular hypoechoic fluid collection measuring approximately 4.5 x 1.6 x 4.9 cm. Adjacent  hyperemia. Findings are compatible with abscess. No solid mass. IMPRESSION: 1. 4.9 cm abscess  in the left anterior mid lower leg, compatible with abscess. Electronically signed by: Norman Gatlin MD 04/27/2024 08:01 PM EDT RP Workstation: [id number]   US  Venous Img Lower Unilateral Left Result Date: 04/27/2024 CLINICAL DATA:  Lower extremity edema EXAM: LEFT LOWER EXTREMITY VENOUS DOPPLER ULTRASOUND TECHNIQUE: Gray-scale sonography with graded compression, as well as color Doppler and duplex ultrasound were performed to evaluate the lower extremity deep venous systems from the level of the common femoral vein and including the common femoral, femoral, profunda femoral, popliteal and calf veins including the posterior tibial, peroneal and gastrocnemius veins when visible. The superficial great saphenous vein was also interrogated. Spectral Doppler was utilized to evaluate flow at rest and with distal augmentation maneuvers in the common femoral, femoral and popliteal veins. COMPARISON:  None Available. FINDINGS: Contralateral Common Femoral Vein: No thrombus. Normal compressibility and augmentation. Common Femoral Vein: Nonocclusive thrombus. Saphenofemoral Junction: No evidence of thrombus. Normal compressibility and flow on color Doppler imaging. Profunda Femoral Vein: No evidence of thrombus. Normal compressibility and flow on color Doppler imaging. Femoral Vein: Nonocclusive thrombus. Popliteal Vein: Nonocclusive thrombus. Calf Veins: Not visualized  Superficial Great Saphenous Vein: Not visualized. Venous Reflux:  None. Other Findings:  None. IMPRESSION: Nonocclusive thrombus noted in the left common femoral, superficial femoral and popliteal veins. Electronically Signed   By: Franky Crease M.D.   On: 04/27/2024 19:59   DG Tibia/Fibula Left Result Date: 04/27/2024 CLINICAL DATA:  edema, swelling, dvt exclude, eval for deep infection EXAM: LEFT TIBIA AND FIBULA - 2 VIEW COMPARISON:  X-ray left knee  07/28/2014 FINDINGS: No cortical erosion or destruction. Total left knee arthroplasty. There is no evidence of fracture or other focal bone lesions. Diffusely decreased bone density of the bones of the leg and ankle. Diffusely decreased bone density. IMPRESSION: 1. Diffuse subcutaneus soft tissue edema. 2.  No radiographic findings to suggest osteomyelitis. 3. No acute displaced fracture or dislocation. 4. Total left knee arthroplasty. 5. Diffusely decreased bone density. Electronically Signed   By: Morgane  Naveau M.D.   On: 04/27/2024 19:55   Assessment and Plan  From H and P: Jenina Moening is a 89 y.o. female with medical history significant for HTN, HLD, OA, prior pontine stroke 2017 and multiple lacunar strokes, with residual ambulatory dysfunction, bedbound for the past 4 months, as well as history of vascular dementia, cared for at home by her son who is being admitted with cellulitis and abscess left anterior lower leg as well as an extensive nonocclusive DVT of the same leg.  Son noticed an area of redness and swelling a few days prior which has progressed and then started draining purulent material on the day of arrival thus prompting the visit to the ED.   X-ray tib-fib showed diffuse subcutaneous soft tissue edema  Nonvascular ultrasound left lower extremity a 4.9 cm abscess centimeter abscess left anterior mid lower leg  Venous ultrasound showing a nonocclusive thrombus left common femoral, superficial femoral and popliteal veins  Cellulitis and abscess of left leg/calf abscess --Abscess spontaneously draining --Continue Rocephin and vancomycin --BC negative - Status post I&D of the left lateral calf abscess - Postop day 1.  Dressing changes per surgery.  s/p incision and drainage of left lateral calf abscess - Pack left lateral calf wound with iodoform packing strips, cover with dry gauze, kerlix, secure. Wrap with ACE. This can be changed as needed. Okay to remove to  shower. This should be able to be preformed by bedside RN at this point as well.  -Outpatient follow-up with surgery in 2 weeks   Acute nonocclusive DVT of left common femoral, superficial femoral and popliteal veins  (HCC) --Likely provoked as patient is currently nonambulant/bedbound -- Discussed with vascular surgery.  Patient will be unable to tolerate thrombectomy or any major surgery.  If -They recommend transitioning her to oral anticoagulant/DOAC.  They recommend Eliquis 2.5 mg p.o. twice daily.  I will stop heparin and transition her to Eliquis   History of multiple strokes --Holding aspirin for now can resume at discharge --Does not appear to be on statins-listed lovastatin allergy   Mixed Alzheimer's and vascular dementia (HCC) Frailty/advanced age/nonambulant x 4 months, previously ambulated with walker Depression --Continue sertraline , trazodone  --Spoke with son Cyndee: Patient has 24-hour caregiver.   - Son is agreeable for hospice at home   Hypertension -- Control   DVT prophylaxis: Eliquis   Consults: Surgery, vascular   Advance Care Planning: DNR   Family Communication: Son at bedside.  Son/Chase/POA updated over phone Level of care: Med-Surg Status is: Observation     TOTAL TIME TAKING CARE OF THIS PATIENT: 40 minutes.  >  50% time spent on counselling and coordination of care  Note: This dictation was prepared with Dragon dictation along with smaller phrase technology. Any transcriptional errors that result from this process are unintentional.  Cresencio Fairly M.D    [facility]   CC: Primary care physician; Antonette Angeline ORN, NP

## 2024-04-29 NOTE — TOC Progression Note (Signed)
 Transition of Care [facility]) - Progression Note    Patient Details  Name: Susan Campos MRN: [id number] Date of Birth: [birth year]  Transition of Care [facility]) CM/SW Contact  Marinda Cooks, RN Phone Number: 04/29/2024, 2:41 PM  Clinical Narrative:    This CM updated by MD pt's son Cyndee [facility]) desire to move forward with [facility] hospice services at dc . This CM spoke with pt's son introduced role and provided choice. Pt's son agreed to have services coordinated with [facility] / This CM updated medical team . TOC will cont to follow dc planning / care coordination and update as applicable.   Expected Discharge Plan and Services  Home with [facility] services  Social Drivers of Health (SDOH) Interventions SDOH Screenings   Food Insecurity: No Food Insecurity (04/29/2024)  Housing: Low Risk  (04/29/2024)  Transportation Needs: Unknown (04/29/2024)  Utilities: Not At Risk (04/29/2024)  Alcohol Screen: Low Risk  (06/16/2023)  Depression (PHQ2-9): Medium Risk (08/14/2023)  Financial Resource Strain: Low Risk  (06/16/2023)  Physical Activity: Inactive (06/16/2023)  Social Connections: Unknown (04/29/2024)  Stress: No Stress Concern Present (06/16/2023)  Tobacco Use: Medium Risk (04/27/2024)  Health Literacy: Adequate Health Literacy (06/16/2023)    Readmission Risk Interventions     No data to display

## 2024-04-29 NOTE — IPAL (Signed)
  Interdisciplinary Goals of Care Family Meeting   Date carried out: 04/29/2024  Location of the meeting: Phone conference  Member's involved: Physician and Family Member or next of kin  Durable Power of Attorney or Environmental health practitioner: Son/Chase  Discussion: We discussed goals of care for [employer]   Code status:   Code Status: Limited: Do not attempt resuscitation (DNR) -DNR-LIMITED -Do Not Intubate/DNI    Disposition: Home with Hospice  Time spent for the meeting: 35 minutes    Cresencio Fairly, MD  04/29/2024, 4:54 PM

## 2024-04-29 NOTE — Plan of Care (Signed)
  Problem: Clinical Measurements: Goal: Ability to avoid or minimize complications of infection will improve Outcome: Progressing   Problem: Skin Integrity: Goal: Skin integrity will improve Outcome: Progressing   Problem: Education: Goal: Knowledge of General Education information will improve Description: Including pain rating scale, medication(s)/side effects and non-pharmacologic comfort measures Outcome: Progressing   Problem: Health Behavior/Discharge Planning: Goal: Ability to manage health-related needs will improve Outcome: Progressing   Problem: Clinical Measurements: Goal: Ability to maintain clinical measurements within normal limits will improve Outcome: Progressing Goal: Will remain free from infection Outcome: Progressing Goal: Diagnostic test results will improve Outcome: Progressing Goal: Respiratory complications will improve Outcome: Progressing Goal: Cardiovascular complication will be avoided Outcome: Progressing   Problem: Activity: Goal: Risk for activity intolerance will decrease Outcome: Progressing   Problem: Nutrition: Goal: Adequate nutrition will be maintained Outcome: Progressing   Problem: Coping: Goal: Level of anxiety will decrease Outcome: Progressing   Problem: Elimination: Goal: Will not experience complications related to bowel motility Outcome: Progressing Goal: Will not experience complications related to urinary retention Outcome: Progressing   Problem: Pain Managment: Goal: General experience of comfort will improve and/or be controlled Outcome: Progressing   Problem: Safety: Goal: Ability to remain free from injury will improve Outcome: Progressing   Problem: Skin Integrity: Goal: Risk for impaired skin integrity will decrease Outcome: Progressing

## 2024-04-29 NOTE — Progress Notes (Signed)
 Progress Note    04/29/2024 1:08 PM 1 Day Post-Op  Subjective:  Susan Campos is an 89 yo female who presents to [facility] emergency room with left lower extremity edema. She did have chronic edema which is normally resolved with lasix  however the lower extremity edema worsened with lasix  and she developed erythema. She was also noted to have a 5 cm left lower extremity. She completed lower extremity venous ultrasounds which revealed extensive left lower extremity DVT popliteal, superficial femoral and common femoral veins.    Vitals:   04/29/24 0902 04/29/24 1154  BP: (!) 151/53 (!) 119/49  Pulse: 64 69  Resp: 17 18  Temp: (!) 97.5 F (36.4 C) 98.2 F (36.8 C)  SpO2: 96% 95%   Physical Exam: Cardiac:  RRR, Normal S1,S2. No murmurs. No Lungs:  Non Labored breathing. Lungs clear throughout but diminished in the bases.  Incisions:  None. Abscess left lower extremity drained. Dressing intact Extremities:  All extremities with palpable pulses. Left lower extremity with +2 edema and abscess.  Abdomen:  Positive bowel sounds throughout, soft and non tender Neurologic: Patient barely speaks. Hx of dementia and non ambulatory Bed Bound.   CBC    Component Value Date/Time   WBC 4.9 04/29/2024 0746   RBC 3.59 (L) 04/29/2024 0746   HGB 10.6 (L) 04/29/2024 0746   HGB 13.1 01/10/2018 1228   HCT 31.4 (L) 04/29/2024 0746   HCT 39.1 01/10/2018 1228   PLT 113 (L) 04/29/2024 0746   PLT 199 01/10/2018 1228   MCV 87.5 04/29/2024 0746   MCV 87 01/10/2018 1228   MCV 89 07/16/2014 0951   MCH 29.5 04/29/2024 0746   MCHC 33.8 04/29/2024 0746   RDW 13.0 04/29/2024 0746   RDW 12.8 01/10/2018 1228   RDW 13.3 07/16/2014 0951   LYMPHSABS 0.8 04/27/2024 1732   LYMPHSABS 1.9 10/20/2015 1152   MONOABS 2.0 (H) 04/27/2024 1732   EOSABS 0.0 04/27/2024 1732   EOSABS 0.1 10/20/2015 1152   BASOSABS 0.0 04/27/2024 1732   BASOSABS 0.0 10/20/2015 1152    BMET    Component Value Date/Time   NA 140  04/27/2024 1732   NA 145 (H) 09/08/2020 1620   NA 139 07/30/2014 0633   K 3.6 04/27/2024 1732   K 4.0 07/30/2014 0633   CL 101 04/27/2024 1732   CL 104 07/30/2014 0633   CO2 28 04/27/2024 1732   CO2 30 07/30/2014 0633   GLUCOSE 91 04/27/2024 1732   GLUCOSE 101 (H) 07/30/2014 0633   BUN 11 04/27/2024 1732   BUN 16 09/08/2020 1620   BUN 8 07/30/2014 0633   CREATININE 0.47 04/29/2024 0746   CREATININE 0.95 09/16/2022 1342   CALCIUM 8.7 (L) 04/27/2024 1732   CALCIUM 8.5 07/30/2014 0633   GFRNONAA >60 04/29/2024 0746   GFRNONAA 61 01/03/2020 1130   GFRAA 61 09/08/2020 1620   GFRAA 71 01/03/2020 1130    INR    Component Value Date/Time   INR 1.0 07/16/2014 0951     Intake/Output Summary (Last 24 hours) at 04/29/2024 1308 Last data filed at 04/29/2024 1016 Gross per 24 hour  Intake 320 ml  Output 5 ml  Net 315 ml     Assessment/Plan:  89 y.o. female who presents to [facility] emergency department with left lower extremity edema and swelling, Noted abscess to left lower extremity.  1 Day Post-Op   After reviewing the patients history and chart vascular surgery recommends anticoagulation for the patients current DVT. The patients  age and he inability to lay flat on her back or prone for a period of time to undergo a thrombectomy makes her a candidate for low dose anticoagulation. I had a discussion with Dr Cresencio Fairly who spoke with the family who does not wish to treat the patient aggressively at this time. Therefore the plan is to discharge the patient home with hospice with eliquis low dose 2,5 mg bid and antibiotics     Current prophylaxis:  Heparin Infusion to be converted to oral anticoagulation   Susan Campos [facility] 04/29/2024 1:08 PM

## 2024-04-29 NOTE — Progress Notes (Signed)
 [facility], Room 246 [facility] Liaison Note  Received request from Marinda Reva PEAK , Transitions of Care Manager, for hospice services at home after discharge.  Spoke with son to initiate education related to hospice philosophy, services, and team approach to care.  Son  verbalized understanding of information given.  Per discussion, the plan is for discharge home tomorrow.   Patient has no DME needs in that she already has a hospital bed, air mattress, WC, hoyer lift, BSC and shower chair.   The address has been verified and is correct in the chart.   Please send signed and completed DNR home with the patient/family.  Please provide prescriptions at discharge as needed to ensure ongoing symptom management.   [facility] information and contact numbers given to son Above information shared with Marinda Reva PEAK, Transitions of Care Manager.     Please call with any Hospice related questions or concerns.  Thank you for the opportunity to participate in this patient's care.  Marinell Nova, [facility] Liaison [phone]

## 2024-04-29 NOTE — Progress Notes (Signed)
 PHARMACY - ANTICOAGULATION CONSULT NOTE  Pharmacy Consult for Heparin  Indication: DVT  Allergies  Allergen Reactions   Lovastatin Shortness Of Breath and Palpitations   Aricept [Donepezil Hcl]     dizziness    Patient Measurements: Height: 5' 3 (160 cm) Weight: 70.2 kg (154 lb 12.2 oz) IBW/kg (Calculated) : 52.4 HEPARIN DW (KG): 66.9  Vital Signs: Temp: 97.9 F (36.6 C) (09/29 0409) BP: 148/53 (09/29 0409) Pulse Rate: 62 (09/29 0409)  Labs: Recent Labs    04/27/24 1732 04/28/24 0601 04/28/24 1352 04/29/24 0746  HGB 11.5*  --   --  10.6*  HCT 35.5*  --   --  31.4*  PLT 117*  --   --  113*  HEPARINUNFRC  --  0.49 0.59 0.54  CREATININE 0.50  --  0.51 0.47    Estimated Creatinine Clearance: 45.7 mL/min (by C-G formula based on SCr of 0.47 mg/dL).   Medical History: Past Medical History:  Diagnosis Date   Arthritis    HIPS   Asthma    HX OF   Cancer (HCC)    skin   Hypertension    Orthopnea    sleeps in recliner, cannot lay flat on her back   Stroke [facility])    CVA 2012/ MEMORY LOSS, 1999, 1 more in 2011   Vertigo    OCCASIONAL    Medications:  Medications Prior to Admission  Medication Sig Dispense Refill Last Dose/Taking   acetaminophen  (TYLENOL ) 650 MG CR tablet Take by mouth every 8 (eight) hours as needed.    Unknown   albuterol  (VENTOLIN  HFA) 108 (90 Base) MCG/ACT inhaler INHALE 2 PUFFS INTO THE LUNGS EVERY 6 HOURS AS NEEDED FOR WHEEZING OR SHORTNESS OF BREATH 54 g 0 Unknown   aspirin EC 81 MG tablet Take 81 mg by mouth daily. 9 am   04/26/2024   calcium carbonate (TUMS - DOSED IN MG ELEMENTAL CALCIUM) 500 MG chewable tablet Chew by mouth as needed.   Unknown   cholecalciferol (VITAMIN D3) 25 MCG (1000 UNIT) tablet Take 1,000 Units by mouth daily.   04/26/2024   furosemide  (LASIX ) 20 MG tablet TAKE 1 TABLET(20 MG) BY MOUTH DAILY 90 tablet 1 04/26/2024   Multiple Vitamin (MULTIVITAMIN) capsule Take 1 capsule by mouth daily. 9 AM   Unknown   sertraline   (ZOLOFT ) 100 MG tablet Take 1 tablet (100 mg total) by mouth daily. 90 tablet 1 04/26/2024   simvastatin  (ZOCOR ) 20 MG tablet TAKE 1 TABLET(20 MG) BY MOUTH DAILY 90 tablet 1 04/26/2024   lisinopril  (ZESTRIL ) 10 MG tablet TAKE 1 TABLET(10 MG) BY MOUTH DAILY (Patient not taking: Reported on 04/27/2024) 90 tablet 1 Not Taking    Assessment: Pharmacy consulted to dose heparin in this 89 year old female admitted with DVT.  No prior anticoag noted. Hgb stable.   9/29 0946 HL 0.54.   Goal of Therapy:  Heparin level 0.3-0.7 units/ml Monitor platelets by anticoagulation protocol: Yes   Plan:  Heparin level is therapeutic. Will continue heparin infusion at 1150 units/hr. Recheck heparin level and CBC with AM labs.   Cathaleen GORMAN Blanch, PharmD, BCPS 04/29/2024,8:52 AM

## 2024-04-29 NOTE — Telephone Encounter (Signed)
 Patient Product/process development scientist completed.    The patient is insured through [employer]. Patient has Medicare and is not eligible for a copay card, but may be able to apply for patient assistance or Medicare RX Payment Plan (Patient Must reach out to their plan, if eligible for payment plan), if available.    Ran test claim for Eliquis 5 mg and the current 30 day co-pay is $40.00.  Ran test claim for Xarelto 20 mg and the current 30 day co-pay is $40.00.  This test claim was processed through [facility]- copay amounts may vary at other pharmacies due to pharmacy/plan contracts, or as the patient moves through the different stages of their insurance plan.     Roland Earl, CPHT Pharmacy Technician III Certified Patient Advocate [facility] Pharmacy Patient Advocate Team Direct Number: [phone]  Fax: [phone]

## 2024-04-29 NOTE — Progress Notes (Addendum)
 [facility] SURGICAL PROGRESS NOTE  Hospital Day(s): 1.   Post op day(s): 1 Day Post-Op.   Interval History:  Patient seen and examined No acute events or new complaints overnight.  Patient is hard of hearing; does not contribute to history much No fever Leg is painful expectedly No new labs this AM Cx without growth She is on Rocephin/Vancomycin   Vital signs in last 24 hours: [min-max] current  Temp:  [97 F (36.1 C)-98.2 F (36.8 C)] 97.9 F (36.6 C) (09/29 0409) Pulse Rate:  [60-73] 62 (09/29 0409) Resp:  [13-18] 16 (09/29 0409) BP: (104-148)/(52-71) 148/53 (09/29 0409) SpO2:  [93 %-100 %] 97 % (09/29 0409)     Height: 5' 3 (160 cm) Weight: 70.2 kg BMI (Calculated): 27.42   Intake/Output last 2 shifts:  09/28 0701 - 09/29 0700 In: 200 [I.V.:200] Out: 5 [Blood:5]   Physical Exam:  Constitutional: alert, cooperative and no distress  Respiratory: breathing non-labored at rest  Integumentary: I&D to left lateral calf, packing removed with serosanguinous drainage, this is tender expectedly, no evidence of residual abscess. Packing replaced.   Labs:     Latest Ref Rng & Units 04/29/2024    7:46 AM 04/27/2024    5:32 PM 09/16/2022    1:42 PM  CBC  WBC 4.0 - 10.5 K/uL 4.9  5.2  4.0   Hemoglobin 12.0 - 15.0 g/dL 89.3  88.4  87.0   Hematocrit 36.0 - 46.0 % 31.4  35.5  37.8   Platelets 150 - 400 K/uL 113  117  139       Latest Ref Rng & Units 04/29/2024    7:46 AM 04/28/2024    1:52 PM 04/27/2024    5:32 PM  CMP  Glucose 70 - 99 mg/dL   91   BUN 8 - 23 mg/dL   11   Creatinine 9.55 - 1.00 mg/dL 9.52  9.48  9.49   Sodium 135 - 145 mmol/L   140   Potassium 3.5 - 5.1 mmol/L   3.6   Chloride 98 - 111 mmol/L   101   CO2 22 - 32 mmol/L   28   Calcium 8.9 - 10.3 mg/dL   8.7   Total Protein 6.5 - 8.1 g/dL   6.4   Total Bilirubin 0.0 - 1.2 mg/dL   1.0   Alkaline Phos 38 - 126 U/L   102   AST 15 - 41 U/L   24   ALT 0 - 44 U/L   13      Imaging studies:  No new pertinent imaging studies   Assessment/Plan:  89 y.o. female 1 Day Post-Op s/p incision and drainage of left lateral calf abscess   - Wound Care: Pack left lateral calf wound with iodoform packing strips, cover with dry gauze, kerlix, secure. Wrap with ACE. This can be changed as needed. Okay to remove to shower. This should be able to be preformed by bedside RN at this point as well.   - Continue Abx; follow up Cx when available - Pain control prn   - Further management per primary service; we will remain available as needed. Please call/message with questions     All of the above findings and recommendations were discussed with the patient, patient's family at bedside, and the medical team, and all of patient's and family's questions were answered to their expressed satisfaction.  -- Arthea Platt, PA-C [facility] 04/29/2024, 8:46 AM M-F: 7am - 4pm

## 2024-04-29 NOTE — Progress Notes (Signed)
 Demonstrated dressing change including changing iodoform packing to granddaughter, who is involved in providing care for patient.

## 2024-04-30 ENCOUNTER — Other Ambulatory Visit (HOSPITAL_COMMUNITY): Payer: Self-pay

## 2024-04-30 ENCOUNTER — Other Ambulatory Visit: Payer: Self-pay

## 2024-04-30 DIAGNOSIS — Z515 Encounter for palliative care: Secondary | ICD-10-CM

## 2024-04-30 DIAGNOSIS — I8290 Acute embolism and thrombosis of unspecified vein: Principal | ICD-10-CM

## 2024-04-30 DIAGNOSIS — L8990 Pressure ulcer of unspecified site, unspecified stage: Secondary | ICD-10-CM | POA: Insufficient documentation

## 2024-04-30 DIAGNOSIS — L0291 Cutaneous abscess, unspecified: Secondary | ICD-10-CM

## 2024-04-30 LAB — CBC
HCT: 31.8 % — ABNORMAL LOW (ref 36.0–46.0)
Hemoglobin: 10.5 g/dL — ABNORMAL LOW (ref 12.0–15.0)
MCH: 30.1 pg (ref 26.0–34.0)
MCHC: 33 g/dL (ref 30.0–36.0)
MCV: 91.1 fL (ref 80.0–100.0)
Platelets: 145 K/uL — ABNORMAL LOW (ref 150–400)
RBC: 3.49 MIL/uL — ABNORMAL LOW (ref 3.87–5.11)
RDW: 13.1 % (ref 11.5–15.5)
WBC: 3.5 K/uL — ABNORMAL LOW (ref 4.0–10.5)
nRBC: 0 % (ref 0.0–0.2)

## 2024-04-30 MED ORDER — POLYETHYLENE GLYCOL 3350 17 G PO PACK
17.0000 g | PACK | Freq: Once | ORAL | Status: AC
  Administered 2024-04-30: 17 g via ORAL
  Filled 2024-04-30: qty 1

## 2024-04-30 MED ORDER — CEPHALEXIN 500 MG PO CAPS
500.0000 mg | ORAL_CAPSULE | Freq: Two times a day (BID) | ORAL | 0 refills | Status: DC
  Filled 2024-04-30: qty 14, 7d supply, fill #0

## 2024-04-30 MED ORDER — CIPROFLOXACIN HCL 500 MG PO TABS
500.0000 mg | ORAL_TABLET | Freq: Two times a day (BID) | ORAL | 0 refills | Status: AC
  Filled 2024-04-30: qty 14, 7d supply, fill #0

## 2024-04-30 MED ORDER — APIXABAN 2.5 MG PO TABS
2.5000 mg | ORAL_TABLET | Freq: Two times a day (BID) | ORAL | 0 refills | Status: AC
  Filled 2024-04-30: qty 40, 20d supply, fill #0
  Filled 2024-04-30: qty 60, 30d supply, fill #0
  Filled 2024-04-30: qty 20, 10d supply, fill #0

## 2024-04-30 MED ORDER — CIPROFLOXACIN HCL 500 MG PO TABS
500.0000 mg | ORAL_TABLET | Freq: Two times a day (BID) | ORAL | Status: DC
  Administered 2024-04-30: 500 mg via ORAL
  Filled 2024-04-30 (×2): qty 1

## 2024-04-30 NOTE — TOC Transition Note (Addendum)
 Transition of Care [facility]) - Discharge Note   Patient Details  Name: Susan Campos MRN: [id number] Date of Birth: 09-09-1932  [facility] CM/SW Contact:  Lauraine JAYSON Carpen, LCSW Phone Number: 04/30/2024, 10:17 AM   Clinical Narrative:  Patient has orders to discharge home with hospice today. [facility] liaison is aware. Per MD, family will transport patient home. No further concerns. CSW signing off.   10:35 am: Per team, patient will need ambulance transport home. CSW met with granddaughter at bedside and confirmed address on facesheet is correct. RN will notify CSW when transport can be set up.  1:29 pm: [facility] has been arranged. She is 6th or 7th on the list. Granddaughter is aware. Left voicemail for son. Granddaughter said one of her sons is already at the home. No further concerns. CSW signing off.  Final next level of care: Home w Hospice Care Barriers to Discharge: Barriers Resolved   Patient Goals and CMS Choice            Discharge Placement                    Patient and family notified of of transfer: 04/30/24  Discharge Plan and Services Additional resources added to the After Visit Summary for                                       Social Drivers of Health (SDOH) Interventions SDOH Screenings   Food Insecurity: No Food Insecurity (04/29/2024)  Housing: Low Risk  (04/29/2024)  Transportation Needs: Unknown (04/29/2024)  Utilities: Not At Risk (04/29/2024)  Alcohol Screen: Low Risk  (06/16/2023)  Depression (PHQ2-9): Medium Risk (08/14/2023)  Financial Resource Strain: Low Risk  (06/16/2023)  Physical Activity: Inactive (06/16/2023)  Social Connections: Unknown (04/29/2024)  Stress: No Stress Concern Present (06/16/2023)  Tobacco Use: Medium Risk (04/27/2024)  Health Literacy: Adequate Health Literacy (06/16/2023)     Readmission Risk Interventions     No data to display

## 2024-04-30 NOTE — Plan of Care (Signed)
  Problem: Clinical Measurements: Goal: Respiratory complications will improve Outcome: Progressing Goal: Cardiovascular complication will be avoided Outcome: Progressing   Problem: Pain Managment: Goal: General experience of comfort will improve and/or be controlled Outcome: Progressing   Problem: Clinical Measurements: Goal: Ability to avoid or minimize complications of infection will improve Outcome: Not Progressing   Problem: Skin Integrity: Goal: Skin integrity will improve Outcome: Not Progressing   Problem: Education: Goal: Knowledge of General Education information will improve Description: Including pain rating scale, medication(s)/side effects and non-pharmacologic comfort measures Outcome: Not Progressing   Problem: Activity: Goal: Risk for activity intolerance will decrease Outcome: Not Progressing

## 2024-04-30 NOTE — Care Management Important Message (Signed)
 Important Message  Patient Details  Name: Susan Campos MRN: [id number] Date of Birth: 10-20-1932   Important Message Given:  Yes - Medicare IM     [name] 04/30/2024, 4:44 PM

## 2024-04-30 NOTE — Progress Notes (Signed)
 Wound care performed by family member  Geni. Patient  tolerated well

## 2024-05-01 ENCOUNTER — Telehealth: Payer: Self-pay

## 2024-05-01 LAB — AEROBIC CULTURE W GRAM STAIN (SUPERFICIAL SPECIMEN): Special Requests: NORMAL

## 2024-05-01 NOTE — Transitions of Care (Post Inpatient/ED Visit) (Unsigned)
   05/01/2024  Name: Susan Campos MRN: [id number] DOB: Nov 19, 1932  Today's TOC FU Call Status: Today's TOC FU Call Status:: Unsuccessful Call (1st Attempt) Unsuccessful Call (1st Attempt) Date: 05/01/24  Attempted to reach the patient regarding the most recent Inpatient/ED visit.  Follow Up Plan: Additional outreach attempts will be made to reach the patient to complete the Transitions of Care (Post Inpatient/ED visit) call.   Signature  Avelina Essex, CMA (AAMA)  [facility]- [facility] Program [phone]

## 2024-05-01 NOTE — Discharge Summary (Signed)
 Physician Discharge Summary   Patient: Susan Campos MRN: [id number] DOB: Jun 03, 1933  Admit date:     04/27/2024  Discharge date: 04/30/2024  Discharge Physician: Cresencio Fairly   PCP: Antonette Angeline ORN, NP   Recommendations at discharge:    Home with Hospice  Discharge Diagnoses: Principal Problem:   Cellulitis and abscess of left leg Active Problems:   Acute nonocclusive DVT of left common femoral, superficial femoral and popliteal veins  (HCC)   History of multiple strokes   Mixed Alzheimer's and vascular dementia (HCC)   Hypertension   Depression   Frailty   Abscess   Acute deep vein thrombosis of left lower extremity (HCC)   Venous thrombosis   Hospice care   Pressure injury of skin  Hospital Course: Assessment and Plan:  89 y.o. female with medical history significant for HTN, HLD, OA, prior pontine stroke 2017 and multiple lacunar strokes, with residual ambulatory dysfunction, bedbound for the past 4 months, as well as history of vascular dementia, cared for at home by her son who is being admitted with cellulitis and abscess left anterior lower leg as well as an extensive nonocclusive DVT of the same leg.  Son noticed an area of redness and swelling a few days prior which has progressed and then started draining purulent material on the day of arrival thus prompting the visit to the ED.    X-ray tib-fib showed diffuse subcutaneous soft tissue edema   Nonvascular ultrasound left lower extremity a 4.9 cm abscess centimeter abscess left anterior mid lower leg   Venous ultrasound showing a nonocclusive thrombus left common femoral, superficial femoral and popliteal veins   Cellulitis and abscess of left leg/calf abscess --Abscess spontaneously draining --Treated with IV Rocephin and vancomycin while in the Hospital --[facility] negative - Status post I&D of the left lateral calf abscess on 9/28 - Pack left lateral calf wound with iodoform packing strips, cover with dry  gauze, kerlix, secure. Wrap with ACE. This can be changed as needed. Okay to remove to shower. Family teaching done by bedside RN at this point as well.  -Outpatient follow-up with surgery in 2 weeks   Acute nonocclusive DVT of left common femoral, superficial femoral and popliteal veins  (HCC) --Likely provoked as patient is currently nonambulant/bedbound -- Discussed with vascular surgery.  Patient will be unable to tolerate thrombectomy or any major surgery.  -They recommend transitioning her to oral anticoagulant/DOAC.  They recommend Eliquis 2.5 mg p.o. twice daily. Family in agreement   History of multiple strokes --Holding aspirin for I & D - resume at discharge --Does not appear to be on statins-listed lovastatin allergy   Mixed Alzheimer's and vascular dementia (HCC) Frailty/advanced age/nonambulant x 4 months, previously ambulated with walker Depression --Continue sertraline , trazodone  --Spoke with son Cyndee: Patient has 24-hour caregiver.   - Son is agreeable for hospice at home   Hypertension -- Controlled       Consultants: Surgery Procedures performed: I & D on 9/28  Disposition: Hospice care Diet recommendation:  Discharge Diet Orders (From admission, onward)     Start     Ordered   04/30/24 0000  Diet - low sodium heart healthy        04/30/24 0959           Carb modified diet DISCHARGE MEDICATION: Allergies as of 04/30/2024       Reactions   Lovastatin Shortness Of Breath, Palpitations   Aricept [donepezil Hcl]    dizziness  Medication List     STOP taking these medications    lisinopril  10 MG tablet Commonly known as: ZESTRIL        TAKE these medications    acetaminophen  650 MG CR tablet Commonly known as: TYLENOL  Take by mouth every 8 (eight) hours as needed.   albuterol  108 (90 Base) MCG/ACT inhaler Commonly known as: VENTOLIN  HFA INHALE 2 PUFFS INTO THE LUNGS EVERY 6 HOURS AS NEEDED FOR WHEEZING OR SHORTNESS OF BREATH    aspirin EC 81 MG tablet Take 81 mg by mouth daily. 9 am   calcium carbonate 500 MG chewable tablet Commonly known as: TUMS - dosed in mg elemental calcium Chew by mouth as needed.   cholecalciferol 25 MCG (1000 UNIT) tablet Commonly known as: VITAMIN D3 Take 1,000 Units by mouth daily.   ciprofloxacin  500 MG tablet Commonly known as: CIPRO  Take 1 tablet (500 mg total) by mouth 2 (two) times daily for 7 days.   Eliquis 2.5 MG Tabs tablet Generic drug: apixaban Take 1 tablet (2.5 mg total) by mouth 2 (two) times daily.   furosemide  20 MG tablet Commonly known as: LASIX  TAKE 1 TABLET(20 MG) BY MOUTH DAILY   multivitamin capsule Take 1 capsule by mouth daily. 9 AM   sertraline  100 MG tablet Commonly known as: ZOLOFT  Take 1 tablet (100 mg total) by mouth daily.   simvastatin  20 MG tablet Commonly known as: ZOCOR  TAKE 1 TABLET(20 MG) BY MOUTH DAILY               Discharge Care Instructions  (From admission, onward)           Start     Ordered   04/30/24 0000  Discharge wound care:       Comments: As above   04/30/24 9040            Follow-up Information     Antonette Angeline ORN, NP. Schedule an appointment as soon as possible for a visit in 1 week(s).   Specialties: Internal Medicine, Emergency Medicine Why: [facility] Discharge F/UP Contact information: [address] [place] [address] [phone]         Terryl Arthea SAUNDERS, PA-C. Go on 05/08/2024.   Specialty: Physician Assistant Why: 10/8 2:45 p.m. [facility] Discharge F/UP Contact information: [address] [place] [address] [phone]                Discharge Exam: [name]   04/27/24 1849  Weight: 70.2 kg   GENERAL:  89 y.o.-year-old patient with no acute distress.  LUNGS: Normal breath sounds bilaterally CARDIOVASCULAR: S1, S2 normal. No murmur   ABDOMEN: Soft, nontender, nondistended. Bowel sounds present.  EXTREMITIES: I&D to left lateral calf,  packing removed with serosanguinous drainage, this is tender expectedly, no evidence of residual abscess. Packing replaced.  NEUROLOGIC: nonfocal  patient is alert and awake  Condition at discharge: fair  The results of significant diagnostics from this hospitalization (including imaging, microbiology, ancillary and laboratory) are listed below for reference.   Imaging Studies: US  LT LOWER EXTREM LTD SOFT TISSUE NON VASCULAR Result Date: 04/27/2024 EXAM: US  left Lower Extremity Nonvascular Soft Tissue Ultrasound TECHNIQUE: Real-time ultrasound scan of the left lower extremity with image documentation. COMPARISON: None provided. CLINICAL HISTORY: 407327 Wound check, abscess [id number]. Wound check, abscess [id number]. FINDINGS: SOFT TISSUES: Targeted ultrasound of the left lower extremity in the area of clinical concern on the anterior mid lower leg demonstrates an irregular hypoechoic fluid collection measuring  approximately 4.5 x 1.6 x 4.9 cm. Adjacent hyperemia. Findings are compatible with abscess. No solid mass. IMPRESSION: 1. 4.9 cm abscess  in the left anterior mid lower leg, compatible with abscess. Electronically signed by: Norman Gatlin MD 04/27/2024 08:01 PM EDT RP Workstation: [id number]   US  Venous Img Lower Unilateral Left Result Date: 04/27/2024 CLINICAL DATA:  Lower extremity edema EXAM: LEFT LOWER EXTREMITY VENOUS DOPPLER ULTRASOUND TECHNIQUE: Gray-scale sonography with graded compression, as well as color Doppler and duplex ultrasound were performed to evaluate the lower extremity deep venous systems from the level of the common femoral vein and including the common femoral, femoral, profunda femoral, popliteal and calf veins including the posterior tibial, peroneal and gastrocnemius veins when visible. The superficial great saphenous vein was also interrogated. Spectral Doppler was utilized to evaluate flow at rest and with distal augmentation maneuvers in the common femoral, femoral and  popliteal veins. COMPARISON:  None Available. FINDINGS: Contralateral Common Femoral Vein: No thrombus. Normal compressibility and augmentation. Common Femoral Vein: Nonocclusive thrombus. Saphenofemoral Junction: No evidence of thrombus. Normal compressibility and flow on color Doppler imaging. Profunda Femoral Vein: No evidence of thrombus. Normal compressibility and flow on color Doppler imaging. Femoral Vein: Nonocclusive thrombus. Popliteal Vein: Nonocclusive thrombus. Calf Veins: Not visualized Superficial Great Saphenous Vein: Not visualized. Venous Reflux:  None. Other Findings:  None. IMPRESSION: Nonocclusive thrombus noted in the left common femoral, superficial femoral and popliteal veins. Electronically Signed   By: Franky Crease M.D.   On: 04/27/2024 19:59   DG Tibia/Fibula Left Result Date: 04/27/2024 CLINICAL DATA:  edema, swelling, dvt exclude, eval for deep infection EXAM: LEFT TIBIA AND FIBULA - 2 VIEW COMPARISON:  X-ray left knee 07/28/2014 FINDINGS: No cortical erosion or destruction. Total left knee arthroplasty. There is no evidence of fracture or other focal bone lesions. Diffusely decreased bone density of the bones of the leg and ankle. Diffusely decreased bone density. IMPRESSION: 1. Diffuse subcutaneus soft tissue edema. 2.  No radiographic findings to suggest osteomyelitis. 3. No acute displaced fracture or dislocation. 4. Total left knee arthroplasty. 5. Diffusely decreased bone density. Electronically Signed   By: Morgane  Naveau M.D.   On: 04/27/2024 19:55    Microbiology: Results for orders placed or performed during the hospital encounter of 04/27/24  Blood culture (routine x 2)     Status: None (Preliminary result)   Collection Time: 04/27/24  5:32 PM   Specimen: BLOOD  Result Value Ref Range Status   Specimen Description BLOOD BLOOD RIGHT FOREARM  Final   Special Requests   Final    BOTTLES DRAWN AEROBIC AND ANAEROBIC Blood Culture adequate volume   Culture   Final     NO GROWTH 3 DAYS Performed at [facility], [address], [place], [place] [address]    Report Status PENDING  Incomplete  Blood culture (routine x 2)     Status: None (Preliminary result)   Collection Time: 04/27/24  6:06 PM   Specimen: BLOOD  Result Value Ref Range Status   Specimen Description BLOOD LEFT ANTECUBITAL  Final   Special Requests   Final    BOTTLES DRAWN AEROBIC ONLY Blood Culture results may not be optimal due to an inadequate volume of blood received in culture bottles   Culture   Final    NO GROWTH 3 DAYS Performed at [facility], [address], [place], [place] [address]    Report Status PENDING  Incomplete  Aerobic Culture w Gram Stain (superficial specimen)  Status: None   Collection Time: 04/28/24 12:06 AM   Specimen: Wound  Result Value Ref Range Status   Specimen Description   Final    WOUND Performed at [facility], [address] [place], [place] [address]    Special Requests   Final    Normal Performed at [facility], [address] [place], [place] [address]    Gram Stain   Final    RARE WBC PRESENT, PREDOMINANTLY PMN NO ORGANISMS SEEN Performed at [facility], [address], [place], [place] [address]    Culture   Final    MODERATE ESCHERICHIA COLI FEW PSEUDOMONAS STUTZERI    Report Status 05/01/2024 FINAL  Final   Organism ID, Bacteria ESCHERICHIA COLI  Final   Organism ID, Bacteria PSEUDOMONAS STUTZERI  Final      Susceptibility   Escherichia coli - MIC*    AMPICILLIN <=2 SENSITIVE Sensitive     CEFAZOLIN (NON-URINE) <=1 SENSITIVE Sensitive     CEFEPIME <=0.12 SENSITIVE Sensitive     ERTAPENEM <=0.12 SENSITIVE Sensitive     CEFTRIAXONE <=0.25 SENSITIVE Sensitive     CIPROFLOXACIN  <=0.06 SENSITIVE Sensitive     GENTAMICIN <=1 SENSITIVE Sensitive     MEROPENEM <=0.25 SENSITIVE Sensitive     TRIMETH/SULFA <=20 SENSITIVE Sensitive     AMPICILLIN/SULBACTAM <=2 SENSITIVE Sensitive      PIP/TAZO Value in next row Sensitive      <=4 SENSITIVEThis is a modified FDA-approved test that has been validated and its performance characteristics determined by the reporting laboratory.  This laboratory is certified under the Clinical Laboratory Improvement Amendments CLIA as qualified to perform high complexity clinical laboratory testing.    * MODERATE ESCHERICHIA COLI   Pseudomonas stutzeri - MIC*    MEROPENEM Value in next row Sensitive      <=4 SENSITIVEThis is a modified FDA-approved test that has been validated and its performance characteristics determined by the reporting laboratory.  This laboratory is certified under the Clinical Laboratory Improvement Amendments CLIA as qualified to perform high complexity clinical laboratory testing.    CIPROFLOXACIN  Value in next row Sensitive      <=4 SENSITIVEThis is a modified FDA-approved test that has been validated and its performance characteristics determined by the reporting laboratory.  This laboratory is certified under the Clinical Laboratory Improvement Amendments CLIA as qualified to perform high complexity clinical laboratory testing.    PIP/TAZO Value in next row Sensitive      <=4 SENSITIVEThis is a modified FDA-approved test that has been validated and its performance characteristics determined by the reporting laboratory.  This laboratory is certified under the Clinical Laboratory Improvement Amendments CLIA as qualified to perform high complexity clinical laboratory testing.    CEFEPIME Value in next row Sensitive      <=4 SENSITIVEThis is a modified FDA-approved test that has been validated and its performance characteristics determined by the reporting laboratory.  This laboratory is certified under the Clinical Laboratory Improvement Amendments CLIA as qualified to perform high complexity clinical laboratory testing.    * FEW PSEUDOMONAS STUTZERI    Labs: CBC: Recent Labs  Lab 04/27/24 1732 04/29/24 0746  04/30/24 0515  WBC 5.2 4.9 3.5*  NEUTROABS 2.4  --   --   HGB 11.5* 10.6* 10.5*  HCT 35.5* 31.4* 31.8*  MCV 90.8 87.5 91.1  PLT 117* 113* 145*   Basic Metabolic Panel: Recent Labs  Lab 04/27/24 1732 04/28/24 1352 04/29/24 0746  NA 140  --   --  K 3.6  --   --   CL 101  --   --   CO2 28  --   --   GLUCOSE 91  --   --   BUN 11  --   --   CREATININE 0.50 0.51 0.47  CALCIUM 8.7*  --   --    Liver Function Tests: Recent Labs  Lab 04/27/24 1732  AST 24  ALT 13  ALKPHOS 102  BILITOT 1.0  PROT 6.4*  ALBUMIN 2.8*   CBG: No results for input(s): GLUCAP in the last 168 hours.  Discharge time spent: greater than 30 minutes.  Signed: Cresencio Fairly, MD [facility] 05/01/2024

## 2024-05-03 LAB — CULTURE, BLOOD (ROUTINE X 2)
Culture: NO GROWTH
Culture: NO GROWTH
Special Requests: ADEQUATE

## 2024-05-07 ENCOUNTER — Inpatient Hospital Stay: Admitting: Internal Medicine

## 2024-05-07 NOTE — Progress Notes (Deleted)
 Subjective:    Patient ID: Susan Campos, female    DOB: 02-Apr-1933, 89 y.o.   MRN: [id number]  HPI    Review of Systems     Past Medical History:  Diagnosis Date   Arthritis    HIPS   Asthma    HX OF   Cancer (HCC)    skin   Hypertension    Orthopnea    sleeps in recliner, cannot lay flat on her back   Stroke [facility])    CVA 2012/ MEMORY LOSS, 1999, 1 more in 2011   Vertigo    OCCASIONAL    Current Outpatient Medications  Medication Sig Dispense Refill   acetaminophen  (TYLENOL ) 650 MG CR tablet Take by mouth every 8 (eight) hours as needed.      albuterol  (VENTOLIN  HFA) 108 (90 Base) MCG/ACT inhaler INHALE 2 PUFFS INTO THE LUNGS EVERY 6 HOURS AS NEEDED FOR WHEEZING OR SHORTNESS OF BREATH 54 g 0   apixaban (ELIQUIS) 2.5 MG TABS tablet Take 1 tablet (2.5 mg total) by mouth 2 (two) times daily. 60 tablet 0   aspirin EC 81 MG tablet Take 81 mg by mouth daily. 9 am     calcium carbonate (TUMS - DOSED IN MG ELEMENTAL CALCIUM) 500 MG chewable tablet Chew by mouth as needed.     cholecalciferol (VITAMIN D3) 25 MCG (1000 UNIT) tablet Take 1,000 Units by mouth daily.     ciprofloxacin  (CIPRO ) 500 MG tablet Take 1 tablet (500 mg total) by mouth 2 (two) times daily for 7 days. 14 tablet 0   furosemide  (LASIX ) 20 MG tablet TAKE 1 TABLET(20 MG) BY MOUTH DAILY 90 tablet 1   Multiple Vitamin (MULTIVITAMIN) capsule Take 1 capsule by mouth daily. 9 AM     sertraline  (ZOLOFT ) 100 MG tablet Take 1 tablet (100 mg total) by mouth daily. 90 tablet 1   simvastatin  (ZOCOR ) 20 MG tablet TAKE 1 TABLET(20 MG) BY MOUTH DAILY 90 tablet 1   No current facility-administered medications for this visit.    Allergies  Allergen Reactions   Lovastatin Shortness Of Breath and Palpitations   Aricept [Donepezil Hcl]     dizziness    Family History  Problem Relation Age of Onset   Heart attack Mother    Heart attack Father    Heart attack Paternal Grandmother    Skin cancer Paternal  Grandmother     Social History   Socioeconomic History   Marital status: Married    Spouse name: Not on file   Number of children: 2   Years of education: Not on file   Highest education level: Associate degree: occupational, Scientist, product/process development, or vocational program  Occupational History   Occupation: Retired  Tobacco Use   Smoking status: Former    Current packs/day: 0.50    Average packs/day: 0.5 packs/day for 4.0 years (2.0 ttl pk-yrs)    Types: Cigarettes   Smokeless tobacco: Never   Tobacco comments:    1979  Vaping Use   Vaping status: Never Used  Substance and Sexual Activity   Alcohol use: No   Drug use: No   Sexual activity: Not on file  Other Topics Concern   Not on file  Social History Narrative   Not on file   Social Drivers of Health   Financial Resource Strain: Low Risk  (06/16/2023)   Overall Financial Resource Strain (CARDIA)    Difficulty of Paying Living Expenses: Not hard at all  Food Insecurity: No Food  Insecurity (04/29/2024)   Hunger Vital Sign    Worried About Running Out of Food in the Last Year: Never true    Ran Out of Food in the Last Year: Never true  Transportation Needs: Unknown (04/29/2024)   PRAPARE - Administrator, Civil Service (Medical): Not on file    Lack of Transportation (Non-Medical): No  Physical Activity: Inactive (06/16/2023)   Exercise Vital Sign    Days of Exercise per Week: 0 days    Minutes of Exercise per Session: 0 min  Stress: No Stress Concern Present (06/16/2023)   [employer] of Occupational Health - Occupational Stress Questionnaire    Feeling of Stress : Not at all  Social Connections: Unknown (04/29/2024)   Social Connection and Isolation Panel    Frequency of Communication with Friends and Family: Three times a week    Frequency of Social Gatherings with Friends and Family: More than three times a week    Attends Religious Services: Patient declined    Active Member of Clubs or Organizations: No     Attends Banker Meetings: Never    Marital Status: Patient declined  Intimate Partner Violence: Unknown (04/29/2024)   Humiliation, Afraid, Rape, and Kick questionnaire    Fear of Current or Ex-Partner: No    Emotionally Abused: No    Physically Abused: Not on file    Sexually Abused: No     Constitutional: Denies fever, malaise, fatigue, headache or abrupt weight changes.  HEENT: Denies eye pain, eye redness, ear pain, ringing in the ears, wax buildup, runny nose, nasal congestion, bloody nose, or sore throat. Respiratory: Denies difficulty breathing, shortness of breath, cough or sputum production.   Cardiovascular: Denies chest pain, chest tightness, palpitations or swelling in the hands or feet.  Gastrointestinal: Denies abdominal pain, bloating, constipation, diarrhea or blood in the stool.  GU: Denies urgency, frequency, pain with urination, burning sensation, blood in urine, odor or discharge. Musculoskeletal: Patient reports joint pain in knees, difficulty with gait.  Denies decrease in range of motion, muscle pain or joint swelling.  Skin: Denies redness, rashes, lesions or ulcercations.  Neurological: Patient reports difficulty with memory, insomnia.  Denies dizziness, difficulty with speech or problems with balance and coordination.  Psych: Patient has a history of depression.  Denies anxiety, SI/HI.  No other specific complaints in a complete review of systems (except as listed in HPI above).  Objective:   Physical Exam There were no vitals taken for this visit.   Wt Readings from Last 3 Encounters:  04/27/24 154 lb 12.2 oz (70.2 kg)  08/18/22 172 lb (78 kg)  06/10/22 172 lb (78 kg)    General: Appears her stated age, chronically ill appearing in NAD. Skin: Warm, dry and intact.  HEENT: Head: normal shape and size; Eyes: sclera white, no icterus, conjunctiva pink, PERRLA and EOMs intact;  Cardiovascular: Normal rate and rhythm. S1,S2 noted.  No  murmur, rubs or gallops noted. 1+ nonpitting BLE edema. No carotid bruits noted. Pulmonary/Chest: Normal effort and positive vesicular breath sounds. No respiratory distress. No wheezes, rales or ronchi noted.  Abdomen: Normal bowel sounds.  Musculoskeletal: In wheelchair today. Neurological: Alert. Difficulty with recall. Repeats herself frequently. Psychiatric: Mood and affect normal. Behavior is normal. Judgment and thought content normal.    BMET    Component Value Date/Time   NA 140 04/27/2024 1732   NA 145 (H) 09/08/2020 1620   NA 139 07/30/2014 0633   K 3.6 04/27/2024 1732  K 4.0 07/30/2014 0633   CL 101 04/27/2024 1732   CL 104 07/30/2014 0633   CO2 28 04/27/2024 1732   CO2 30 07/30/2014 0633   GLUCOSE 91 04/27/2024 1732   GLUCOSE 101 (H) 07/30/2014 0633   BUN 11 04/27/2024 1732   BUN 16 09/08/2020 1620   BUN 8 07/30/2014 0633   CREATININE 0.47 04/29/2024 0746   CREATININE 0.95 09/16/2022 1342   CALCIUM 8.7 (L) 04/27/2024 1732   CALCIUM 8.5 07/30/2014 0633   GFRNONAA >60 04/29/2024 0746   GFRNONAA 61 01/03/2020 1130   GFRAA 61 09/08/2020 1620   GFRAA 71 01/03/2020 1130    Lipid Panel     Component Value Date/Time   CHOL 172 09/16/2022 1342   CHOL 206 (H) 01/10/2018 1228   TRIG 182 (H) 09/16/2022 1342   HDL 37 (L) 09/16/2022 1342   HDL 35 (L) 01/10/2018 1228   CHOLHDL 4.6 09/16/2022 1342   LDLCALC 105 (H) 09/16/2022 1342    CBC    Component Value Date/Time   WBC 3.5 (L) 04/30/2024 0515   RBC 3.49 (L) 04/30/2024 0515   HGB 10.5 (L) 04/30/2024 0515   HGB 13.1 01/10/2018 1228   HCT 31.8 (L) 04/30/2024 0515   HCT 39.1 01/10/2018 1228   PLT 145 (L) 04/30/2024 0515   PLT 199 01/10/2018 1228   MCV 91.1 04/30/2024 0515   MCV 87 01/10/2018 1228   MCV 89 07/16/2014 0951   MCH 30.1 04/30/2024 0515   MCHC 33.0 04/30/2024 0515   RDW 13.1 04/30/2024 0515   RDW 12.8 01/10/2018 1228   RDW 13.3 07/16/2014 0951   LYMPHSABS 0.8 04/27/2024 1732   LYMPHSABS 1.9  10/20/2015 1152   MONOABS 2.0 (H) 04/27/2024 1732   EOSABS 0.0 04/27/2024 1732   EOSABS 0.1 10/20/2015 1152   BASOSABS 0.0 04/27/2024 1732   BASOSABS 0.0 10/20/2015 1152    Hgb A1C Lab Results  Component Value Date   HGBA1C 5.4 09/16/2022           Assessment & Plan:     Schedule an appointment for your annual exam Angeline Laura, NP

## 2024-05-08 ENCOUNTER — Encounter: Admitting: Physician Assistant

## 2024-05-10 ENCOUNTER — Other Ambulatory Visit: Payer: Self-pay

## 2024-05-13 ENCOUNTER — Encounter: Payer: Self-pay | Admitting: Internal Medicine

## 2024-06-11 ENCOUNTER — Other Ambulatory Visit: Payer: Self-pay | Admitting: Internal Medicine

## 2024-06-13 NOTE — Telephone Encounter (Signed)
 Courtesy refill. Patient will need an office visit for additional refills.  Requested Prescriptions  Pending Prescriptions Disp Refills   sertraline  (ZOLOFT ) 100 MG tablet [Pharmacy Med Name: SERTRALINE  100MG  TABLETS] 30 tablet 0    Sig: TAKE 1 TABLET(100 MG) BY MOUTH DAILY     Psychiatry:  Antidepressants - SSRI - sertraline  Failed - 06/13/2024  3:16 PM      Failed - Valid encounter within last 6 months    Recent Outpatient Visits   None            Passed - AST in normal range and within 360 days    AST  Date Value Ref Range Status  04/27/2024 24 15 - 41 U/L Final   SGOT(AST)  Date Value Ref Range Status  10/19/2011 24 15 - 37 Unit/L Final         Passed - ALT in normal range and within 360 days    ALT  Date Value Ref Range Status  04/27/2024 13 0 - 44 U/L Final   SGPT (ALT)  Date Value Ref Range Status  10/19/2011 27 U/L Final    Comment:    12-78 NOTE: NEW REFERENCE RANGE 06/24/2011          Passed - Completed PHQ-2 or PHQ-9 in the last 360 days

## 2024-06-21 ENCOUNTER — Ambulatory Visit: Payer: Self-pay

## 2024-06-21 VITALS — BP 150/92

## 2024-06-21 DIAGNOSIS — Z0000 Encounter for general adult medical examination without abnormal findings: Secondary | ICD-10-CM | POA: Diagnosis not present

## 2024-06-21 NOTE — Progress Notes (Signed)
 No chief complaint on file.    Subjective:   Susan Campos is a 89 y.o. female who presents for a Medicare Annual Wellness Visit.  Allergies (verified) Lovastatin and Aricept [donepezil hcl]   History: Past Medical History:  Diagnosis Date   Arthritis    HIPS   Asthma    HX OF   Cancer (HCC)    skin   Hypertension    Orthopnea    sleeps in recliner, cannot lay flat on her back   Stroke [facility])    CVA 2012/ MEMORY LOSS, 1999, 1 more in 2011   Vertigo    OCCASIONAL   Past Surgical History:  Procedure Laterality Date   CATARACT EXTRACTION Right    CATARACT EXTRACTION W/PHACO Left 05/27/2015   Procedure: CATARACT EXTRACTION PHACO AND INTRAOCULAR LENS PLACEMENT (IOC);  Surgeon: Dene Etienne, MD;  Location: [facility];  Service: Ophthalmology;  Laterality: Left;   INCISION AND DRAINAGE ABSCESS Left 04/28/2024   Procedure: INCISION AND DRAINAGE, ABSCESS;  Surgeon: Lane Shope, MD;  Location: [facility];  Service: General;  Laterality: Left;  left lateral calf   KNEE ARTHROSCOPY     MELANOMA EXCISION     from chest   MOUTH SURGERY     REPLACEMENT TOTAL KNEE BILATERAL     UTERINE SUSPENSION     Family History  Problem Relation Age of Onset   Heart attack Mother    Heart attack Father    Heart attack Paternal Grandmother    Skin cancer Paternal Grandmother    Social History   Occupational History   Occupation: Retired  Tobacco Use   Smoking status: Former    Current packs/day: 0.50    Average packs/day: 0.5 packs/day for 4.0 years (2.0 ttl pk-yrs)    Types: Cigarettes   Smokeless tobacco: Never   Tobacco comments:    1979  Vaping Use   Vaping status: Never Used  Substance and Sexual Activity   Alcohol use: No   Drug use: No   Sexual activity: Not on file   Tobacco Counseling Counseling given: Not Answered Tobacco comments: 1979  SDOH Screenings   Food Insecurity: No Food Insecurity (04/29/2024)  Housing: Low Risk  (04/29/2024)   Transportation Needs: Unknown (04/29/2024)  Utilities: Not At Risk (04/29/2024)  Alcohol Screen: Low Risk  (06/16/2023)  Depression (PHQ2-9): Medium Risk (08/14/2023)  Financial Resource Strain: Low Risk  (06/16/2023)  Physical Activity: Inactive (06/16/2023)  Social Connections: Unknown (04/29/2024)  Stress: No Stress Concern Present (06/16/2023)  Tobacco Use: Medium Risk (04/27/2024)  Health Literacy: Adequate Health Literacy (06/16/2023)   See flowsheets for full screening details  Depression Screen PHQ 2 & 9 Depression Scale- Over the past 2 weeks, how often have you been bothered by any of the following problems? Little interest or pleasure in doing things: 0 Feeling down, depressed, or hopeless (PHQ Adolescent also includes...irritable): 0 PHQ-2 Total Score: 0 Trouble falling or staying asleep, or sleeping too much: 2 Feeling tired or having little energy: 0 Poor appetite or overeating (PHQ Adolescent also includes...weight loss): 0 Feeling bad about yourself - or that you are a failure or have let yourself or your family down: 2 Trouble concentrating on things, such as reading the newspaper or watching television (PHQ Adolescent also includes...like school work): 2 Moving or speaking so slowly that other people could have noticed. Or the opposite - being so fidgety or restless that you have been moving around a lot more than usual: 1 Thoughts that  you would be better off dead, or of hurting yourself in some way: 0 PHQ-9 Total Score: 7 If you checked off any problems, how difficult have these problems made it for you to do your work, take care of things at home, or get along with other people?: Not difficult at all     Goals Addressed   None    Fall Screening Falls in the past year?: 0 Was there an injury with Fall?: 0 Patient Fall Risk Level: High fall risk  Advance Directives (For Healthcare) Does Patient Have a Medical Advance Directive?: Unable to assess, patient is  non-responsive or altered mental status        Objective:    There were no vitals filed for this visit. There is no height or weight on file to calculate BMI.  Current Medications (verified) Outpatient Encounter Medications as of 06/21/2024  Medication Sig   acetaminophen  (TYLENOL ) 650 MG CR tablet Take by mouth every 8 (eight) hours as needed.    albuterol  (VENTOLIN  HFA) 108 (90 Base) MCG/ACT inhaler INHALE 2 PUFFS INTO THE LUNGS EVERY 6 HOURS AS NEEDED FOR WHEEZING OR SHORTNESS OF BREATH   apixaban  (ELIQUIS ) 2.5 MG TABS tablet Take 1 tablet (2.5 mg total) by mouth 2 (two) times daily.   aspirin EC 81 MG tablet Take 81 mg by mouth daily. 9 am   calcium carbonate (TUMS - DOSED IN MG ELEMENTAL CALCIUM) 500 MG chewable tablet Chew by mouth as needed.   cholecalciferol  (VITAMIN D3) 25 MCG (1000 UNIT) tablet Take 1,000 Units by mouth daily.   furosemide  (LASIX ) 20 MG tablet TAKE 1 TABLET(20 MG) BY MOUTH DAILY   Multiple Vitamin (MULTIVITAMIN) capsule Take 1 capsule by mouth daily. 9 AM   sertraline  (ZOLOFT ) 100 MG tablet TAKE 1 TABLET(100 MG) BY MOUTH DAILY   simvastatin  (ZOCOR ) 20 MG tablet TAKE 1 TABLET(20 MG) BY MOUTH DAILY   No facility-administered encounter medications on file as of 06/21/2024.   Hearing/Vision screen No results found. Immunizations and Health Maintenance Health Maintenance  Topic Date Due   Zoster Vaccines- Shingrix (1 of 2) 05/30/1985   Bone Density Scan  07/23/2017   Influenza Vaccine  03/01/2024   DTaP/Tdap/Td (3 - Td or Tdap) 12/11/2032   Pneumococcal Vaccine: 50+ Years  Completed   Meningococcal B Vaccine  Aged Out   COVID-19 Vaccine  Discontinued        Assessment/Plan:  This is a routine wellness examination for [name].  Patient Care Team: Antonette Angeline ORN, NP as PCP - General (Internal Medicine) Mittie Gaskin, MD as Referring Physician (Ophthalmology) Maree Jannett POUR, MD as Consulting Physician (Neurology) Merlynn Lyle CROME, LCSW as  Social Worker (Licensed Clinical Social Worker) Celestia Gustav BRAVO, RMA Pll[facility], [facility]  I have personally reviewed and noted the following in the patient's chart:   Medical and social history Use of alcohol, tobacco or illicit drugs  Current medications and supplements including opioid prescriptions. Functional ability and status Nutritional status Physical activity Advanced directives List of other physicians Hospitalizations, surgeries, and ER visits in previous 12 months Vitals Screenings to include cognitive, depression, and falls Referrals and appointments  No orders of the defined types were placed in this encounter.  In addition, I have reviewed and discussed with patient certain preventive protocols, quality metrics, and best practice recommendations. A written personalized care plan for preventive services as well as general preventive health recommendations were provided to patient.   Susan GORMAN Das, LPN   88/[id number]/7974  No follow-ups on file.  After Visit Summary: (MyChart) Due to this being a telephonic visit, the after visit summary with patients personalized plan was offered to patient via MyChart   Nurse Notes: pt is non-ambulatory, w/ dementia NEEDS FLU SHOT; AGED OUT OF MAMMOGRAM, COLONOSCOPY & BDS

## 2024-06-21 NOTE — Patient Instructions (Addendum)
 Ms. Gwilliam,  Thank you for taking the time for your Medicare Wellness Visit. I appreciate your continued commitment to your health goals. Please review the care plan we discussed, and feel free to reach out if I can assist you further.  Please note that Annual Wellness Visits do not include a physical exam. Some assessments may be limited, especially if the visit was conducted virtually. If needed, we may recommend an in-person follow-up with your provider.  Ongoing Care Seeing your primary care provider every 3 to 6 months helps us  monitor your health and provide consistent, personalized care.   Referrals If a referral was made during today's visit and you haven't received any updates within two weeks, please contact the referred provider directly to check on the status.  Recommended Screenings:  Health Maintenance  Topic Date Due   Zoster (Shingles) Vaccine (1 of 2) 05/30/1985   Osteoporosis screening with Bone Density Scan  07/23/2017   Flu Shot  03/01/2024   DTaP/Tdap/Td vaccine (3 - Td or Tdap) 12/11/2032   Pneumococcal Vaccine for age over 60  Completed   Meningitis B Vaccine  Aged Out   COVID-19 Vaccine  Discontinued     Vision: Annual vision screenings are recommended for early detection of glaucoma, cataracts, and diabetic retinopathy. These exams can also reveal signs of chronic conditions such as diabetes and high blood pressure.  Dental: Annual dental screenings help detect early signs of oral cancer, gum disease, and other conditions linked to overall health, including heart disease and diabetes.  Please see the attached documents for additional preventive care recommendations.   NEXT AWV 06/25/25 @ 9:30 W/ SON

## 2024-07-26 ENCOUNTER — Other Ambulatory Visit: Payer: Self-pay | Admitting: Internal Medicine

## 2024-07-29 NOTE — Telephone Encounter (Signed)
 Requested Prescriptions  Pending Prescriptions Disp Refills   sertraline  (ZOLOFT ) 100 MG tablet [Pharmacy Med Name: SERTRALINE  100MG  TABLETS] 90 tablet 0    Sig: TAKE 1 TABLET(100 MG) BY MOUTH DAILY     Psychiatry:  Antidepressants - SSRI - sertraline  Passed - 07/29/2024  4:04 PM      Passed - AST in normal range and within 360 days    AST  Date Value Ref Range Status  04/27/2024 24 15 - 41 U/L Final   SGOT(AST)  Date Value Ref Range Status  10/19/2011 24 15 - 37 Unit/L Final         Passed - ALT in normal range and within 360 days    ALT  Date Value Ref Range Status  04/27/2024 13 0 - 44 U/L Final   SGPT (ALT)  Date Value Ref Range Status  10/19/2011 27 U/L Final    Comment:    12-78 NOTE: NEW REFERENCE RANGE 06/24/2011          Passed - Completed PHQ-2 or PHQ-9 in the last 360 days      Passed - Valid encounter within last 6 months    Recent Outpatient Visits   None

## 2024-08-01 ENCOUNTER — Other Ambulatory Visit: Payer: Self-pay | Admitting: Internal Medicine

## 2024-08-01 DIAGNOSIS — W5503XA Scratched by cat, initial encounter: Secondary | ICD-10-CM

## 2024-08-02 NOTE — Telephone Encounter (Signed)
 Requested medications are due for refill today.  yes  Requested medications are on the active medications list.  yes  Last refill. 08/14/2023 #90 1 rf  Future visit scheduled.   Yes - in June  Notes to clinic.  Labs are expired.    Requested Prescriptions  Pending Prescriptions Disp Refills   simvastatin  (ZOCOR ) 20 MG tablet [Pharmacy Med Name: SIMVASTATIN  20MG  TABLETS] 90 tablet 1    Sig: TAKE 1 TABLET(20 MG) BY MOUTH DAILY     Cardiovascular:  Antilipid - Statins Failed - 08/02/2024 12:09 PM      Failed - Lipid Panel in normal range within the last 12 months    Cholesterol, Total  Date Value Ref Range Status  01/10/2018 206 (H) 100 - 199 mg/dL Final   Cholesterol  Date Value Ref Range Status  09/16/2022 172 <200 mg/dL Final   LDL Cholesterol (Calc)  Date Value Ref Range Status  09/16/2022 105 (H) mg/dL (calc) Final    Comment:    Reference range: <100 . Desirable range <100 mg/dL for primary prevention;   <70 mg/dL for patients with CHD or diabetic patients  with > or = 2 CHD risk factors. SABRA LDL-C is now calculated using the Martin-Hopkins  calculation, which is a validated novel method providing  better accuracy than the Friedewald equation in the  estimation of LDL-C.  Gladis APPLETHWAITE et al. SANDREA. 7986;689(80): 2061-2068  (http://education.[facility].com/faq/FAQ164)    HDL  Date Value Ref Range Status  09/16/2022 37 (L) > OR = 50 mg/dL Final  93/87/7980 35 (L) >39 mg/dL Final   Triglycerides  Date Value Ref Range Status  09/16/2022 182 (H) <150 mg/dL Final         Passed - Patient is not pregnant      Passed - Valid encounter within last 12 months    Recent Outpatient Visits   None             furosemide  (LASIX ) 20 MG tablet [Pharmacy Med Name: FUROSEMIDE  20MG  TABLETS] 90 tablet 1    Sig: TAKE 1 TABLET(20 MG) BY MOUTH DAILY     Cardiovascular:  Diuretics - Loop Failed - 08/02/2024 12:09 PM      Failed - Ca in normal range and within 180 days     Calcium  Date Value Ref Range Status  04/27/2024 8.7 (L) 8.9 - 10.3 mg/dL Final   Calcium, Total  Date Value Ref Range Status  07/30/2014 8.5 8.5 - 10.1 mg/dL Final         Failed - Mg Level in normal range and within 180 days    Magnesium  Date Value Ref Range Status  02/23/2012 2.3 mg/dL Final    Comment:    8.1-7.5 THERAPEUTIC RANGE: 4-7 mg/dL TOXIC: > 10 mg/dL  -----------------------          Failed - Last BP in normal range    BP Readings from Last 1 Encounters:  06/21/24 (!) 150/92         Passed - K in normal range and within 180 days    Potassium  Date Value Ref Range Status  04/27/2024 3.6 3.5 - 5.1 mmol/L Final  07/30/2014 4.0 3.5 - 5.1 mmol/L Final         Passed - Na in normal range and within 180 days    Sodium  Date Value Ref Range Status  04/27/2024 140 135 - 145 mmol/L Final  09/08/2020 145 (H) 134 - 144 mmol/L Final  07/30/2014 139  136 - 145 mmol/L Final         Passed - Cr in normal range and within 180 days    Creat  Date Value Ref Range Status  09/16/2022 0.95 0.60 - 0.95 mg/dL Final   Creatinine, Ser  Date Value Ref Range Status  04/29/2024 0.47 0.44 - 1.00 mg/dL Final         Passed - Cl in normal range and within 180 days    Chloride  Date Value Ref Range Status  04/27/2024 101 98 - 111 mmol/L Final  07/30/2014 104 98 - 107 mmol/L Final         Passed - Valid encounter within last 6 months    Recent Outpatient Visits   None

## 2025-06-25 ENCOUNTER — Ambulatory Visit
